# Patient Record
Sex: Male | Born: 1935
Health system: Southern US, Community
[De-identification: ages and names within clinical notes are randomized; demographics above are authoritative.]

## PROBLEM LIST (undated history)

## (undated) DIAGNOSIS — M1990 Unspecified osteoarthritis, unspecified site: Secondary | ICD-10-CM

## (undated) DIAGNOSIS — N189 Chronic kidney disease, unspecified: Secondary | ICD-10-CM

## (undated) DIAGNOSIS — E7800 Pure hypercholesterolemia, unspecified: Secondary | ICD-10-CM

## (undated) DIAGNOSIS — I219 Acute myocardial infarction, unspecified: Secondary | ICD-10-CM

## (undated) DIAGNOSIS — Z978 Presence of other specified devices: Secondary | ICD-10-CM

## (undated) DIAGNOSIS — I10 Essential (primary) hypertension: Secondary | ICD-10-CM

## (undated) DIAGNOSIS — K219 Gastro-esophageal reflux disease without esophagitis: Secondary | ICD-10-CM

## (undated) DIAGNOSIS — E039 Hypothyroidism, unspecified: Secondary | ICD-10-CM

## (undated) DIAGNOSIS — K5900 Constipation, unspecified: Secondary | ICD-10-CM

## (undated) HISTORY — PX: CARDIAC CATHETERIZATION: SHX172

## (undated) HISTORY — PX: CORONARY ANGIOPLASTY: SHX604

## (undated) HISTORY — PX: BACK SURGERY: SHX140

## (undated) HISTORY — PX: APPENDECTOMY: SHX54

## (undated) HISTORY — PX: SKIN SURGERY: SHX2413

---

## 1997-05-10 ENCOUNTER — Inpatient Hospital Stay (HOSPITAL_COMMUNITY): Admission: EM | Admit: 1997-05-10 | Discharge: 1997-05-12 | Payer: Self-pay | Admitting: Emergency Medicine

## 2000-12-13 ENCOUNTER — Ambulatory Visit (HOSPITAL_COMMUNITY): Admission: RE | Admit: 2000-12-13 | Discharge: 2000-12-13 | Payer: Self-pay | Admitting: Internal Medicine

## 2007-01-20 DIAGNOSIS — I219 Acute myocardial infarction, unspecified: Secondary | ICD-10-CM

## 2007-01-20 HISTORY — DX: Acute myocardial infarction, unspecified: I21.9

## 2012-11-11 ENCOUNTER — Other Ambulatory Visit: Payer: Self-pay | Admitting: Neurological Surgery

## 2012-11-14 ENCOUNTER — Other Ambulatory Visit: Payer: Self-pay | Admitting: Neurological Surgery

## 2012-11-14 DIAGNOSIS — M48061 Spinal stenosis, lumbar region without neurogenic claudication: Secondary | ICD-10-CM

## 2012-11-15 ENCOUNTER — Ambulatory Visit
Admission: RE | Admit: 2012-11-15 | Discharge: 2012-11-15 | Disposition: A | Discharge status: Home / Self Care | Payer: Medicare Other | Source: Ambulatory Visit | Attending: Neurological Surgery | Admitting: Neurological Surgery

## 2012-11-15 DIAGNOSIS — M48061 Spinal stenosis, lumbar region without neurogenic claudication: Secondary | ICD-10-CM

## 2012-11-23 ENCOUNTER — Encounter (HOSPITAL_COMMUNITY): Payer: Self-pay

## 2012-11-25 ENCOUNTER — Encounter (HOSPITAL_COMMUNITY): Payer: Self-pay

## 2012-11-25 ENCOUNTER — Encounter (HOSPITAL_COMMUNITY)
Admission: RE | Admit: 2012-11-25 | Discharge: 2012-11-25 | Disposition: A | Discharge status: Home / Self Care | Payer: Medicare Other | Source: Ambulatory Visit | Attending: Neurological Surgery | Admitting: Neurological Surgery

## 2012-11-25 ENCOUNTER — Encounter (HOSPITAL_COMMUNITY)
Admission: RE | Admit: 2012-11-25 | Discharge: 2012-11-25 | Disposition: A | Discharge status: Home / Self Care | Payer: Medicare Other | Source: Ambulatory Visit | Attending: Anesthesiology | Admitting: Anesthesiology

## 2012-11-25 DIAGNOSIS — Z01810 Encounter for preprocedural cardiovascular examination: Secondary | ICD-10-CM | POA: Insufficient documentation

## 2012-11-25 DIAGNOSIS — Z01812 Encounter for preprocedural laboratory examination: Secondary | ICD-10-CM | POA: Insufficient documentation

## 2012-11-25 DIAGNOSIS — Z01818 Encounter for other preprocedural examination: Secondary | ICD-10-CM | POA: Insufficient documentation

## 2012-11-25 HISTORY — DX: Essential (primary) hypertension: I10

## 2012-11-25 HISTORY — DX: Acute myocardial infarction, unspecified: I21.9

## 2012-11-25 HISTORY — DX: Hypothyroidism, unspecified: E03.9

## 2012-11-25 HISTORY — DX: Pure hypercholesterolemia, unspecified: E78.00

## 2012-11-25 LAB — SURGICAL PCR SCREEN: Staphylococcus aureus: NEGATIVE

## 2012-11-25 LAB — TYPE AND SCREEN
ABO/RH(D): O POS
Antibody Screen: NEGATIVE

## 2012-11-25 LAB — BASIC METABOLIC PANEL
BUN: 20 mg/dL (ref 6–23)
CO2: 26 mEq/L (ref 19–32)
Chloride: 103 mEq/L (ref 96–112)
Creatinine, Ser: 1.33 mg/dL (ref 0.50–1.35)
Potassium: 4 mEq/L (ref 3.5–5.1)

## 2012-11-25 LAB — CBC
HCT: 44.5 % (ref 39.0–52.0)
Hemoglobin: 15.6 g/dL (ref 13.0–17.0)
MCH: 32.6 pg (ref 26.0–34.0)
MCHC: 35.1 g/dL (ref 30.0–36.0)
MCV: 93.1 fL (ref 78.0–100.0)
Platelets: 241 10*3/uL (ref 150–400)
RBC: 4.78 MIL/uL (ref 4.22–5.81)
RDW: 12.3 % (ref 11.5–15.5)
WBC: 7.6 10*3/uL (ref 4.0–10.5)

## 2012-11-25 LAB — ABO/RH: ABO/RH(D): O POS

## 2012-11-25 NOTE — Pre-Procedure Instructions (Signed)
Ronnie Gregory  11/25/2012   Your procedure is scheduled on:  November 18  Report to [facility] Entrance "A" [address] at [employer] AM.  Call this number if you have problems the morning of surgery: [phone]   Remember:   Do not eat food or drink liquids after midnight.   Take these medicines the morning of surgery with A SIP OF WATER: Levothyroxine, Metoprolol   STOP Fish Oil and Aspirin Tuesday November 11   STOP Aspirin, Aleve, Naproxen, Advil, Ibuprofen, Vitamin, Herbs, or Supplements starting November 11   Do not wear jewelry, make-up or nail polish.  Do not wear lotions, powders, or perfumes. You may wear deodorant.  Do not shave 48 hours prior to surgery. Men may shave face and neck.  Do not bring valuables to the hospital.  [facility] is not responsible                  for any belongings or valuables.               Contacts, dentures or bridgework may not be worn into surgery.  Leave suitcase in the car. After surgery it may be brought to your room.  For patients admitted to the hospital, discharge time is determined by your                treatment team.               Special Instructions: Shower using CHG 2 nights before surgery and the night before surgery.  If you shower the day of surgery use CHG.  Use special wash - you have one bottle of CHG for all showers.  You should use approximately 1/3 of the bottle for each shower.   Please read over the following fact sheets that you were given: Pain Booklet, Coughing and Deep Breathing, Blood Transfusion Information and Surgical Site Infection Prevention

## 2012-11-25 NOTE — Progress Notes (Signed)
Patient told to stop Cascara and take colace starting November 11

## 2012-11-28 NOTE — Progress Notes (Addendum)
Anesthesia chart review:  Patient is a 79 year old male scheduled for L2-3 PLIF on 12/06/12 by Dr. Danielle Dess.    History includes non-smoker, HTN, hypercholesterolemia, hypothyroidism, prior back surgery ~ 18 years ago, CAD/MI s/p angioplasty/stents X 2 ~ 3 years ago in [place].  Cardiologist is Dr. Isaias Cowman in [place], records re-requested but are still pending.  I did call patient this afternoon.  He reports that he has not seen Dr. Larna Daughters is a few years.  He lives in [place], but sees a PCP is [place] Jillyn [place], PA-C ?) on a "regular basis."  Preoperative CXR and labs noted.  EKG on 11/25/12 showed NSR.  Echo on 04/28/10 from [facility]) showed overall normal to hyperdynamic LV systolic function, LVH, no obvious regional wall motion abnormalities, trivial AR, mild MR/TR, trivial PR.  Stress echo on 08/02/10 was negative.     No cardiac cath received from [facility].  Although patient is fairly certain that this is where his two stents were placed. (I'll re-request to be sure.)   He denies chest pain, SOB at rest, new edema.  It was difficult for him to give me any specifics, but he said he could recently do yard work (although rides a Surveyor, mining), walk up a flight of stairs.  I did tell Mr. Enis we would likely need a clearance for this procedure--possible from his PCP but also maybe from cardiology.  I reviewed above with anesthesiologist Dr. Ivin Booty.  He did recommend cardiology preoperative evaluation since it has been probably > 2 years since he has been evaluated.  I'll review any additional cardiology records if received.  I'll notify Shanda Bumps at Dr. Verlee Rossetti office of need for cardiology evaluation.  Velna Ochs [facility] Short Stay Center/Anesthesiology Phone [phone] 11/28/2012 6:12 PM  Addendum: 11/29/2012 4:00 PM Patient has appointment with Dr. Purvis Sheffield with [facility] in [place] on 11/22/12.  I have faxed available records from [facility]  to that office.  I did receive prior cardiac cath and last office note (04/06/11) from Dr. Isaias Cowman.    Cardiac cath on 04/29/10 showed CAD (LAD -30%, LCX no obstructive disease, RCA serial mid and distal > 90% stenosis), overall normal LV systolic function, EF > 50%, inferior hypokinesis, elevated LVEDP. He is s/p DES to RCA 04/2010.   Carotid duplex on 04/10/11 showed 30% bilateral CCC, no significant ICA stenosis, antegrade vertebral flow.  Abdominal ultrasound in 04/2010 showed no evidence of AAA.   Addendum: 12/02/2012 3:25 PM Patient was seen by cardiologist Dr. Purvis Sheffield today. Because patient was asymptomatic and could perform at least 4 METS, no non-invasive testing was felt warranted at this time. He recommended continuing lisinopril, metoprolol, and pravastatin with plans to resume ASA asap post-operatively.  Six month cardiology follow-up is planned.

## 2012-11-30 ENCOUNTER — Encounter: Payer: Self-pay | Admitting: Cardiology

## 2012-11-30 NOTE — Progress Notes (Signed)
[facility] in [place] @[phone] called and pt has appointment for Nov 14th.

## 2012-12-02 ENCOUNTER — Ambulatory Visit (INDEPENDENT_AMBULATORY_CARE_PROVIDER_SITE_OTHER): Payer: Medicare Other | Admitting: Cardiovascular Disease

## 2012-12-02 VITALS — BP 153/88 | HR 86 | Ht 70.0 in | Wt 195.1 lb

## 2012-12-02 DIAGNOSIS — E785 Hyperlipidemia, unspecified: Secondary | ICD-10-CM

## 2012-12-02 DIAGNOSIS — I2510 Atherosclerotic heart disease of native coronary artery without angina pectoris: Secondary | ICD-10-CM

## 2012-12-02 DIAGNOSIS — Z955 Presence of coronary angioplasty implant and graft: Secondary | ICD-10-CM | POA: Insufficient documentation

## 2012-12-02 DIAGNOSIS — Z9861 Coronary angioplasty status: Secondary | ICD-10-CM

## 2012-12-02 DIAGNOSIS — I10 Essential (primary) hypertension: Secondary | ICD-10-CM

## 2012-12-02 DIAGNOSIS — Z01818 Encounter for other preprocedural examination: Secondary | ICD-10-CM

## 2012-12-02 NOTE — Patient Instructions (Addendum)
   Resume Pravastatin, Lisinopril, & Metoprolol at your present dose today Continue all other medications.    Continue to hold Aspirin for now & resume as soon as surgeon advises after surgery Your physician wants you to follow up in: 6 months.  You will receive a reminder letter in the mail one-two months in advance.  If you don't receive a letter, please call our office to schedule the follow up appointment

## 2012-12-02 NOTE — Progress Notes (Signed)
Patient ID: Ronnie Gregory, male   DOB: 1933/10/04, 79 y.o.   MRN: [id number]       CARDIOLOGY CONSULT NOTE  Patient ID: Ronnie Gregory MRN: [id number] DOB/AGE: Dec 28, 1933 79 y.o.  Admit date: (Not on file) Primary Physician No primary provider on file.  Reason for Consultation: CAD, preop clearance  HPI: The patient is a 79 year old man with a history of coronary artery disease who reportedly had 2 stents placed approximately 2.5 years ago. He underwent a negative stress echocardiogram and walked for 6 minutes on the Bruce protocol achieving 7 METS on 08/02/2010. Cardiac catheterization report from 04/29/2010 revealed a moderate sized LAD with proximal calcification and mild diffuse nonobstructive disease up to 30%. The left circumflex coronary artery was moderate in size with no obstructive stenosis. The RCA was a large dominant vessel with a long subtotal 90% stenosis in the mid vessel stenosis and a focal distal subtotal 90% stenosis. There appeared to be competitive flow from left-sided collaterals. Left ventricular systolic function was reportedly normal with inferior hypokinesis. An echocardiogram performed on 04/28/2010 showed left into a systolic function to be normal to hyperdynamic with evidence of left ventricular hypertrophy and mild mitral and tricuspid regurgitation with no regional wall motion abnormalities.   He is apparently being scheduled for an L2-3 posterior lumbar interbody fusion to be performed on 12/06/2012. He has a history of a previous decompression and fusion from L3 down to L5.  He had been taking aspirin, lisinopril, metoprolol and pravastatin, but he was apparently instructed to stop all of his medications and thus he is not taking anything at this present time. His heart rate is 86 beats per minute and his blood pressure is 153/88 mmHg.  He denies chest pain, palpitations and shortness of breath. He also denies leg swelling and syncope. He remains very  active at home and splits wood. He used to be a Curator for a living.  He has 16 stairs at home and climbs them 5-6 times a day without experiencing any chest pain, shortness of breath or palpitations.   No Known Allergies  Current Outpatient Prescriptions  Medication Sig Dispense Refill  . aspirin EC 81 MG tablet Take 81 mg by mouth daily.      . CASCARA SAGRADA PO Take 1 tablet by mouth at bedtime.      Marland Kitchen levothyroxine (SYNTHROID, LEVOTHROID) 100 MCG tablet Take 100 mcg by mouth daily before breakfast.      . lisinopril (PRINIVIL,ZESTRIL) 2.5 MG tablet Take 2.5 mg by mouth daily.      . metoprolol tartrate (LOPRESSOR) 25 MG tablet Take 12.5 mg by mouth 2 (two) times daily.      . Omega-3 Fatty Acids (FISH OIL) 600 MG CAPS Take 1,800 mg by mouth daily.      . pravastatin (PRAVACHOL) 40 MG tablet Take 40 mg by mouth at bedtime.       No current facility-administered medications for this visit.    Past Medical History  Diagnosis Date  . Hypertension   . Myocardial infarction     Dr. Caryn Section cardiologist  . Hypothyroidism   . Hypercholesteremia     Past Surgical History  Procedure Laterality Date  . Cardiac catheterization    . Coronary angioplasty      3 years ago  . Back surgery      x 3    History   Social History  . Marital Status: Married    Spouse Name: N/A  Number of Children: N/A  . Years of Education: N/A   Occupational History  . Not on file.   Social History Main Topics  . Smoking status: Never Smoker   . Smokeless tobacco: Not on file  . Alcohol Use: No  . Drug Use: No  . Sexual Activity: Not on file   Other Topics Concern  . Not on file   Social History Narrative  . No narrative on file     No family history on file.   Prior to Admission medications   Medication Sig Start Date End Date Taking? Authorizing Provider  aspirin EC 81 MG tablet Take 81 mg by mouth daily.    Historical Provider, MD  CASCARA SAGRADA PO Take 1 tablet  by mouth at bedtime.    Historical Provider, MD  levothyroxine (SYNTHROID, LEVOTHROID) 100 MCG tablet Take 100 mcg by mouth daily before breakfast.    Historical Provider, MD  lisinopril (PRINIVIL,ZESTRIL) 2.5 MG tablet Take 2.5 mg by mouth daily.    Historical Provider, MD  metoprolol tartrate (LOPRESSOR) 25 MG tablet Take 12.5 mg by mouth 2 (two) times daily.    Historical Provider, MD  Omega-3 Fatty Acids (FISH OIL) 600 MG CAPS Take 1,800 mg by mouth daily.    Historical Provider, MD  pravastatin (PRAVACHOL) 40 MG tablet Take 40 mg by mouth at bedtime.    Historical Provider, MD     Review of systems complete and found to be negative unless listed above in HPI     Physical exam  General: NAD Neck: No JVD, no thyromegaly or thyroid nodule.  Lungs: Clear to auscultation bilaterally with normal respiratory effort. CV: Nondisplaced PMI.  Heart regular S1/S2, no S3/S4, no murmur.  No peripheral edema.  No carotid bruit.  Normal pedal pulses.  Abdomen: Soft, nontender, no hepatosplenomegaly, no distention.  Skin: Intact without lesions or rashes.  Neurologic: Alert and oriented x 3.  Psych: Normal affect. Extremities: No clubbing or cyanosis.  HEENT: Normal.   Labs:   Lab Results  Component Value Date   WBC 7.6 11/25/2012   HGB 15.6 11/25/2012   HCT 44.5 11/25/2012   MCV 93.1 11/25/2012   PLT 241 11/25/2012    Recent Labs Lab 11/25/12 1422  NA 138  K 4.0  CL 103  CO2 26  BUN 20  CREATININE 1.33  CALCIUM 9.2  GLUCOSE 96   No results found for this basename: CKTOTAL, CKMB, CKMBINDEX, TROPONINI    No results found for this basename: CHOL   No results found for this basename: HDL   No results found for this basename: LDLCALC   No results found for this basename: TRIG   No results found for this basename: CHOLHDL   No results found for this basename: LDLDIRECT       EKG: Sinus rhythm, rate 66 bpm, axis within normal limits, intervals within normal limits, no acute  ST-T wave changes.  Studies: No results found.  ASSESSMENT AND PLAN: 1. Preoperative risk stratification: given that he is asymptomatic with regular exertion, including climbing stairs several times a day, it appears he is able to perform between 4-10 METS. Thus, his likelihood of experiencing a major adverse cardiovascular event in the perioperative period is probably low. However I would recommend that he commence taking lisinopril, metoprolol, and pravastatin today. While his aspirin has been held, I would strongly encourage him to begin taking it immediately after surgery. I would prefer that he take it throughout the perioperative period  given the fact that he has multiple stents. The purpose of metoprolol is specifically to reduce his risk for a perioperative myocardial infarction or ventricular dysrhythmia. The purpose of the pravastatin is for its pleiotropic effects and thus attenuating endothelial dysfunction. His lisinopril should be maintained to control his blood pressure, as it is mildly elevated today. I do not feel any noninvasive testing is warranted at this time. 2. CAD with stents: would continue ASA, metoprolol, pravastatin, and lisinopril as mentioned above. 3. Hypertension: elevated today. Would restart lisinopril and metoprolol. 4. Hyperlipidemia: on pravastatin. Will need a lipid panel in the future. I will check to see if one has been recently completed.  Dispo: f/u 6 months.  Signed: Prentice Docker, M.D., F.A.C.C.  12/02/2012, 1:50 PM

## 2012-12-05 MED ORDER — CEFAZOLIN SODIUM-DEXTROSE 2-3 GM-% IV SOLR
2.0000 g | INTRAVENOUS | Status: AC
  Administered 2012-12-06: 2 g via INTRAVENOUS
  Filled 2012-12-05: qty 50

## 2012-12-05 NOTE — H&P (Signed)
CHIEF COMPLAINT:                                          Back pain and leg discomfort when he walks.  HISTORY OF PRESENT ILLNESS:                     [name] who I had seen 17 years ago.  He underwent decompression and fusion for severe stenosis at L3-L4.  The patient did quite well from that and I have not seen him in a number of years, but he notes that in the past couple of years, he has had progressive increase in back pain and difficulty with walking.  He notes that he cannot enjoy walking like he used to because he has increasing pain.  His daughter, who accompanies him notes that he has had a progressive decrease in function such that he has great difficulty even doing activities of daily living or any traveling or about to do the shopping for his family.  He is involved in the care of his wife who has some Alzheimer's disease and he has been becoming increasingly debilitated by this process.  He had seen some specialists in [place] and apparently had an MRI.  He also had an MRI of his lumbar spine in October 2013 at [facility].  I obtained that study on the [facility] and it demonstrates that he has a solid fusion at L3-L4 with a widely patent spinal canal from L3 on down, but at L2-L3, he has severe stenosis secondary to a combination of spondylosis and marked hypertrophy of the facet joints and bulging of the disc in the posterior aspect.  This causes focal severe central stenosis.  He has had physical therapy in [place], which had been of little use in regards to his back.  REVIEW OF SYSTEMS:                                    Notable for wearing of glasses, leg pain, back pain, leg weakness on a 14-point review sheet.  PAST MEDICAL HISTORY:    Current Medical Conditions:  Reveals his general health has been good.  He does have some hypertension.    Prior Operations:  He had a stent placed a couple of years ago in his  heart.    Medications and Allergies:  Currently, he is taking metoprolol, lisinopril, levothyroxine, aspirin, fish oil supplement, and pravastatin.  He also uses baby aspirin per day.  FAMILY HISTORY:                                            Noncontributory.  SOCIAL HISTORY:                                            Reveals he does not smoke or use alcohol.  PHYSICAL EXAMINATION:  Height and weight have been stable at 5 feet 9 inches and 175 pounds.  On physical examination, I note that he stands straight and erect, but initially he tends to favor a 5 to 10-degree forward stoop.  He walks about very slowly and cautiously and as he walks progressively, his stride increases and the length of his gait increases and the speed of his gait also increases.  After a while, he is able to balance on to his toes and on to his heels without difficulty.  Deep tendon reflexes are trace in the patellae and absent in both Achilles.  Sensation appears grossly intact.  IMPRESSION/PLAN:                                         At this point, I believe that the patient has severe stenosis at L2-3 above his previous fusion.  I have advised that he ultimately needs to undergo surgical decompression.  With regard to further conservative management, he has had a trial of a couple of steroid injections, but these provided minimal relief.  I do not believe that physical therapy is going to improve this condition.  The patient does have adjacent level disease at the L2-3 level above his fusion and I believe that he would be best served with decompression and fusion at L2-L3 with removal of the hardware at L3-4.  new MRI confirms the presence of severe stenosis at L2-L3. He is admitted for surgery now

## 2012-12-06 ENCOUNTER — Inpatient Hospital Stay (HOSPITAL_COMMUNITY)
Admission: RE | Admit: 2012-12-06 | Discharge: 2012-12-09 | DRG: 460 | Disposition: A | Discharge status: Home / Self Care | Payer: Medicare Other | Source: Ambulatory Visit | Attending: Neurological Surgery | Admitting: Neurological Surgery

## 2012-12-06 ENCOUNTER — Encounter (HOSPITAL_COMMUNITY): Payer: Medicare Other | Admitting: Vascular Surgery

## 2012-12-06 ENCOUNTER — Encounter (HOSPITAL_COMMUNITY): Payer: Self-pay | Admitting: Surgery

## 2012-12-06 ENCOUNTER — Inpatient Hospital Stay (HOSPITAL_COMMUNITY): Payer: Medicare Other | Admitting: Certified Registered"

## 2012-12-06 ENCOUNTER — Inpatient Hospital Stay (HOSPITAL_COMMUNITY): Payer: Medicare Other

## 2012-12-06 ENCOUNTER — Encounter (HOSPITAL_COMMUNITY)
Admission: RE | Disposition: A | Discharge status: Home / Self Care | Payer: Medicare Other | Source: Ambulatory Visit | Attending: Neurological Surgery

## 2012-12-06 DIAGNOSIS — Z79899 Other long term (current) drug therapy: Secondary | ICD-10-CM

## 2012-12-06 DIAGNOSIS — M47817 Spondylosis without myelopathy or radiculopathy, lumbosacral region: Principal | ICD-10-CM | POA: Diagnosis present

## 2012-12-06 DIAGNOSIS — Z981 Arthrodesis status: Secondary | ICD-10-CM

## 2012-12-06 DIAGNOSIS — E039 Hypothyroidism, unspecified: Secondary | ICD-10-CM | POA: Diagnosis present

## 2012-12-06 DIAGNOSIS — M4716 Other spondylosis with myelopathy, lumbar region: Secondary | ICD-10-CM

## 2012-12-06 DIAGNOSIS — I252 Old myocardial infarction: Secondary | ICD-10-CM

## 2012-12-06 DIAGNOSIS — I10 Essential (primary) hypertension: Secondary | ICD-10-CM | POA: Diagnosis present

## 2012-12-06 DIAGNOSIS — Z7982 Long term (current) use of aspirin: Secondary | ICD-10-CM

## 2012-12-06 DIAGNOSIS — Z472 Encounter for removal of internal fixation device: Secondary | ICD-10-CM

## 2012-12-06 DIAGNOSIS — I2510 Atherosclerotic heart disease of native coronary artery without angina pectoris: Secondary | ICD-10-CM | POA: Diagnosis present

## 2012-12-06 DIAGNOSIS — Z9861 Coronary angioplasty status: Secondary | ICD-10-CM

## 2012-12-06 SURGERY — POSTERIOR LUMBAR FUSION 1 LEVEL
Anesthesia: General | Site: Back | Wound class: Clean

## 2012-12-06 MED ORDER — ACETAMINOPHEN 325 MG PO TABS
650.0000 mg | ORAL_TABLET | ORAL | Status: DC | PRN
  Administered 2012-12-07: 650 mg via ORAL
  Filled 2012-12-06: qty 2

## 2012-12-06 MED ORDER — MORPHINE SULFATE 2 MG/ML IJ SOLN
1.0000 mg | INTRAMUSCULAR | Status: DC | PRN
  Filled 2012-12-06: qty 1

## 2012-12-06 MED ORDER — SODIUM CHLORIDE 0.9 % IV SOLN
INTRAVENOUS | Status: DC | PRN
  Administered 2012-12-06: 13:00:00 via INTRAVENOUS

## 2012-12-06 MED ORDER — FENTANYL CITRATE 0.05 MG/ML IJ SOLN
INTRAMUSCULAR | Status: DC | PRN
  Administered 2012-12-06: 100 ug via INTRAVENOUS
  Administered 2012-12-06: 25 ug via INTRAVENOUS

## 2012-12-06 MED ORDER — DEXTROSE 5 % IV SOLN
500.0000 mg | Freq: Four times a day (QID) | INTRAVENOUS | Status: DC | PRN
  Administered 2012-12-06: 500 mg via INTRAVENOUS
  Filled 2012-12-06: qty 5

## 2012-12-06 MED ORDER — NEOSTIGMINE METHYLSULFATE 1 MG/ML IJ SOLN
INTRAMUSCULAR | Status: DC | PRN
  Administered 2012-12-06: 5 mg via INTRAVENOUS

## 2012-12-06 MED ORDER — SIMVASTATIN 20 MG PO TABS
20.0000 mg | ORAL_TABLET | Freq: Every day | ORAL | Status: DC
  Administered 2012-12-07 – 2012-12-08 (×2): 20 mg via ORAL
  Filled 2012-12-06 (×3): qty 1

## 2012-12-06 MED ORDER — BISACODYL 10 MG RE SUPP
10.0000 mg | Freq: Every day | RECTAL | Status: DC | PRN
  Administered 2012-12-08: 10 mg via RECTAL
  Filled 2012-12-06: qty 1

## 2012-12-06 MED ORDER — ONDANSETRON HCL 4 MG/2ML IJ SOLN: 4.0000 mg | Freq: Four times a day (QID) | INTRAMUSCULAR | Status: DC | PRN

## 2012-12-06 MED ORDER — POLYETHYLENE GLYCOL 3350 17 G PO PACK
17.0000 g | PACK | Freq: Every day | ORAL | Status: DC | PRN
  Filled 2012-12-06: qty 1

## 2012-12-06 MED ORDER — 0.9 % SODIUM CHLORIDE (POUR BTL) OPTIME
TOPICAL | Status: DC | PRN
  Administered 2012-12-06: 1000 mL

## 2012-12-06 MED ORDER — PROPOFOL 10 MG/ML IV BOLUS
INTRAVENOUS | Status: DC | PRN
  Administered 2012-12-06: 160 mg via INTRAVENOUS

## 2012-12-06 MED ORDER — OXYCODONE HCL 5 MG PO TABS
ORAL_TABLET | ORAL | Status: AC
  Administered 2012-12-06: 5 mg
  Filled 2012-12-06: qty 1

## 2012-12-06 MED ORDER — PHENOL 1.4 % MT LIQD: 1.0000 | OROMUCOSAL | Status: DC | PRN

## 2012-12-06 MED ORDER — SENNA 8.6 MG PO TABS
1.0000 | ORAL_TABLET | Freq: Two times a day (BID) | ORAL | Status: DC
  Administered 2012-12-06 – 2012-12-09 (×6): 8.6 mg via ORAL
  Filled 2012-12-06 (×7): qty 1

## 2012-12-06 MED ORDER — HYDROMORPHONE HCL PF 1 MG/ML IJ SOLN
0.2500 mg | INTRAMUSCULAR | Status: DC | PRN
  Administered 2012-12-06 (×2): 0.5 mg via INTRAVENOUS

## 2012-12-06 MED ORDER — LEVOTHYROXINE SODIUM 100 MCG PO TABS
100.0000 ug | ORAL_TABLET | Freq: Every day | ORAL | Status: DC
  Administered 2012-12-07 – 2012-12-09 (×3): 100 ug via ORAL
  Filled 2012-12-06 (×5): qty 1

## 2012-12-06 MED ORDER — ARTIFICIAL TEARS OP OINT
TOPICAL_OINTMENT | OPHTHALMIC | Status: DC | PRN
  Administered 2012-12-06: 1 via OPHTHALMIC

## 2012-12-06 MED ORDER — THROMBIN 20000 UNITS EX SOLR
CUTANEOUS | Status: DC | PRN
  Administered 2012-12-06: 10:00:00 via TOPICAL

## 2012-12-06 MED ORDER — ACETAMINOPHEN 650 MG RE SUPP: 650.0000 mg | RECTAL | Status: DC | PRN

## 2012-12-06 MED ORDER — BUPIVACAINE HCL (PF) 0.5 % IJ SOLN
INTRAMUSCULAR | Status: DC | PRN
  Administered 2012-12-06: 10 mL

## 2012-12-06 MED ORDER — PHENYLEPHRINE HCL 10 MG/ML IJ SOLN
10.0000 mg | INTRAVENOUS | Status: DC | PRN
  Administered 2012-12-06: 20 ug/min via INTRAVENOUS

## 2012-12-06 MED ORDER — ALUM & MAG HYDROXIDE-SIMETH 200-200-20 MG/5ML PO SUSP
30.0000 mL | Freq: Four times a day (QID) | ORAL | Status: DC | PRN
  Administered 2012-12-08: 30 mL via ORAL
  Filled 2012-12-06: qty 30

## 2012-12-06 MED ORDER — OXYCODONE HCL 5 MG/5ML PO SOLN: 5.0000 mg | Freq: Once | ORAL | Status: DC | PRN

## 2012-12-06 MED ORDER — CEFAZOLIN SODIUM 1-5 GM-% IV SOLN
1.0000 g | Freq: Three times a day (TID) | INTRAVENOUS | Status: AC
  Administered 2012-12-06 – 2012-12-07 (×2): 1 g via INTRAVENOUS
  Filled 2012-12-06 (×3): qty 50

## 2012-12-06 MED ORDER — SODIUM CHLORIDE 0.9 % IR SOLN
Status: DC | PRN
  Administered 2012-12-06: 10:00:00

## 2012-12-06 MED ORDER — GLYCOPYRROLATE 0.2 MG/ML IJ SOLN
INTRAMUSCULAR | Status: DC | PRN
  Administered 2012-12-06: .8 mg via INTRAVENOUS

## 2012-12-06 MED ORDER — ONDANSETRON HCL 4 MG/2ML IJ SOLN
4.0000 mg | INTRAMUSCULAR | Status: DC | PRN
  Administered 2012-12-08 – 2012-12-09 (×2): 4 mg via INTRAVENOUS
  Filled 2012-12-06 (×2): qty 2

## 2012-12-06 MED ORDER — CASCARA SAGRADA 1 GM/ML PO EXTR: Freq: Every day | ORAL | Status: DC

## 2012-12-06 MED ORDER — SODIUM CHLORIDE 0.9 % IJ SOLN
3.0000 mL | Freq: Two times a day (BID) | INTRAMUSCULAR | Status: DC
  Administered 2012-12-06 – 2012-12-09 (×4): 3 mL via INTRAVENOUS

## 2012-12-06 MED ORDER — LISINOPRIL 2.5 MG PO TABS
2.5000 mg | ORAL_TABLET | Freq: Every day | ORAL | Status: DC
  Administered 2012-12-08 – 2012-12-09 (×2): 2.5 mg via ORAL
  Filled 2012-12-06 (×3): qty 1

## 2012-12-06 MED ORDER — SODIUM CHLORIDE 0.9 % IJ SOLN: 3.0000 mL | INTRAMUSCULAR | Status: DC | PRN

## 2012-12-06 MED ORDER — HYDROMORPHONE HCL PF 1 MG/ML IJ SOLN
INTRAMUSCULAR | Status: AC
  Filled 2012-12-06: qty 1

## 2012-12-06 MED ORDER — EPHEDRINE SULFATE 50 MG/ML IJ SOLN
INTRAMUSCULAR | Status: DC | PRN
  Administered 2012-12-06: 5 mg via INTRAVENOUS
  Administered 2012-12-06: 10 mg via INTRAVENOUS
  Administered 2012-12-06: 15 mg via INTRAVENOUS
  Administered 2012-12-06 (×2): 10 mg via INTRAVENOUS

## 2012-12-06 MED ORDER — VECURONIUM BROMIDE 10 MG IV SOLR
INTRAVENOUS | Status: DC | PRN
  Administered 2012-12-06 (×4): 2 mg via INTRAVENOUS

## 2012-12-06 MED ORDER — DOCUSATE SODIUM 100 MG PO CAPS
100.0000 mg | ORAL_CAPSULE | Freq: Two times a day (BID) | ORAL | Status: DC
  Administered 2012-12-06 – 2012-12-09 (×4): 100 mg via ORAL
  Filled 2012-12-06 (×5): qty 1

## 2012-12-06 MED ORDER — ALBUMIN HUMAN 5 % IV SOLN
INTRAVENOUS | Status: DC | PRN
  Administered 2012-12-06: 12:00:00 via INTRAVENOUS

## 2012-12-06 MED ORDER — METOPROLOL TARTRATE 12.5 MG HALF TABLET
12.5000 mg | ORAL_TABLET | Freq: Two times a day (BID) | ORAL | Status: DC
  Administered 2012-12-06 – 2012-12-09 (×5): 12.5 mg via ORAL
  Filled 2012-12-06 (×7): qty 1

## 2012-12-06 MED ORDER — KETOROLAC TROMETHAMINE 15 MG/ML IJ SOLN
15.0000 mg | Freq: Four times a day (QID) | INTRAMUSCULAR | Status: AC
  Administered 2012-12-06 – 2012-12-07 (×5): 15 mg via INTRAVENOUS
  Filled 2012-12-06 (×6): qty 1

## 2012-12-06 MED ORDER — LIDOCAINE-EPINEPHRINE 1 %-1:100000 IJ SOLN
INTRAMUSCULAR | Status: DC | PRN
  Administered 2012-12-06: 10 mL

## 2012-12-06 MED ORDER — ROCURONIUM BROMIDE 100 MG/10ML IV SOLN
INTRAVENOUS | Status: DC | PRN
  Administered 2012-12-06: 50 mg via INTRAVENOUS

## 2012-12-06 MED ORDER — OXYCODONE HCL 5 MG PO TABS
ORAL_TABLET | ORAL | Status: AC
  Filled 2012-12-06: qty 1

## 2012-12-06 MED ORDER — MENTHOL 3 MG MT LOZG
1.0000 | LOZENGE | OROMUCOSAL | Status: DC | PRN
  Administered 2012-12-09: 3 mg via ORAL

## 2012-12-06 MED ORDER — FLEET ENEMA 7-19 GM/118ML RE ENEM: 1.0000 | ENEMA | Freq: Once | RECTAL | Status: AC | PRN

## 2012-12-06 MED ORDER — ONDANSETRON HCL 4 MG/2ML IJ SOLN
INTRAMUSCULAR | Status: DC | PRN
  Administered 2012-12-06: 4 mg via INTRAVENOUS

## 2012-12-06 MED ORDER — SODIUM CHLORIDE 0.9 % IV SOLN: 250.0000 mL | INTRAVENOUS | Status: DC

## 2012-12-06 MED ORDER — LACTATED RINGERS IV SOLN
INTRAVENOUS | Status: DC
  Administered 2012-12-06 (×2): via INTRAVENOUS

## 2012-12-06 MED ORDER — OXYCODONE-ACETAMINOPHEN 5-325 MG PO TABS
1.0000 | ORAL_TABLET | ORAL | Status: DC | PRN
  Administered 2012-12-07: 2 via ORAL
  Administered 2012-12-07 (×2): 1 via ORAL
  Administered 2012-12-08: 2 via ORAL
  Filled 2012-12-06: qty 2
  Filled 2012-12-06: qty 1
  Filled 2012-12-06 (×2): qty 2

## 2012-12-06 MED ORDER — SODIUM CHLORIDE 0.9 % IV SOLN
INTRAVENOUS | Status: DC
  Administered 2012-12-06: 1000 mL via INTRAVENOUS
  Administered 2012-12-07: 09:00:00 via INTRAVENOUS

## 2012-12-06 MED ORDER — OXYCODONE HCL 5 MG PO TABS: 5.0000 mg | ORAL_TABLET | Freq: Once | ORAL | Status: DC | PRN

## 2012-12-06 MED ORDER — METHOCARBAMOL 500 MG PO TABS
500.0000 mg | ORAL_TABLET | Freq: Four times a day (QID) | ORAL | Status: DC | PRN
  Administered 2012-12-07 – 2012-12-09 (×5): 500 mg via ORAL
  Filled 2012-12-06 (×6): qty 1

## 2012-12-06 MED ORDER — LIDOCAINE HCL (CARDIAC) 20 MG/ML IV SOLN
INTRAVENOUS | Status: DC | PRN
  Administered 2012-12-06: 80 mg via INTRAVENOUS

## 2012-12-06 SURGICAL SUPPLY — 61 items
ADH SKN CLS APL DERMABOND .7 (GAUZE/BANDAGES/DRESSINGS) ×1
BAG DECANTER FOR FLEXI CONT (MISCELLANEOUS) ×2 IMPLANT
BLADE SURG ROTATE 9660 (MISCELLANEOUS) IMPLANT
BUR MATCHSTICK NEURO 3.0 LAGG (BURR) ×2 IMPLANT
CANISTER SUCT 3000ML (MISCELLANEOUS) ×2 IMPLANT
CONT SPEC 4OZ CLIKSEAL STRL BL (MISCELLANEOUS) ×4 IMPLANT
COVER BACK TABLE 24X17X13 BIG (DRAPES) IMPLANT
COVER TABLE BACK 60X90 (DRAPES) ×2 IMPLANT
DECANTER SPIKE VIAL GLASS SM (MISCELLANEOUS) ×2 IMPLANT
DERMABOND ADVANCED (GAUZE/BANDAGES/DRESSINGS) ×1
DERMABOND ADVANCED .7 DNX12 (GAUZE/BANDAGES/DRESSINGS) ×1 IMPLANT
DRAPE C-ARM 42X72 X-RAY (DRAPES) ×4 IMPLANT
DRAPE LAPAROTOMY 100X72X124 (DRAPES) ×2 IMPLANT
DRAPE POUCH INSTRU U-SHP 10X18 (DRAPES) ×2 IMPLANT
DRAPE PROXIMA HALF (DRAPES) IMPLANT
DURAPREP 26ML APPLICATOR (WOUND CARE) ×2 IMPLANT
ELECT REM PT RETURN 9FT ADLT (ELECTROSURGICAL) ×2
ELECTRODE REM PT RTRN 9FT ADLT (ELECTROSURGICAL) ×1 IMPLANT
GAUZE SPONGE 4X4 16PLY XRAY LF (GAUZE/BANDAGES/DRESSINGS) IMPLANT
GLOVE BIOGEL PI IND STRL 8.5 (GLOVE) ×2 IMPLANT
GLOVE BIOGEL PI INDICATOR 8.5 (GLOVE) ×2
GLOVE ECLIPSE 8.0 STRL XLNG CF (GLOVE) ×1 IMPLANT
GLOVE ECLIPSE 8.5 STRL (GLOVE) ×4 IMPLANT
GLOVE EXAM NITRILE LRG STRL (GLOVE) ×1 IMPLANT
GLOVE EXAM NITRILE MD LF STRL (GLOVE) IMPLANT
GLOVE EXAM NITRILE XL STR (GLOVE) IMPLANT
GLOVE EXAM NITRILE XS STR PU (GLOVE) IMPLANT
GLOVE INDICATOR 7.0 STRL GRN (GLOVE) ×1 IMPLANT
GLOVE SURG SS PI 6.5 STRL IVOR (GLOVE) ×4 IMPLANT
GOWN BRE IMP SLV AUR LG STRL (GOWN DISPOSABLE) ×1 IMPLANT
GOWN BRE IMP SLV AUR XL STRL (GOWN DISPOSABLE) ×1 IMPLANT
GOWN STRL REIN 2XL LVL4 (GOWN DISPOSABLE) ×4 IMPLANT
HEMOSTAT POWDER KIT SURGIFOAM (HEMOSTASIS) IMPLANT
KIT BASIN OR (CUSTOM PROCEDURE TRAY) ×2 IMPLANT
KIT ROOM TURNOVER OR (KITS) ×2 IMPLANT
MILL MEDIUM DISP (BLADE) ×1 IMPLANT
NEEDLE HYPO 22GX1.5 SAFETY (NEEDLE) ×2 IMPLANT
NS IRRIG 1000ML POUR BTL (IV SOLUTION) ×2 IMPLANT
PACK FOAM VITOSS 10CC (Orthopedic Implant) ×1 IMPLANT
PACK LAMINECTOMY NEURO (CUSTOM PROCEDURE TRAY) ×2 IMPLANT
PAD ARMBOARD 7.5X6 YLW CONV (MISCELLANEOUS) ×6 IMPLANT
PATTIES SURGICAL .5 X1 (DISPOSABLE) ×2 IMPLANT
PEEK PLIF NOVEL 9X25X12 (Peek) ×2 IMPLANT
ROD 45.5X40CM (Rod) ×2 IMPLANT
SCREW 40MM (Screw) ×4 IMPLANT
SCREW SET SPINAL STD HEXALOBE (Screw) ×4 IMPLANT
SPONGE GAUZE 4X4 12PLY (GAUZE/BANDAGES/DRESSINGS) ×2 IMPLANT
SPONGE LAP 4X18 X RAY DECT (DISPOSABLE) IMPLANT
SPONGE SURGIFOAM ABS GEL 100 (HEMOSTASIS) ×2 IMPLANT
SUT VIC AB 1 CT1 18XBRD ANBCTR (SUTURE) ×1 IMPLANT
SUT VIC AB 1 CT1 8-18 (SUTURE) ×2
SUT VIC AB 2-0 CP2 18 (SUTURE) ×3 IMPLANT
SUT VIC AB 3-0 SH 8-18 (SUTURE) ×3 IMPLANT
SYR 20ML ECCENTRIC (SYRINGE) ×2 IMPLANT
SYR 3ML LL SCALE MARK (SYRINGE) ×8 IMPLANT
TOWEL OR 17X24 6PK STRL BLUE (TOWEL DISPOSABLE) ×2 IMPLANT
TOWEL OR 17X26 10 PK STRL BLUE (TOWEL DISPOSABLE) ×2 IMPLANT
TRAP SPECIMEN MUCOUS 40CC (MISCELLANEOUS) ×2 IMPLANT
TRAY FOLEY CATH 14FRSI W/METER (CATHETERS) ×1 IMPLANT
TRAY FOLEY CATH 16FRSI W/METER (SET/KITS/TRAYS/PACK) ×1 IMPLANT
WATER STERILE IRR 1000ML POUR (IV SOLUTION) ×2 IMPLANT

## 2012-12-06 NOTE — Progress Notes (Signed)
PHARMACIST - PHYSICIAN ORDER COMMUNICATION  CONCERNING: P&T Medication Policy on Herbal Medications  DESCRIPTION:  This patient's order for:  Ronnie Gregory  has been noted.  This product(s) is classified as an "herbal" or natural product. Due to a lack of definitive safety studies or FDA approval, nonstandard manufacturing practices, plus the potential risk of unknown drug-drug interactions while on inpatient medications, the Pharmacy and Therapeutics Committee does not permit the use of "herbal" or natural products of this type within [facility].   ACTION TAKEN: The pharmacy department is unable to verify this order at this time and your patient has been informed of this safety policy. Please reevaluate patient's clinical condition at discharge and address if the herbal or natural product(s) should be resumed at that time.  Shelba Flake Achilles Dunk, PharmD Clinical Pharmacist - Resident Pager: [phone] Pharmacy: [phone] 12/06/2012 7:19 PM

## 2012-12-06 NOTE — Anesthesia Procedure Notes (Signed)
Procedure Name: Intubation Date/Time: 12/06/2012 10:22 AM Performed by: Jefm Miles E Pre-anesthesia Checklist: Patient identified, Emergency Drugs available, Suction available, Patient being monitored and Timeout performed Patient Re-evaluated:Patient Re-evaluated prior to inductionOxygen Delivery Method: Circle system utilized Preoxygenation: Pre-oxygenation with 100% oxygen Intubation Type: IV induction Ventilation: Mask ventilation without difficulty Laryngoscope Size: Mac and 3 Grade View: Grade I Tube type: Oral Tube size: 7.5 mm Number of attempts: 1 Airway Equipment and Method: Stylet Placement Confirmation: ETT inserted through vocal cords under direct vision,  positive ETCO2 and breath sounds checked- equal and bilateral Secured at: 23 cm Tube secured with: Tape Dental Injury: Teeth and Oropharynx as per pre-operative assessment

## 2012-12-06 NOTE — Progress Notes (Signed)
Patient ID: Ronnie Gregory, male   DOB: 10-Jun-1933, 79 y.o.   MRN: [id number] Awake resting comfortably moves both lower extremities well neuro stable.

## 2012-12-06 NOTE — Anesthesia Postprocedure Evaluation (Signed)
Anesthesia Post Note  Patient: Ronnie Gregory  Procedure(s) Performed: Procedure(s) (LRB):  POSTERIOR LUMBAR INTERBODY  FUSION 1 LEVEL LUMBAR TWO -THREE (N/A)  Anesthesia type: General  Patient location: PACU  Post pain: Pain level controlled and Adequate analgesia  Post assessment: Post-op Vital signs reviewed, Patient's Cardiovascular Status Stable, Respiratory Function Stable, Patent Airway and Pain level controlled  Last Vitals:  Filed Vitals:   12/06/12 1337  BP: 149/77  Pulse: 78  Temp: 36.1 C  Resp: 19    Post vital signs: Reviewed and stable  Level of consciousness: awake, alert  and oriented  Complications: No apparent anesthesia complications

## 2012-12-06 NOTE — Preoperative (Signed)
Beta Blockers   Reason not to administer Beta Blockers:Not Applicable, last dose 12/06/12

## 2012-12-06 NOTE — Transfer of Care (Signed)
Immediate Anesthesia Transfer of Care Note  Patient: Ronnie Gregory  Procedure(s) Performed: Procedure(s):  POSTERIOR LUMBAR INTERBODY  FUSION 1 LEVEL LUMBAR TWO -THREE (N/A)  Patient Location: PACU  Anesthesia Type:General  Level of Consciousness: lethargic and responds to stimulation  Airway & Oxygen Therapy: Patient Spontanous Breathing and Patient connected to nasal cannula oxygen  Post-op Assessment: Report given to PACU RN  Post vital signs: Reviewed and stable  Complications: No apparent anesthesia complications

## 2012-12-06 NOTE — Anesthesia Preprocedure Evaluation (Addendum)
Anesthesia Evaluation  Patient identified by MRN, date of birth, ID band Patient awake    Reviewed: Allergy & Precautions, H&P , NPO status , Patient's Chart, lab work & pertinent test results  Airway Mallampati: II TM Distance: >3 FB Neck ROM: Full    Dental  (+) Chipped, Missing and Dental Advisory Given,    Pulmonary neg pulmonary ROS,          Cardiovascular hypertension, + CAD, + Past MI and + Cardiac Stents Rhythm:Regular Rate:Normal     Neuro/Psych    GI/Hepatic   Endo/Other  Hypothyroidism   Renal/GU      Musculoskeletal   Abdominal   Peds  Hematology   Anesthesia Other Findings   Reproductive/Obstetrics                          Anesthesia Physical Anesthesia Plan  ASA: III  Anesthesia Plan: General   Post-op Pain Management:    Induction: Intravenous  Airway Management Planned: Oral ETT  Additional Equipment:   Intra-op Plan:   Post-operative Plan: Extubation in OR  Informed Consent: I have reviewed the patients History and Physical, chart, labs and discussed the procedure including the risks, benefits and alternatives for the proposed anesthesia with the patient or authorized representative who has indicated his/her understanding and acceptance.     Plan Discussed with: CRNA, Anesthesiologist and Surgeon  Anesthesia Plan Comments:         Anesthesia Quick Evaluation

## 2012-12-06 NOTE — Op Note (Signed)
Date of surgery: December 06 2012 Preoperative diagnosis: L. 2- 3 spondylosis and stenosis with neurogenic claudication and radiculopathy status post arthrodesis L3-L5 Postoperative diagnosis: L2-3 spondylosis and stenosis with neurogenic claudication and radiculopathy, status post arthrodesis L3-L5 Procedure: Laminectomy L2 decompression of L2 and L3 nerve roots with for workmen require for simple interbody arthrodesis, posterior lumbar interbody arthrodesis with peek spacers local autograft and allograft L2-L3. Pedicle screw fixation L2-L3 with posterior lateral arthrodesis L2-L3 Surgeon: Barnett Abu Assistant: Sharyon Cable Anesthesia: General endotracheal Indications: Ronnie Gregory is a 79 year old individual who had undergone surgical decompression and arthrodesis in 2 separate previous operations at L3-4 L4-5. 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 Been advised regarding the need for surgery.  Procedure: The patient was brought to the operating room supine on a stretcher. After the smooth induction of general endotracheal anesthesia he was turned prone. The back was prepped with alcohol and DuraPrep, and draped sterilely. A midline incision was made to his previous incision and this was dissected down to the lumbar dorsal fascia. The dissection was carried through the lumbar dorsal fascia to expose the old hardware. Once this was isolated the screw caps could be identified and they were dissected free. The screw caps were then removed. The rod was removed. The screws were then individually removed. With this being landmark for L3-L4 the dissection was then carried cephalad to expose the laminar arch and spinous process of L2 and the transverse process of L2 in intertransverse space between L2 and L3. These areas were decorticated and packed away for later grafting. Then a laminectomy was created removing the inferior marginal lamina of L2 out  to and including the entirety of the facet of L2-L3. This bone was laid aside and used for bone graft. Laminectomy was enlarged to remove thickened and redundant yellow ligament. The common dural tube and the take off of the L3 nerve roots were then decompressed and found to be severely stenotic at the entrance to the foramen underneath the L3 lamina. A partial laminotomy was created here. With the L3 nerve root being decompressed the common dural tube could be mobilized medially. Care was taken then to isolate and decompress the L2 roots in the foramen. These were each severely stenotic secondary to her redundant thickened ligament.  The disc space was then opened to evacuate a significant quantity of severely degenerated and desiccated disc material. The disc was completely evacuated using a combination of curettes and rongeurs to remove the entirety of the endplates both medially and laterally. Superior and inferior aspects of the endplates were also removed. The disc space was evacuated first on the left side than on the right side. An interbody spacer sizer was used initially size the interspace to 10 mm. A 10 mm box cutter was then used to form the edges of the endplate. This allowed placement of a 12 mm interbody spacer. Once the endplates were completely decorticated with a toothed curette, the interspace was filled with the autologous bone graft that had been earlier harvested and then to 12 mm peek spacers were placed into the interspace along with additional bone graft in the cost bone sponge.  Attention was then turned to placing pedicle screws in L2 this was done with fluoroscopic guidance first by drilling small pilot hole in the junction between the transverse process and the pars and then the guiding a probe with fluoroscopic guidance into the center of the pedicle and vertebral body.  6.5 x 40 mm screws were placed after tapping each one individually. The L3 screws were used on the left side with  a 6.5 x 40 mm screw being placed here on the right side the whole was reattached more to centralized in the pedicle itself and a 6.5 x 40 mm screw was placed here. Then the lateral gutters which had been previously packed away were packed with the remainder of the autologous bone graft and the cost bone sponge. Next 40 mm rods were placed between the screw heads at L2-L3, these were then torqued into final resting position. Final radiographs were obtained in the AP and lateral projections to check the integrity of the surgical construct. When this was verified and the wound was checked for hemostasis and then closed with #1 Vicryl in the lumbar dorsal fascia. 20 cc of half percent Marcaine was injected into the paraspinous fascia and muscle. 2-0 Vicryl in the subcutaneous tissues and 3-0 Vicryl was used to close the subcuticular skin. Dermabond was placed on the dressing and a dry sterile dressing was placed over this. Blood loss for the procedure was estimated at 450 cc and 150 cc of blood was returned to the patient.

## 2012-12-07 LAB — CBC
MCHC: 34.1 g/dL (ref 30.0–36.0)
MCV: 96.4 fL (ref 78.0–100.0)
Platelets: 211 10*3/uL (ref 150–400)
RBC: 3.86 MIL/uL — ABNORMAL LOW (ref 4.22–5.81)
RDW: 12.6 % (ref 11.5–15.5)
WBC: 10.2 10*3/uL (ref 4.0–10.5)

## 2012-12-07 LAB — BASIC METABOLIC PANEL
Chloride: 105 mEq/L (ref 96–112)
Creatinine, Ser: 1.21 mg/dL (ref 0.50–1.35)
GFR calc Af Amer: 65 mL/min — ABNORMAL LOW (ref >90)
Potassium: 4.4 mEq/L (ref 3.5–5.1)

## 2012-12-07 NOTE — Evaluation (Signed)
Occupational Therapy Evaluation Patient Details Name: Ronnie Gregory MRN: [id number] DOB: [birth year] Today's Date: 12/07/2012 Time: 5784-6962 OT Time Calculation (min): 23 min  OT Assessment / Plan / Recommendation History of present illness 79 y.o. s/p POSTERIOR LUMBAR INTERBODY  FUSION 1 LEVEL LUMBAR TWO -THREE (N/A)   Clinical Impression   Pt presents with below problem list. Pt independent with ADLs, PTA. Pt will benefit from acute OT to increase independence prior to d/c to daughter's house.     OT Assessment  Patient needs continued OT Services    Follow Up Recommendations  No OT follow up;Supervision/Assistance - 24 hour    Barriers to Discharge      Equipment Recommendations  None recommended by OT    Recommendations for Other Services    Frequency  Min 2X/week    Precautions / Restrictions Precautions Precautions: Back;Fall Precaution Booklet Issued: No Precaution Comments: Reviewed precautions with pt Required Braces or Orthoses: Spinal Brace Spinal Brace: Lumbar corset;Applied in sitting position   Pertinent Vitals/Pain Pain 2/10. Nurse in room and aware.     ADL  Upper Body Dressing: Set up;Supervision/safety Where Assessed - Upper Body Dressing: Unsupported sitting Lower Body Dressing: Minimal assistance Where Assessed - Lower Body Dressing: Supported sit to stand Toilet Transfer: Min Pension scheme manager Method: Sit to Barista: Comfort height toilet;Grab bars Tub/Shower Transfer Method: Not assessed Equipment Used: Back brace;Gait belt;Rolling walker;Reacher;Long-handled sponge;Long-handled shoe horn;Sock aid Transfers/Ambulation Related to ADLs: Min guard for transfers. Hand held assist during ambulation ADL Comments: Pt practiced with reacher and sockaid donning/doffing sock and underwear. Educated on use of two cups for teeth care and to set grooming items on left side of sink to avoid breaking precautions. Educated to  stand in front of chair/bed with walker in front when pulling up LB clothing.     OT Diagnosis: Acute pain  OT Problem List: Decreased range of motion;Impaired balance (sitting and/or standing);Decreased activity tolerance;Decreased knowledge of use of DME or AE;Decreased knowledge of precautions;Pain;Decreased strength OT Treatment Interventions: Self-care/ADL training;DME and/or AE instruction;Therapeutic activities;Patient/family education;Balance training   OT Goals(Current goals can be found in the care plan section) Acute Rehab OT Goals Patient Stated Goal: go home OT Goal Formulation: With patient Time For Goal Achievement: 12/14/12 Potential to Achieve Goals: Good ADL Goals Pt Will Perform Lower Body Bathing: with modified independence;with adaptive equipment;sit to/from stand Pt Will Perform Lower Body Dressing: with modified independence;with adaptive equipment;sit to/from stand Pt Will Transfer to Toilet: with modified independence;ambulating (3 in 1 over commode) Pt Will Perform Toileting - Clothing Manipulation and hygiene: with modified independence;sit to/from stand Pt Will Perform Tub/Shower Transfer: Shower transfer;with supervision;ambulating;shower seat;rolling walker Additional ADL Goal #1: Pt will perform bed mobility at Mod I level as precursor for ADLs.  Additional ADL Goal #2: Pt will independently verbalize 3/3 back precautions.   Visit Information  Last OT Received On: 12/07/12 Assistance Needed: +1 History of Present Illness: 79 y.o. s/p POSTERIOR LUMBAR INTERBODY  FUSION 1 LEVEL LUMBAR TWO -THREE (N/A)       Prior Functioning     Home Living Family/patient expects to be discharged to:: Private residence Living Arrangements: Children (going to stay with daughter) Available Help at Discharge: Family;Available 24 hours/day Type of Home: House Home Access: Stairs to enter [employer] of Steps: 3 Entrance Stairs-Rails: None Home Layout:  Multi-level;Able to live on main level with bedroom/bathroom (daughter's house is 3 levels) Home Equipment: Walker - 2 wheels;Cane - quad;Cane - single point;Crutches;Bedside  commode;Shower seat;Toilet riser;Adaptive equipment (pt has grab bars in tub and by toilet at his house) Adaptive Equipment: Reacher Additional Comments: Above information is for daughter's house Prior Function Level of Independence: Independent Communication Communication: No difficulties Dominant Hand: Left         Vision/Perception     Cognition  Cognition Arousal/Alertness: Awake/alert Behavior During Therapy: WFL for tasks assessed/performed Overall Cognitive Status: Within Functional Limits for tasks assessed    Extremity/Trunk Assessment Upper Extremity Assessment Upper Extremity Assessment: Overall WFL for tasks assessed Lower Extremity Assessment Lower Extremity Assessment: Defer to PT evaluation     Mobility Bed Mobility Bed Mobility: Left Sidelying to Sit Left Sidelying to Sit: 3: Mod assist Details for Bed Mobility Assistance: Assisted with trunk. Cues for technique. Transfers Transfers: Sit to Stand;Stand to Sit Sit to Stand: 4: Min guard;With upper extremity assist;From bed;From toilet;From chair/3-in-1 Stand to Sit: 4: Min guard;With upper extremity assist;To chair/3-in-1;To toilet Details for Transfer Assistance: Cues for hand placement and technique.     Exercise     Balance     End of Session OT - End of Session Equipment Utilized During Treatment: Gait belt;Rolling walker;Back brace Activity Tolerance: Patient tolerated treatment well Patient left: in chair;with call bell/phone within reach  GO     Earlie Raveling OTR/L [phone] 12/07/2012, 3:14 PM

## 2012-12-07 NOTE — Progress Notes (Signed)
Orthopedic Tech Progress Note Patient Details:  Ronnie Gregory 1933-03-23 [id number]  Patient ID: Ronnie Gregory, male   DOB: 1933/03/06, 79 y.o.   MRN: [id number]   Ronnie Gregory 12/07/2012, 9:57 AMCalled bio-[employer] for aspen lumbar fusion brace.

## 2012-12-07 NOTE — Evaluation (Signed)
Physical Therapy Evaluation Patient Details Name: Ronnie Gregory MRN: [id number] DOB: 02/15/1933 Today's Date: 12/07/2012 Time: 4098-1191 PT Time Calculation (min): 24 min  PT Assessment / Plan / Recommendation History of Present Illness  79 y.o. s/p POSTERIOR LUMBAR INTERBODY  FUSION 1 LEVEL LUMBAR TWO -THREE (N/A)  Clinical Impression  Patient demonstrates deficits in functional mobility as indicated below. Pt will benefit from continued skilled PT to address deficits and maximize independence. Will continue to see as indicated. rec HHPT upon discharge with family supervision and PRN assist.      PT Assessment  Patient needs continued PT services    Follow Up Recommendations  Home health PT;Supervision/Assistance - 24 hour    Does the patient have the potential to tolerate intense rehabilitation      Barriers to Discharge        Equipment Recommendations  None recommended by PT    Recommendations for Other Services     Frequency Min 5X/week    Precautions / Restrictions Precautions Precautions: Back;Fall Precaution Booklet Issued: Yes (comment) Precaution Comments: Educated with teach back Required Braces or Orthoses: Spinal Brace Spinal Brace: Lumbar corset;Applied in sitting position   Pertinent Vitals/Pain 3/10      Mobility  Bed Mobility Bed Mobility: Not assessed Left Sidelying to Sit: 3: Mod assist Details for Bed Mobility Assistance: Assisted with trunk. Cues for technique. Transfers Transfers: Sit to Stand;Stand to Sit Sit to Stand: 5: Supervision Stand to Sit: 5: Supervision Details for Transfer Assistance: Cues for hand placement and technique. Ambulation/Gait Ambulation/Gait Assistance: 5: Supervision Ambulation Distance (Feet): 420 Feet Assistive device: None Ambulation/Gait Assistance Details: steady with ambulation Gait Pattern: Within Functional Limits Gait velocity: decreased General Gait Details: steady but slow with  ambulation Stairs: No        PT Diagnosis: Difficulty walking;Acute pain  PT Problem List: Decreased strength;Decreased activity tolerance;Pain PT Treatment Interventions: DME instruction;Gait training;Stair training;Functional mobility training;Therapeutic activities;Therapeutic exercise;Balance training;Patient/family education     PT Goals(Current goals can be found in the care plan section) Acute Rehab PT Goals Patient Stated Goal: go home to daughters PT Goal Formulation: With patient Time For Goal Achievement: 12/21/12 Potential to Achieve Goals: Good  Visit Information  Last PT Received On: 12/07/12 Assistance Needed: +1 History of Present Illness: 79 y.o. s/p POSTERIOR LUMBAR INTERBODY  FUSION 1 LEVEL LUMBAR TWO -THREE (N/A)       Prior Functioning  Home Living Family/patient expects to be discharged to:: Private residence Living Arrangements: Children (going to stay with daughter) Available Help at Discharge: Family;Available 24 hours/day Type of Home: House Home Access: Stairs to enter [employer] of Steps: 3 Entrance Stairs-Rails: None Home Layout: Multi-level;Able to live on main level with bedroom/bathroom (daughter's house is 3 levels) Home Equipment: Walker - 2 wheels;Cane - quad;Cane - single point;Crutches;Bedside commode;Shower seat;Toilet riser;Adaptive equipment (pt has grab bars in tub and by toilet at his house) Adaptive Equipment: Reacher Additional Comments: Above information is for daughter's house Prior Function Level of Independence: Independent Communication Communication: No difficulties Dominant Hand: Left    Cognition  Cognition Arousal/Alertness: Awake/alert Behavior During Therapy: WFL for tasks assessed/performed Overall Cognitive Status: Within Functional Limits for tasks assessed    Extremity/Trunk Assessment Upper Extremity Assessment Upper Extremity Assessment: Overall WFL for tasks assessed Lower Extremity  Assessment Lower Extremity Assessment: Overall WFL for tasks assessed   Balance Balance Balance Assessed: Yes High Level Balance High Level Balance Activites: Side stepping;Backward walking;Direction changes;Turns;Head turns High Level Balance Comments: no physical assist  for balance  End of Session PT - End of Session Equipment Utilized During Treatment: Gait belt;Back brace Activity Tolerance: Patient tolerated treatment well Patient left: in chair;with call bell/phone within reach Nurse Communication: Mobility status  GP     Fabio Asa 12/07/2012, 4:02 PM Charlotte Crumb, PT DPT  [phone]

## 2012-12-07 NOTE — Progress Notes (Signed)
UR complete.  Nickolas Chalfin RN, MSN 

## 2012-12-07 NOTE — Progress Notes (Signed)
Pt does not have lumbar fusion corset brace yet, so cannot take foley catheter out without getting pt out of bed with brace. Will pass onto oncoming shift. [place], Kasy Iannacone N, RN

## 2012-12-07 NOTE — Progress Notes (Signed)
Subjective: Patient reports Feels fairly well.  Objective: Vital signs in last 24 hours: Temp:  [97.6 F (36.4 C)-98.2 F (36.8 C)] 97.9 F (36.6 C) (11/19 1750) Pulse Rate:  [72-85] 82 (11/19 1750) Resp:  [20] 20 (11/19 1750) BP: (94-126)/(46-68) 126/64 mmHg (11/19 1750) SpO2:  [92 %-98 %] 98 % (11/19 1750)  Intake/Output from previous day: 11/18 0701 - 11/19 0700 In: 2263 [I.V.:1853; Blood:160; IV Piggyback:250] Out: 1280 [Urine:830; Blood:450] Intake/Output this shift:    incision is cleanand dry . Motor function is intact.  Lab Results:  Recent Labs  12/07/12 0424  WBC 10.2  HGB 12.7*  HCT 37.2*  PLT 211   BMET  Recent Labs  12/07/12 0424  NA 136  K 4.4  CL 105  CO2 23  GLUCOSE 163*  BUN 21  CREATININE 1.21  CALCIUM 8.3*    Studies/Results: Dg Lumbar Spine 2-3 Views  12/06/2012   CLINICAL DATA:  L2-3 PLIF.  EXAM: LUMBAR SPINE - 2-3 VIEW  COMPARISON:  Lumbar MRI 11/15/2012.  Radiographs 12/27/2008.  FINDINGS: PA and lateral spot fluoroscopic images of the mid lumbar spine are limited by small field-of-view. There has been apparent interval removal of the L4 pedicle screws and placement of bilateral L2 pedicle screws. New interbody spacer at L2-3 appears well positioned. No complications are identified.  IMPRESSION: Intraoperative views following L2-3 PLIF. No demonstrated complication.   Electronically Signed   By: Roxy Horseman M.D.   On: 12/06/2012 17:03    Assessment/Plan: Stable post op.   LOS: 1 day  Mobilize, back brace to appear.   Vicky Mccanless J 12/07/2012, 8:32 PM

## 2012-12-08 MED ORDER — MORPHINE SULFATE 2 MG/ML IJ SOLN
1.0000 mg | INTRAMUSCULAR | Status: DC | PRN
  Administered 2012-12-08 – 2012-12-09 (×3): 2 mg via INTRAVENOUS
  Filled 2012-12-08: qty 2
  Filled 2012-12-08 (×3): qty 1

## 2012-12-08 MED ORDER — HYDROCODONE-ACETAMINOPHEN 5-325 MG PO TABS
1.0000 | ORAL_TABLET | ORAL | Status: DC | PRN
  Administered 2012-12-08 – 2012-12-09 (×2): 1 via ORAL
  Filled 2012-12-08 (×2): qty 2
  Filled 2012-12-08 (×2): qty 1

## 2012-12-08 MED ORDER — FLEET ENEMA 7-19 GM/118ML RE ENEM
1.0000 | ENEMA | Freq: Every day | RECTAL | Status: DC | PRN
  Administered 2012-12-08: 1 via RECTAL

## 2012-12-08 MED FILL — Sodium Chloride IV Soln 0.9%: INTRAVENOUS | Qty: 2000 | Status: AC

## 2012-12-08 MED FILL — Heparin Sodium (Porcine) Inj 1000 Unit/ML: INTRAMUSCULAR | Qty: 30 | Status: AC

## 2012-12-08 NOTE — Progress Notes (Signed)
Subjective: Patient reports Naseated last night. Used laxitive and enema to have BM. Now feels better  Objective: Vital signs in last 24 hours: Temp:  [97.8 F (36.6 C)-98.8 F (37.1 C)] 98.1 F (36.7 C) (11/20 0527) Pulse Rate:  [75-85] 76 (11/20 0527) Resp:  [18-20] 20 (11/20 0527) BP: (110-158)/(46-85) 158/62 mmHg (11/20 0527) SpO2:  [93 %-98 %] 94 % (11/20 0527)  Intake/Output from previous day: 11/19 0701 - 11/20 0700 In: 2185 [P.O.:940; I.V.:1245] Out: 500 [Urine:500] Intake/Output this shift:    Wound: incision clean and dry. Motor function is good.   Lab Results:  Recent Labs  12/07/12 0424  WBC 10.2  HGB 12.7*  HCT 37.2*  PLT 211   BMET  Recent Labs  12/07/12 0424  NA 136  K 4.4  CL 105  CO2 23  GLUCOSE 163*  BUN 21  CREATININE 1.21  CALCIUM 8.3*    Studies/Results: Dg Lumbar Spine 2-3 Views  12/06/2012   CLINICAL DATA:  L2-3 PLIF.  EXAM: LUMBAR SPINE - 2-3 VIEW  COMPARISON:  Lumbar MRI 11/15/2012.  Radiographs 12/27/2008.  FINDINGS: PA and lateral spot fluoroscopic images of the mid lumbar spine are limited by small field-of-view. There has been apparent interval removal of the L4 pedicle screws and placement of bilateral L2 pedicle screws. New interbody spacer at L2-3 appears well positioned. No complications are identified.  IMPRESSION: Intraoperative views following L2-3 PLIF. No demonstrated complication.   Electronically Signed   By: Roxy Horseman M.D.   On: 12/06/2012 17:03    Assessment/Plan: Ok to shower.    LOS: 2 days  change pain med to hydrocodone   Ronnie Gregory J 12/08/2012, 8:45 AM

## 2012-12-08 NOTE — Progress Notes (Signed)
Physical Therapy Treatment Patient Details Name: Ronnie Gregory MRN: [id number] DOB: 10/29/1933 Today's Date: 12/08/2012 Time: 1478-2956 PT Time Calculation (min): 24 min  PT Assessment / Plan / Recommendation  History of Present Illness 79 y.o. s/p POSTERIOR LUMBAR INTERBODY  FUSION 1 LEVEL LUMBAR TWO -THREE (N/A)   PT Comments   Patient with decreased ability today compared to evaluation. Patient limited with activity tolerance and demonstrates decreased safety and awareness. Pt also requires max cues and prompting for attending to task. ? If related to medication. Will see and progress as tolerated.   Follow Up Recommendations  Home health PT;Supervision/Assistance - 24 hour           Equipment Recommendations  None recommended by PT    Recommendations for Other Services    Frequency Min 5X/week   Progress towards PT Goals Progress towards PT goals: Progressing toward goals  Plan   Current plan remains appropriate   Precautions / Restrictions Precautions Precautions: Back;Fall Required Braces or Orthoses: Spinal Brace Spinal Brace: Lumbar corset;Applied in sitting position   Pertinent Vitals/Pain 2/10    Mobility  Bed Mobility Bed Mobility: Sit to Supine Left Sidelying to Sit: 4: Min assist Sit to Supine: 4: Min assist;HOB flat Details for Bed Mobility Assistance: Pt continues to needed (A) for BIL LE and sequence task  Transfers Transfers: Sit to Stand;Stand to Sit Sit to Stand: 4: Min guard;With upper extremity assist;From bed;From chair/3-in-1 Stand to Sit: 4: Min guard;With upper extremity assist;To bed Details for Transfer Assistance: cues for safe hand placement. Cue to remove brace prior to return supine Ambulation/Gait Ambulation/Gait Assistance: 5: Supervision;4: Min guard Ambulation Distance (Feet): 200 Feet Assistive device: Rolling walker Ambulation/Gait Assistance Details: steady but very slow and guarded compared to prior session, Gait velocity:  significantly decreased compared to prior session      PT Goals (current goals can now be found in the care plan section) Acute Rehab PT Goals Patient Stated Goal: go home to daughters PT Goal Formulation: With patient Time For Goal Achievement: 12/21/12 Potential to Achieve Goals: Good  Visit Information  Last PT Received On: 12/08/12 Assistance Needed: +1 History of Present Illness: 79 y.o. s/p POSTERIOR LUMBAR INTERBODY  FUSION 1 LEVEL LUMBAR TWO -THREE (N/A)    Subjective Data  Patient Stated Goal: go home to daughters   Cognition  Cognition Arousal/Alertness: Lethargic Behavior During Therapy: Flat affect Overall Cognitive Status: Impaired/Different from baseline Area of Impairment: Attention;Problem solving Current Attention Level: Sustained Memory: Decreased recall of precautions Problem Solving: Slow processing;Decreased initiation;Difficulty sequencing;Requires verbal cues;Requires tactile cues    Balance  High Level Balance High Level Balance Activites: Side stepping;Backward walking;Direction changes;Turns;Head turns High Level Balance Comments: min guard today  End of Session PT - End of Session Equipment Utilized During Treatment: Gait belt;Back brace Activity Tolerance: Patient tolerated treatment well Patient left: in chair;with call bell/phone within reach Nurse Communication: Mobility status   GP     Fabio Asa 12/08/2012, 11:33 AM

## 2012-12-08 NOTE — Progress Notes (Signed)
Occupational Therapy Treatment Patient Details Name: Ronnie Gregory MRN: [id number] DOB: 1933-03-17 Today's Date: 12/08/2012 Time: 4098-1191 OT Time Calculation (min): 13 min  OT Assessment / Plan / Recommendation  History of present illness 79 y.o. s/p POSTERIOR LUMBAR INTERBODY  FUSION 1 LEVEL LUMBAR TWO -THREE (N/A)   OT comments  Pt very lethargic and reports feeling nauseated this session. Pt needed (A) for bed mobility. Pt declined shower offered by therapist. Genia Harold to continue to follow.   Follow Up Recommendations  Home health OT    Barriers to Discharge       Equipment Recommendations  None recommended by OT    Recommendations for Other Services    Frequency Min 2X/week   Progress towards OT Goals Progress towards OT goals: Progressing toward goals  Plan Discharge plan needs to be updated    Precautions / Restrictions Precautions Precautions: Back;Fall Required Braces or Orthoses: Spinal Brace Spinal Brace: Lumbar corset;Applied in sitting position   Pertinent Vitals/Pain 2 out 10 back pain nausea    ADL  Upper Body Dressing: Min guard Where Assessed - Upper Body Dressing: Unsupported sitting (doff brace) Toilet Transfer: Min guard Toilet Transfer Method: Sit to Barista: Raised toilet seat with arms (or 3-in-1 over toilet) Equipment Used: Back brace;Gait belt;Rolling walker Transfers/Ambulation Related to ADLs: pt completed transfer from chair to bed level. Pt reports fatigue and nausea.  ADL Comments: Pt sitting in chair with 3 out 3 precaution recall. Pt very nauseate and declining to order lunch. Pt encouraged to order. Pt requesting to get back into the bed due to fatigue. pt reports poor nights rest and voiding his bowels . Pt needed extended time to scoot to the front the chair. pt pulling up on RW after cues for hand placement. Pt slow gait to eob. Pt needed (A) to complete sit<>supine with correct body alignment. pt resting supine  at end of session without further needs    OT Diagnosis:    OT Problem List:   OT Treatment Interventions:     OT Goals(current goals can now be found in the care plan section) Acute Rehab OT Goals Patient Stated Goal: go home to daughters OT Goal Formulation: With patient Time For Goal Achievement: 12/14/12 Potential to Achieve Goals: Good ADL Goals Pt Will Perform Lower Body Bathing: with modified independence;with adaptive equipment;sit to/from stand Pt Will Perform Lower Body Dressing: with modified independence;with adaptive equipment;sit to/from stand Pt Will Transfer to Toilet: with modified independence;ambulating Pt Will Perform Toileting - Clothing Manipulation and hygiene: with modified independence;sit to/from stand Pt Will Perform Tub/Shower Transfer: Shower transfer;with supervision;ambulating;shower seat;rolling walker Additional ADL Goal #1: Pt will perform bed mobility at Mod I level as precursor for ADLs.  Additional ADL Goal #2: Pt will independently verbalize 3/3 back precautions.   Visit Information  Last OT Received On: 12/08/12 Assistance Needed: +1 History of Present Illness: 79 y.o. s/p POSTERIOR LUMBAR INTERBODY  FUSION 1 LEVEL LUMBAR TWO -THREE (N/A)    Subjective Data      Prior Functioning       Cognition  Cognition Arousal/Alertness: Lethargic Behavior During Therapy: Flat affect Overall Cognitive Status: Within Functional Limits for tasks assessed    Mobility  Bed Mobility Bed Mobility: Sit to Supine Sit to Supine: 4: Min assist;HOB flat Details for Bed Mobility Assistance: Pt needed (A) for BIL LE and sequence task  Transfers Transfers: Sit to Stand;Stand to Sit Sit to Stand: 4: Min guard;With upper extremity assist;From chair/3-in-1 Stand  to Sit: 4: Min guard;With upper extremity assist;To bed Details for Transfer Assistance: cues for safe hand placement. Cue to remove brace prior to return supine    Exercises      Balance     End  of Session OT - End of Session Activity Tolerance: Patient limited by lethargy Patient left: in bed;with call bell/phone within reach Nurse Communication: Mobility status;Precautions  GO     Harolyn Rutherford 12/08/2012, 9:23 AM Pager: [phone]

## 2012-12-09 MED ORDER — METHOCARBAMOL 500 MG PO TABS: 500.0000 mg | ORAL_TABLET | Freq: Four times a day (QID) | ORAL | Status: DC | PRN

## 2012-12-09 MED ORDER — HYDROCODONE-ACETAMINOPHEN 5-325 MG PO TABS: 1.0000 | ORAL_TABLET | ORAL | Status: DC | PRN

## 2012-12-09 NOTE — Progress Notes (Signed)
Occupational Therapy Treatment Patient Details Name: Ronnie Gregory MRN: [id number] DOB: 12/03/1933 Today's Date: 12/09/2012 Time: 4098-1191 OT Time Calculation (min): 29 min  OT Assessment / Plan / Recommendation  History of present illness 79 y.o. s/p POSTERIOR LUMBAR INTERBODY  FUSION 1 LEVEL LUMBAR TWO -THREE (N/A)   OT comments  Pt completed shower transfer and used hand held shower. Pt reports feeling better after bathing. Pt needs constant cues for hand placement with RW and back precautions.   Follow Up Recommendations  Home health OT    Barriers to Discharge       Equipment Recommendations  None recommended by OT    Recommendations for Other Services    Frequency Min 2X/week   Progress towards OT Goals Progress towards OT goals: Progressing toward goals  Plan Discharge plan remains appropriate    Precautions / Restrictions Precautions Precautions: Back;Fall Required Braces or Orthoses: Spinal Brace Spinal Brace: Lumbar corset;Applied in sitting position Restrictions Weight Bearing Restrictions: No   Pertinent Vitals/Pain Premedicated Reports a "horrible night"   ADL  Grooming: Wash/dry hands;Wash/dry face;Supervision/safety Where Assessed - Grooming: Supported sitting Upper Body Bathing: Chest;Right arm;Left arm;Abdomen;Min guard Where Assessed - Upper Body Bathing: Supported sitting Lower Body Bathing: Min guard Where Assessed - Lower Body Bathing: Unsupported sit to stand (holding rail with Rt UE) Upper Body Dressing: Supervision/safety Where Assessed - Upper Body Dressing: Supported sitting Lower Body Dressing: Minimal assistance Where Assessed - Lower Body Dressing: Unsupported sit to stand Toilet Transfer: Min Pension scheme manager Method: Sit to Barista: Regular height toilet;Grab bars Toileting - Architect and Hygiene: Min guard Where Assessed - Engineer, mining and Hygiene: Sit to stand from  3-in-1 or toilet Equipment Used: Back brace;Rolling walker Transfers/Ambulation Related to ADLs: pt ambulating with RW min guard (A) ADL Comments: Pt educated on bed mobility adn completed a seated shower on 3n1. Pt needed (A) with LB dressing.Pt should complete bathing at home sitting. Pt reports "can't wait to get home and soak in that hot tub" Pt educated that he can not get into a hot tub or soak upon d/c. MD and RN please reinforce risk of submersion. Pt reports feeling betters s/p bathing    OT Diagnosis:    OT Problem List:   OT Treatment Interventions:     OT Goals(current goals can now be found in the care plan section) Acute Rehab OT Goals Patient Stated Goal: go home to daughters OT Goal Formulation: With patient Time For Goal Achievement: 12/14/12 Potential to Achieve Goals: Good ADL Goals Pt Will Perform Lower Body Bathing: with modified independence;with adaptive equipment;sit to/from stand Pt Will Perform Lower Body Dressing: with modified independence;with adaptive equipment;sit to/from stand Pt Will Transfer to Toilet: with modified independence;ambulating Pt Will Perform Toileting - Clothing Manipulation and hygiene: with modified independence;sit to/from stand Pt Will Perform Tub/Shower Transfer: Shower transfer;with supervision;ambulating;shower seat;rolling walker Additional ADL Goal #1: Pt will perform bed mobility at Mod I level as precursor for ADLs.  Additional ADL Goal #2: Pt will independently verbalize 3/3 back precautions.   Visit Information  Last OT Received On: 12/09/12 Assistance Needed: +1 History of Present Illness: 79 y.o. s/p POSTERIOR LUMBAR INTERBODY  FUSION 1 LEVEL LUMBAR TWO -THREE (N/A)    Subjective Data      Prior Functioning       Cognition  Cognition Arousal/Alertness: Awake/alert Behavior During Therapy: WFL for tasks assessed/performed Overall Cognitive Status: Within Functional Limits for tasks assessed Memory: Decreased recall  of precautions    Mobility  Bed Mobility Bed Mobility: Supine to Sit;Sitting - Scoot to Edge of Bed Left Sidelying to Sit: HOB flat;4: Min assist Supine to Sit: HOB flat;4: Min guard Sitting - Scoot to [place] of Bed: 4: Min assist Details for Bed Mobility Assistance: pt needed cues for safety and correct sequence Transfers Transfers: Sit to Stand;Stand to Sit Sit to Stand: 4: Min guard;With upper extremity assist;From bed (cues for hands) Stand to Sit: 4: Min guard;With upper extremity assist;To chair/3-in-1;To toilet Details for Transfer Assistance: pt needs constant cues for hand placment. Pt attempting to pull up on RW. Pt given cues for safe hand placement. Pt continue so OT allowed patient to attempt unsuccessful sit<>Stand with posterior LOB. Pt after x2 attempts following recommendation for hand placement    Exercises      Balance     End of Session OT - End of Session Activity Tolerance: Patient tolerated treatment well Patient left: Other (comment) (with PT [facility]) Nurse Communication: Mobility status;Precautions  GO     Boone Master B 12/09/2012, 11:03 AM Pager: [phone]

## 2012-12-09 NOTE — Progress Notes (Signed)
Physical Therapy Treatment Patient Details Name: Ronnie Gregory MRN: [id number] DOB: 1933-10-13 Today's Date: 12/09/2012 Time: 8119-1478 PT Time Calculation (min): 18 min  PT Assessment / Plan / Recommendation  History of Present Illness 79 y.o. s/p POSTERIOR LUMBAR INTERBODY  FUSION 1 LEVEL LUMBAR TWO -THREE (N/A)   PT Comments   Patient demonstrates some improvements compared to prior session. Able to ambulate with RW, perform stair negotiation, and demonstrate log roll technique for bed mobility. Will continue to see as indicated and progress activity as tolerated for discharge.   Follow Up Recommendations  Home health PT;Supervision/Assistance - 24 hour           Equipment Recommendations  None recommended by PT       Frequency Min 5X/week   Progress towards PT Goals Progress towards PT goals: Progressing toward goals  Plan Current plan remains appropriate    Precautions / Restrictions Precautions Precautions: Back;Fall Required Braces or Orthoses: Spinal Brace Spinal Brace: Lumbar corset;Applied in sitting position Restrictions Weight Bearing Restrictions: No   Pertinent Vitals/Pain 2/10    Mobility  Bed Mobility Bed Mobility: Rolling Right;Sit to Sidelying Left Rolling Right: 5: Supervision Left Sidelying to Sit: HOB flat;4: Min assist Supine to Sit: HOB flat;4: Min guard Sitting - Scoot to Edge of Bed: 4: Min assist Sit to Supine: 4: Min assist Details for Bed Mobility Assistance: Cues for technique and assist for positioning Transfers Transfers: Sit to Stand;Stand to Sit Sit to Stand: 4: Min guard;With upper extremity assist;From bed Stand to Sit: 4: Min guard;With upper extremity assist;To chair/3-in-1;To toilet Details for Transfer Assistance: VCs for hand placement with use of RW Ambulation/Gait Ambulation/Gait Assistance: 5: Supervision;4: Min guard Ambulation Distance (Feet): 220 Feet Assistive device: Rolling walker Ambulation/Gait Assistance  Details: max VCs for increased cadence Gait velocity: significantly decreased compared to prior session Stairs: Yes Stairs Assistance: 5: Supervision Stair Management Technique: Two rails;Step to pattern;Forwards Number of Stairs: 3    Exercises     PT Diagnosis:    PT Problem List:   PT Treatment Interventions:     PT Goals (current goals can now be found in the care plan section) Acute Rehab PT Goals Patient Stated Goal: go home to daughters PT Goal Formulation: With patient Time For Goal Achievement: 12/21/12 Potential to Achieve Goals: Good  Visit Information  Last PT Received On: 12/09/12 Assistance Needed: +1 History of Present Illness: 79 y.o. s/p POSTERIOR LUMBAR INTERBODY  FUSION 1 LEVEL LUMBAR TWO -THREE (N/A)    Subjective Data  Subjective: i had a hard time iwth pain last night Patient Stated Goal: go home to daughters   Cognition  Cognition Arousal/Alertness: Awake/alert Behavior During Therapy: WFL for tasks assessed/performed Overall Cognitive Status: Within Functional Limits for tasks assessed Memory: Decreased recall of precautions    Balance  High Level Balance High Level Balance Activites: Side stepping;Backward walking;Direction changes;Turns;Head turns High Level Balance Comments: min guard today  End of Session PT - End of Session Equipment Utilized During Treatment: Gait belt;Back brace Activity Tolerance: Patient tolerated treatment well Patient left: in chair;with call bell/phone within reach Nurse Communication: Mobility status   GP     Fabio Asa 12/09/2012, 11:09 AM

## 2012-12-09 NOTE — Progress Notes (Signed)
UR complete.  Kinnick Maus RN, MSN 

## 2012-12-09 NOTE — Discharge Summary (Signed)
Physician Discharge Summary  Patient ID: Ronnie Gregory MRN: [id number] DOB/AGE: 04-25-1933 79 y.o.  Admit date: 12/06/2012 Discharge date: 12/09/2012  Admission Diagnoses: Lumbar spondylosis and stenosis L2-L3 status post arthrodesis L3-L5  Discharge Diagnoses: Jeanie Cooks spondylosis and stenosis L2 L3 status post arthrodesis L3-L5 acute blood loss Active Problems:   * No active hospital problems. *   Discharged Condition: good  Hospital Course: Patient was admitted to undergo surgical decompression arthrodesis at L2-L3 hardware from L3-L5 was removed. Patient tolerated surgery well.  Consults: None  Significant Diagnostic Studies: None  Treatments: surgery: Decompression of L2-L3 via laminectomy L2 posterior lumbar interbody arthrodesis using peek spacers local autograft and allograft segmental fixation L2-L3 with pedicle screws. A straight lateral arthrodesis L2-L3 with autograft and allograft.  Discharge Exam: Blood pressure 160/78, pulse 76, temperature 98.9 F (37.2 C), temperature source Oral, resp. rate 20, height 5\' 9"  (1.753 m), weight 91.264 kg (201 lb 3.2 oz), SpO2 97.00%. Incision is clean and dry motor function is intact in lower extremities appear  Disposition:  discharge home  Discharge Orders   Future Orders Complete By Expires   Call MD for:  redness, tenderness, or signs of infection (pain, swelling, redness, odor or green/yellow discharge around incision site)  As directed    Call MD for:  severe uncontrolled pain  As directed    Call MD for:  temperature >100.4  As directed    Diet - low sodium heart healthy  As directed    Discharge instructions  As directed    Comments:     Okay to shower. Do not apply salves or appointments to incision. No heavy lifting with the upper extremities greater than 15 pounds. May resume driving when not requiring pain medication and patient feels comfortable with doing so.   Increase activity slowly  As directed         Medication List         aspirin EC 81 MG tablet  Take 81 mg by mouth daily.     CASCARA SAGRADA PO  Take 1 tablet by mouth at bedtime.     Fish Oil 600 MG Caps  Take 1,800 mg by mouth daily.     HYDROcodone-acetaminophen 5-325 MG per tablet  Commonly known as:  NORCO/VICODIN  Take 1-2 tablets by mouth every 4 (four) hours as needed for moderate pain.     levothyroxine 100 MCG tablet  Commonly known as:  SYNTHROID, LEVOTHROID  Take 100 mcg by mouth daily before breakfast.     lisinopril 2.5 MG tablet  Commonly known as:  PRINIVIL,ZESTRIL  Take 2.5 mg by mouth daily.     methocarbamol 500 MG tablet  Commonly known as:  ROBAXIN  Take 1 tablet (500 mg total) by mouth every 6 (six) hours as needed for muscle spasms.     metoprolol tartrate 25 MG tablet  Commonly known as:  LOPRESSOR  Take 12.5 mg by mouth 2 (two) times daily.     pravastatin 40 MG tablet  Commonly known as:  PRAVACHOL  Take 40 mg by mouth at bedtime.         SignedStefani Dama 12/09/2012, 1:57 PM

## 2012-12-09 NOTE — Progress Notes (Signed)
CSW received referral for questionable SNF.   Chart reviewed, spoke with Pt who indicates plan is to d/c home. Will advise RN Case Manager for assessment of home health and DME needs.   CSW will sign off. Please re-consult is CSW needs arise.    Sedra Morfin LCSWA  [facility]  4N 1-16;  6N1-16 Phone: [phone]      

## 2013-06-13 ENCOUNTER — Encounter: Payer: Self-pay | Admitting: Cardiovascular Disease

## 2013-06-13 ENCOUNTER — Ambulatory Visit: Payer: Medicare Other | Admitting: Cardiovascular Disease

## 2013-08-16 ENCOUNTER — Ambulatory Visit (INDEPENDENT_AMBULATORY_CARE_PROVIDER_SITE_OTHER): Payer: Medicare Other | Admitting: Cardiovascular Disease

## 2013-08-16 VITALS — BP 152/80 | HR 62 | Ht 70.0 in | Wt 204.0 lb

## 2013-08-16 DIAGNOSIS — Z955 Presence of coronary angioplasty implant and graft: Secondary | ICD-10-CM

## 2013-08-16 DIAGNOSIS — Z79899 Other long term (current) drug therapy: Secondary | ICD-10-CM

## 2013-08-16 DIAGNOSIS — E785 Hyperlipidemia, unspecified: Secondary | ICD-10-CM

## 2013-08-16 DIAGNOSIS — I2510 Atherosclerotic heart disease of native coronary artery without angina pectoris: Secondary | ICD-10-CM

## 2013-08-16 DIAGNOSIS — Z9861 Coronary angioplasty status: Secondary | ICD-10-CM

## 2013-08-16 DIAGNOSIS — I10 Essential (primary) hypertension: Secondary | ICD-10-CM

## 2013-08-16 NOTE — Progress Notes (Signed)
Patient ID: Ronnie Gregory, male   DOB: Jul 19, 1933, 80 y.o.   MRN: [id number]      SUBJECTIVE: The patient is a 80 year old man with a history of coronary artery disease who reportedly had 2 stents placed roughly 3 years ago. He underwent a negative stress echocardiogram and walked for 6 minutes on the Bruce protocol achieving 7 METS on 08/02/2010.  Cardiac catheterization report from 04/29/2010 revealed a moderate sized LAD with proximal calcification and mild diffuse nonobstructive disease up to 30%. The left circumflex coronary artery was moderate in size with no obstructive stenosis. The RCA was a large dominant vessel with a long subtotal 90% stenosis in the mid vessel stenosis and a focal distal subtotal 90% stenosis. There appeared to be competitive flow from left-sided collaterals. Left ventricular systolic function was reportedly normal with inferior hypokinesis.  An echocardiogram performed on 04/28/2010 showed left into a systolic function to be normal to hyperdynamic with evidence of left ventricular hypertrophy and mild mitral and tricuspid regurgitation with no regional wall motion abnormalities.   He successfully underwent an L2-3 posterior lumbar interbody fusion on 12/06/2012 and was hospitalized for three days.  The patient denies any symptoms of chest pain, palpitations, shortness of breath, lightheadedness, dizziness, leg swelling, orthopnea, PND, and syncope.  He takes care of his wife with Alzheimer's which is very difficult for him, but has the help of 4 daughters.   Review of Systems: As per "subjective", otherwise negative.  No Known Allergies  Current Outpatient Prescriptions  Medication Sig Dispense Refill  . aspirin EC 81 MG tablet Take 81 mg by mouth daily.      . CASCARA SAGRADA PO Take 1 tablet by mouth at bedtime.      Marland Kitchen. HYDROcodone-acetaminophen (NORCO/VICODIN) 5-325 MG per tablet Take 1-2 tablets by mouth every 4 (four) hours as needed for moderate pain.   60 tablet  0  . levothyroxine (SYNTHROID, LEVOTHROID) 100 MCG tablet Take 100 mcg by mouth daily before breakfast.      . lisinopril (PRINIVIL,ZESTRIL) 2.5 MG tablet Take 2.5 mg by mouth daily.      . metoprolol tartrate (LOPRESSOR) 25 MG tablet Take 12.5 mg by mouth 2 (two) times daily.      . Omega-3 Fatty Acids (FISH OIL) 600 MG CAPS Take 1,800 mg by mouth daily.      . pravastatin (PRAVACHOL) 40 MG tablet Take 40 mg by mouth at bedtime.      . methocarbamol (ROBAXIN) 500 MG tablet Take 1 tablet (500 mg total) by mouth every 6 (six) hours as needed for muscle spasms.  60 tablet  3   No current facility-administered medications for this visit.    Past Medical History  Diagnosis Date  . Hypertension   . Myocardial infarction     Dr. Caryn [place] cardi[place]  . Hypothyroidism   . Hypercholesteremia     Past Surgical History  Procedure Laterality Date  . Cardiac catheterization    . Coronary angioplasty      3 years ago  . Back surgery      x 3    History   Social History  . Marital Status: Married    Spouse Name: N/A    Number of Children: N/A  . Years of Education: N/A   Occupational History  . Not on file.   Social History Main Topics  . Smoking status: Never Smoker   . Smokeless tobacco: Not on file  . Alcohol Use: No  . Drug  Use: No  . Sexual Activity: Not on file   Other Topics Concern  . Not on file   Social History Narrative  . No narrative on file     Filed Vitals:   08/16/13 1141  BP: 152/80  Pulse: 62  Height: 5\' 10"  (1.778 m)  Weight: 204 lb (92.534 kg)    PHYSICAL EXAM General: NAD Neck: No JVD, no thyromegaly. Lungs: Clear to auscultation bilaterally with normal respiratory effort. CV: Nondisplaced PMI.  Regular rate and rhythm, normal S1/S2, no S3/S4, no murmur. No pretibial or periankle edema.  No carotid bruit.  Normal pedal pulses.  Abdomen: Soft, nontender, no hepatosplenomegaly, no distention.  Neurologic: Alert and  oriented x 3.  Psych: Normal affect. Extremities: No clubbing or cyanosis.   ECG: reviewed and available in electronic records.      ASSESSMENT AND PLAN: 1. CAD: Symptomatically stable. Continue aspirin, metoprolol, and pravastatin. 2. Essential HTN: Uncontrolled today, but lisinopril was reportedly increased to 5 mg daily which he has not begun yet. 3. Hyperlipidemia: Will check lipids and LFTs. Continue pravastatin 40 mg daily.  Dispo: f/u 6 months.  Prentice Docker, M.D., F.A.C.C.

## 2013-08-16 NOTE — Patient Instructions (Signed)
   Labs for FLP, LFT - Reminder:  Nothing to eat or drink after 12 midnight prior to labs. Office will contact with results via phone or letter.   Continue all current medications. Your physician wants you to follow up in: 6 months.  You will receive a reminder letter in the mail one-two months in advance.  If you don't receive a letter, please call our office to schedule the follow up appointment

## 2013-09-26 ENCOUNTER — Encounter: Payer: Self-pay | Admitting: *Deleted

## 2014-07-16 ENCOUNTER — Other Ambulatory Visit: Payer: Self-pay

## 2014-09-21 ENCOUNTER — Other Ambulatory Visit: Payer: Self-pay | Admitting: Neurological Surgery

## 2014-10-11 ENCOUNTER — Encounter (HOSPITAL_COMMUNITY): Payer: Self-pay

## 2014-10-11 ENCOUNTER — Encounter (HOSPITAL_COMMUNITY)
Admission: RE | Admit: 2014-10-11 | Discharge: 2014-10-11 | Disposition: A | Discharge status: Home / Self Care | Payer: Medicare Other | Source: Ambulatory Visit | Attending: Neurological Surgery | Admitting: Neurological Surgery

## 2014-10-11 DIAGNOSIS — Z01818 Encounter for other preprocedural examination: Secondary | ICD-10-CM | POA: Insufficient documentation

## 2014-10-11 DIAGNOSIS — I10 Essential (primary) hypertension: Secondary | ICD-10-CM | POA: Diagnosis not present

## 2014-10-11 DIAGNOSIS — Z01812 Encounter for preprocedural laboratory examination: Secondary | ICD-10-CM | POA: Diagnosis not present

## 2014-10-11 DIAGNOSIS — M4806 Spinal stenosis, lumbar region: Secondary | ICD-10-CM | POA: Insufficient documentation

## 2014-10-11 DIAGNOSIS — Z0183 Encounter for blood typing: Secondary | ICD-10-CM | POA: Insufficient documentation

## 2014-10-11 LAB — CBC
HCT: 43.9 % (ref 39.0–52.0)
Hemoglobin: 14.6 g/dL (ref 13.0–17.0)
MCH: 32.6 pg (ref 26.0–34.0)
MCHC: 33.3 g/dL (ref 30.0–36.0)
MCV: 98 fL (ref 78.0–100.0)
PLATELETS: 278 10*3/uL (ref 150–400)
RBC: 4.48 MIL/uL (ref 4.22–5.81)
RDW: 12.4 % (ref 11.5–15.5)
WBC: 7.1 10*3/uL (ref 4.0–10.5)

## 2014-10-11 LAB — SURGICAL PCR SCREEN
MRSA, PCR: NEGATIVE
Staphylococcus aureus: NEGATIVE

## 2014-10-11 LAB — BASIC METABOLIC PANEL
ANION GAP: 10 (ref 5–15)
BUN: 19 mg/dL (ref 6–20)
CALCIUM: 9.9 mg/dL (ref 8.9–10.3)
CO2: 23 mmol/L (ref 22–32)
CREATININE: 1.07 mg/dL (ref 0.61–1.24)
Chloride: 106 mmol/L (ref 101–111)
GFR calc Af Amer: >60 mL/min (ref >60)
GLUCOSE: 107 mg/dL — AB (ref 65–99)
Potassium: 4.7 mmol/L (ref 3.5–5.1)
Sodium: 139 mmol/L (ref 135–145)

## 2014-10-11 LAB — TYPE AND SCREEN
ABO/RH(D): O POS
Antibody Screen: NEGATIVE

## 2014-10-11 NOTE — Pre-Procedure Instructions (Signed)
Ronnie Gregory  10/11/2014      [facility] - [place], [place] - [address] [place] [address] Phone: [phone] Fax: [phone]    Your procedure is scheduled on  Tuesday  10/16/14  Report to [facility] Admitting at 845 A.M.  Call this number if you have problems the morning of surgery:  [phone]   Remember:  Do not eat food or drink liquids after midnight.  Take these medicines the morning of surgery with A SIP OF WATER  LEVOTHYROXINE (SYNTHROID), METOPROLOL (LOPRESSOR)  (STOP ASPIRIN, COUMADIN, PLAVIX, EFFIENT, HERBAL MEDICINES, FISH OIL )  Do not wear jewelry, make-up or nail polish.  Do not wear lotions, powders, or perfumes.  You may wear deodorant.  Do not shave 48 hours prior to surgery.  Men may shave face and neck.  Do not bring valuables to the hospital.  [facility] is not responsible for any belongings or valuables.  Contacts, dentures or bridgework may not be worn into surgery.  Leave your suitcase in the car.  After surgery it may be brought to your room.  For patients admitted to the hospital, discharge time will be determined by your treatment team.  Patients discharged the day of surgery will not be allowed to drive home.   Name and phone number of your driver:   Special instructions:  [facility] - Preparing for Surgery  Before surgery, you can play an important role.  Because skin is not sterile, your skin needs to be as free of germs as possible.  You can reduce the number of germs on you skin by washing with CHG (chlorahexidine gluconate) soap before surgery.  CHG is an antiseptic cleaner which kills germs and bonds with the skin to continue killing germs even after washing.  Please DO NOT use if you have an allergy to CHG or antibacterial soaps.  If your skin becomes reddened/irritated stop using the CHG and inform your nurse when you arrive at Short Stay.  Do not shave (including legs and underarms) for at least 48  hours prior to the first CHG shower.  You may shave your face.  Please follow these instructions carefully:   1.  Shower with CHG Soap the night before surgery and the                                morning of Surgery.  2.  If you choose to wash your hair, wash your hair first as usual with your       normal shampoo.  3.  After you shampoo, rinse your hair and body thoroughly to remove the                      Shampoo.  4.  Use CHG as you would any other liquid soap.  You can apply chg directly       to the skin and wash gently with scrungie or a clean washcloth.  5.  Apply the CHG Soap to your body ONLY FROM THE NECK DOWN.        Do not use on open wounds or open sores.  Avoid contact with your eyes,       ears, mouth and genitals (private parts).  Wash genitals (private parts)       with your normal soap.  6.  Wash thoroughly, paying special attention  to the area where your surgery        will be performed.  7.  Thoroughly rinse your body with warm water from the neck down.  8.  DO NOT shower/wash with your normal soap after using and rinsing off       the CHG Soap.  9.  Pat yourself dry with a clean towel.            10.  Wear clean pajamas.            11.  Place clean sheets on your bed the night of your first shower and do not        sleep with pets.  Day of Surgery  Do not apply any lotions/deoderants the morning of surgery.  Please wear clean clothes to the hospital/surgery center.    Please read over the following fact sheets that you were given. Pain Booklet, Coughing and Deep Breathing, Blood Transfusion Information, MRSA Information and Surgical Site Infection Prevention

## 2014-10-15 MED ORDER — CEFAZOLIN SODIUM-DEXTROSE 2-3 GM-% IV SOLR
2.0000 g | INTRAVENOUS | Status: AC
  Administered 2014-10-16: 2 g via INTRAVENOUS
  Filled 2014-10-15: qty 50

## 2014-10-16 ENCOUNTER — Inpatient Hospital Stay (HOSPITAL_COMMUNITY): Payer: Medicare Other | Admitting: Critical Care Medicine

## 2014-10-16 ENCOUNTER — Inpatient Hospital Stay (HOSPITAL_COMMUNITY): Payer: Medicare Other

## 2014-10-16 ENCOUNTER — Encounter (HOSPITAL_COMMUNITY): Payer: Self-pay | Admitting: *Deleted

## 2014-10-16 ENCOUNTER — Inpatient Hospital Stay (HOSPITAL_COMMUNITY)
Admission: RE | Admit: 2014-10-16 | Discharge: 2014-10-17 | DRG: 460 | Disposition: A | Discharge status: Home / Self Care | Payer: Medicare Other | Source: Ambulatory Visit | Attending: Neurological Surgery | Admitting: Neurological Surgery

## 2014-10-16 ENCOUNTER — Encounter (HOSPITAL_COMMUNITY)
Admission: RE | Disposition: A | Discharge status: Home / Self Care | Payer: Medicare Other | Source: Ambulatory Visit | Attending: Neurological Surgery

## 2014-10-16 DIAGNOSIS — E039 Hypothyroidism, unspecified: Secondary | ICD-10-CM | POA: Diagnosis present

## 2014-10-16 DIAGNOSIS — E780 Pure hypercholesterolemia: Secondary | ICD-10-CM | POA: Diagnosis present

## 2014-10-16 DIAGNOSIS — I252 Old myocardial infarction: Secondary | ICD-10-CM | POA: Diagnosis not present

## 2014-10-16 DIAGNOSIS — I10 Essential (primary) hypertension: Secondary | ICD-10-CM | POA: Diagnosis present

## 2014-10-16 DIAGNOSIS — Z981 Arthrodesis status: Secondary | ICD-10-CM | POA: Diagnosis not present

## 2014-10-16 DIAGNOSIS — M5116 Intervertebral disc disorders with radiculopathy, lumbar region: Principal | ICD-10-CM | POA: Diagnosis present

## 2014-10-16 DIAGNOSIS — M549 Dorsalgia, unspecified: Secondary | ICD-10-CM | POA: Diagnosis present

## 2014-10-16 DIAGNOSIS — G834 Cauda equina syndrome: Secondary | ICD-10-CM | POA: Diagnosis present

## 2014-10-16 DIAGNOSIS — Z85828 Personal history of other malignant neoplasm of skin: Secondary | ICD-10-CM

## 2014-10-16 DIAGNOSIS — M48062 Spinal stenosis, lumbar region with neurogenic claudication: Secondary | ICD-10-CM | POA: Diagnosis present

## 2014-10-16 DIAGNOSIS — Z419 Encounter for procedure for purposes other than remedying health state, unspecified: Secondary | ICD-10-CM

## 2014-10-16 SURGERY — POSTERIOR LUMBAR FUSION 1 LEVEL
Anesthesia: General | Site: Back

## 2014-10-16 MED ORDER — 0.9 % SODIUM CHLORIDE (POUR BTL) OPTIME
TOPICAL | Status: DC | PRN
  Administered 2014-10-16: 1000 mL

## 2014-10-16 MED ORDER — LEVOTHYROXINE SODIUM 50 MCG PO TABS
75.0000 ug | ORAL_TABLET | Freq: Every day | ORAL | Status: DC
  Administered 2014-10-17: 75 ug via ORAL
  Filled 2014-10-16 (×2): qty 1

## 2014-10-16 MED ORDER — SODIUM CHLORIDE 0.9 % IV SOLN
INTRAVENOUS | Status: DC
  Administered 2014-10-16: 18:00:00 via INTRAVENOUS

## 2014-10-16 MED ORDER — MENTHOL 3 MG MT LOZG: 1.0000 | LOZENGE | OROMUCOSAL | Status: DC | PRN

## 2014-10-16 MED ORDER — HYDROMORPHONE HCL 1 MG/ML IJ SOLN
0.2500 mg | INTRAMUSCULAR | Status: DC | PRN
  Administered 2014-10-16: 0.5 mg via INTRAVENOUS

## 2014-10-16 MED ORDER — THROMBIN 20000 UNITS EX SOLR
CUTANEOUS | Status: DC | PRN
  Administered 2014-10-16: 13:00:00 via TOPICAL

## 2014-10-16 MED ORDER — ACETAMINOPHEN 650 MG RE SUPP: 650.0000 mg | RECTAL | Status: DC | PRN

## 2014-10-16 MED ORDER — CASCARA SAGRADA 1 GM/ML PO EXTR
Freq: Every day | ORAL | Status: DC
Start: 2014-10-16 — End: 2014-10-16

## 2014-10-16 MED ORDER — DEXAMETHASONE SODIUM PHOSPHATE 10 MG/ML IJ SOLN
INTRAMUSCULAR | Status: AC
  Filled 2014-10-16: qty 1

## 2014-10-16 MED ORDER — PHENOL 1.4 % MT LIQD: 1.0000 | OROMUCOSAL | Status: DC | PRN

## 2014-10-16 MED ORDER — FLEET ENEMA 7-19 GM/118ML RE ENEM: 1.0000 | ENEMA | Freq: Once | RECTAL | Status: DC | PRN

## 2014-10-16 MED ORDER — THROMBIN 5000 UNITS EX SOLR
OROMUCOSAL | Status: DC | PRN
  Administered 2014-10-16: 13:00:00 via TOPICAL

## 2014-10-16 MED ORDER — PHENYLEPHRINE HCL 10 MG/ML IJ SOLN
10.0000 mg | INTRAVENOUS | Status: DC | PRN
  Administered 2014-10-16: 20 ug/min via INTRAVENOUS

## 2014-10-16 MED ORDER — DOCUSATE SODIUM 100 MG PO CAPS
100.0000 mg | ORAL_CAPSULE | Freq: Two times a day (BID) | ORAL | Status: DC
  Administered 2014-10-16 – 2014-10-17 (×2): 100 mg via ORAL
  Filled 2014-10-16 (×2): qty 1

## 2014-10-16 MED ORDER — LIDOCAINE-EPINEPHRINE 1 %-1:100000 IJ SOLN
INTRAMUSCULAR | Status: DC | PRN
  Administered 2014-10-16: 5 mL

## 2014-10-16 MED ORDER — ALUM & MAG HYDROXIDE-SIMETH 200-200-20 MG/5ML PO SUSP: 30.0000 mL | Freq: Four times a day (QID) | ORAL | Status: DC | PRN

## 2014-10-16 MED ORDER — EPHEDRINE SULFATE 50 MG/ML IJ SOLN
INTRAMUSCULAR | Status: AC
  Filled 2014-10-16: qty 1

## 2014-10-16 MED ORDER — OXYCODONE-ACETAMINOPHEN 5-325 MG PO TABS
1.0000 | ORAL_TABLET | ORAL | Status: DC | PRN
  Administered 2014-10-16: 2 via ORAL
  Filled 2014-10-16: qty 2

## 2014-10-16 MED ORDER — ARTIFICIAL TEARS OP OINT
TOPICAL_OINTMENT | OPHTHALMIC | Status: DC | PRN
  Administered 2014-10-16: 1 via OPHTHALMIC

## 2014-10-16 MED ORDER — ONDANSETRON HCL 4 MG/2ML IJ SOLN
4.0000 mg | INTRAMUSCULAR | Status: DC | PRN
  Filled 2014-10-16: qty 2

## 2014-10-16 MED ORDER — SODIUM CHLORIDE 0.9 % IR SOLN
Status: DC | PRN
  Administered 2014-10-16: 13:00:00

## 2014-10-16 MED ORDER — LIDOCAINE HCL (CARDIAC) 20 MG/ML IV SOLN
INTRAVENOUS | Status: AC
  Filled 2014-10-16: qty 10

## 2014-10-16 MED ORDER — CEFAZOLIN SODIUM 1-5 GM-% IV SOLN
1.0000 g | Freq: Three times a day (TID) | INTRAVENOUS | Status: AC
  Administered 2014-10-16 – 2014-10-17 (×2): 1 g via INTRAVENOUS
  Filled 2014-10-16 (×2): qty 50

## 2014-10-16 MED ORDER — LISINOPRIL 5 MG PO TABS
5.0000 mg | ORAL_TABLET | Freq: Every day | ORAL | Status: DC
  Administered 2014-10-16 – 2014-10-17 (×2): 5 mg via ORAL
  Filled 2014-10-16 (×2): qty 1

## 2014-10-16 MED ORDER — BUPIVACAINE HCL (PF) 0.5 % IJ SOLN
INTRAMUSCULAR | Status: DC | PRN
  Administered 2014-10-16: 5 mL
  Administered 2014-10-16: 10 mL

## 2014-10-16 MED ORDER — ACETAMINOPHEN 325 MG PO TABS: 650.0000 mg | ORAL_TABLET | ORAL | Status: DC | PRN

## 2014-10-16 MED ORDER — FENTANYL CITRATE (PF) 250 MCG/5ML IJ SOLN
INTRAMUSCULAR | Status: AC
  Filled 2014-10-16: qty 5

## 2014-10-16 MED ORDER — PROPOFOL 10 MG/ML IV BOLUS
INTRAVENOUS | Status: AC
  Filled 2014-10-16: qty 20

## 2014-10-16 MED ORDER — ONDANSETRON HCL 4 MG/2ML IJ SOLN
INTRAMUSCULAR | Status: DC | PRN
  Administered 2014-10-16: 4 mg via INTRAVENOUS

## 2014-10-16 MED ORDER — PROPOFOL 10 MG/ML IV BOLUS
INTRAVENOUS | Status: DC | PRN
  Administered 2014-10-16: 120 mg via INTRAVENOUS

## 2014-10-16 MED ORDER — EPHEDRINE SULFATE 50 MG/ML IJ SOLN
INTRAMUSCULAR | Status: DC | PRN
  Administered 2014-10-16 (×3): 5 mg via INTRAVENOUS

## 2014-10-16 MED ORDER — SODIUM CHLORIDE 0.9 % IV SOLN
250.0000 mL | INTRAVENOUS | Status: DC
  Administered 2014-10-16: 250 mL via INTRAVENOUS

## 2014-10-16 MED ORDER — BISACODYL 10 MG RE SUPP: 10.0000 mg | Freq: Every day | RECTAL | Status: DC | PRN

## 2014-10-16 MED ORDER — HYDROMORPHONE HCL 1 MG/ML IJ SOLN
INTRAMUSCULAR | Status: AC
  Administered 2014-10-16: 0.5 mg
  Filled 2014-10-16: qty 1

## 2014-10-16 MED ORDER — METHOCARBAMOL 1000 MG/10ML IJ SOLN
500.0000 mg | Freq: Four times a day (QID) | INTRAVENOUS | Status: DC | PRN
  Filled 2014-10-16: qty 5

## 2014-10-16 MED ORDER — HYDROMORPHONE HCL 1 MG/ML IJ SOLN
INTRAMUSCULAR | Status: AC
  Filled 2014-10-16: qty 1

## 2014-10-16 MED ORDER — SODIUM CHLORIDE 0.9 % IJ SOLN
INTRAMUSCULAR | Status: AC
  Filled 2014-10-16: qty 10

## 2014-10-16 MED ORDER — LACTATED RINGERS IV SOLN
INTRAVENOUS | Status: DC
  Administered 2014-10-16 (×2): via INTRAVENOUS

## 2014-10-16 MED ORDER — POLYETHYLENE GLYCOL 3350 17 G PO PACK: 17.0000 g | PACK | Freq: Every day | ORAL | Status: DC | PRN

## 2014-10-16 MED ORDER — SENNA 8.6 MG PO TABS
1.0000 | ORAL_TABLET | Freq: Two times a day (BID) | ORAL | Status: DC
  Administered 2014-10-16 – 2014-10-17 (×2): 8.6 mg via ORAL
  Filled 2014-10-16 (×2): qty 1

## 2014-10-16 MED ORDER — LIDOCAINE HCL (CARDIAC) 20 MG/ML IV SOLN
INTRAVENOUS | Status: DC | PRN
  Administered 2014-10-16: 100 mg via INTRAVENOUS

## 2014-10-16 MED ORDER — METOPROLOL TARTRATE 25 MG PO TABS
25.0000 mg | ORAL_TABLET | Freq: Two times a day (BID) | ORAL | Status: DC
  Administered 2014-10-16 – 2014-10-17 (×2): 25 mg via ORAL
  Filled 2014-10-16 (×2): qty 1

## 2014-10-16 MED ORDER — MIDAZOLAM HCL 2 MG/2ML IJ SOLN
INTRAMUSCULAR | Status: AC
  Filled 2014-10-16: qty 4

## 2014-10-16 MED ORDER — DEXAMETHASONE SODIUM PHOSPHATE 10 MG/ML IJ SOLN
INTRAMUSCULAR | Status: DC | PRN
  Administered 2014-10-16: 10 mg via INTRAVENOUS

## 2014-10-16 MED ORDER — KETOROLAC TROMETHAMINE 15 MG/ML IJ SOLN
15.0000 mg | Freq: Four times a day (QID) | INTRAMUSCULAR | Status: DC
  Administered 2014-10-16 – 2014-10-17 (×3): 15 mg via INTRAVENOUS
  Filled 2014-10-16 (×3): qty 1

## 2014-10-16 MED ORDER — SODIUM CHLORIDE 0.9 % IJ SOLN
3.0000 mL | Freq: Two times a day (BID) | INTRAMUSCULAR | Status: DC
Start: 2014-10-16 — End: 2014-10-17
  Administered 2014-10-16: 3 mL via INTRAVENOUS

## 2014-10-16 MED ORDER — PRAVASTATIN SODIUM 40 MG PO TABS
40.0000 mg | ORAL_TABLET | Freq: Every day | ORAL | Status: DC
  Administered 2014-10-16: 40 mg via ORAL
  Filled 2014-10-16: qty 1

## 2014-10-16 MED ORDER — MIDAZOLAM HCL 5 MG/5ML IJ SOLN
INTRAMUSCULAR | Status: DC | PRN
  Administered 2014-10-16: 2 mg via INTRAVENOUS

## 2014-10-16 MED ORDER — HYDROCODONE-ACETAMINOPHEN 5-325 MG PO TABS
1.0000 | ORAL_TABLET | ORAL | Status: DC | PRN
  Administered 2014-10-17: 2 via ORAL
  Filled 2014-10-16: qty 2

## 2014-10-16 MED ORDER — SODIUM CHLORIDE 0.9 % IJ SOLN: 3.0000 mL | INTRAMUSCULAR | Status: DC | PRN

## 2014-10-16 MED ORDER — HYDROMORPHONE HCL 1 MG/ML IJ SOLN
0.5000 mg | INTRAMUSCULAR | Status: DC | PRN
  Administered 2014-10-16: 1 mg via INTRAVENOUS
  Filled 2014-10-16: qty 1

## 2014-10-16 MED ORDER — METHOCARBAMOL 500 MG PO TABS
500.0000 mg | ORAL_TABLET | Freq: Four times a day (QID) | ORAL | Status: DC | PRN
  Administered 2014-10-16: 500 mg via ORAL
  Filled 2014-10-16 (×2): qty 1

## 2014-10-16 MED ORDER — ONDANSETRON HCL 4 MG/2ML IJ SOLN: 4.0000 mg | Freq: Once | INTRAMUSCULAR | Status: DC | PRN

## 2014-10-16 MED ORDER — FENTANYL CITRATE (PF) 100 MCG/2ML IJ SOLN
INTRAMUSCULAR | Status: DC | PRN
  Administered 2014-10-16: 50 ug via INTRAVENOUS
  Administered 2014-10-16: 100 ug via INTRAVENOUS
  Administered 2014-10-16 (×3): 50 ug via INTRAVENOUS

## 2014-10-16 MED ORDER — MEPERIDINE HCL 25 MG/ML IJ SOLN: 6.2500 mg | INTRAMUSCULAR | Status: DC | PRN

## 2014-10-16 MED ORDER — ARTIFICIAL TEARS OP OINT
TOPICAL_OINTMENT | OPHTHALMIC | Status: AC
  Filled 2014-10-16: qty 3.5

## 2014-10-16 MED ORDER — ROCURONIUM BROMIDE 100 MG/10ML IV SOLN
INTRAVENOUS | Status: DC | PRN
Start: 2014-10-16 — End: 2014-10-16
  Administered 2014-10-16: 50 mg via INTRAVENOUS

## 2014-10-16 SURGICAL SUPPLY — 65 items
ADH SKN CLS APL DERMABOND .7 (GAUZE/BANDAGES/DRESSINGS) ×1
BAG DECANTER FOR FLEXI CONT (MISCELLANEOUS) ×3 IMPLANT
BLADE CLIPPER SURG (BLADE) IMPLANT
BONE MATRIX OSTEOCEL PRO MED (Bone Implant) ×2 IMPLANT
BUR MATCHSTICK NEURO 3.0 LAGG (BURR) ×3 IMPLANT
CAGE COROENT LRG MP 8X9X28-12 (Cage) ×4 IMPLANT
CANISTER SUCT 3000ML PPV (MISCELLANEOUS) ×3 IMPLANT
CONT SPEC 4OZ CLIKSEAL STRL BL (MISCELLANEOUS) ×3 IMPLANT
COVER BACK TABLE 60X90IN (DRAPES) ×3 IMPLANT
DECANTER SPIKE VIAL GLASS SM (MISCELLANEOUS) ×3 IMPLANT
DERMABOND ADVANCED (GAUZE/BANDAGES/DRESSINGS) ×2
DERMABOND ADVANCED .7 DNX12 (GAUZE/BANDAGES/DRESSINGS) ×1 IMPLANT
DRAPE C-ARM 42X72 X-RAY (DRAPES) ×6 IMPLANT
DRAPE LAPAROTOMY 100X72X124 (DRAPES) ×3 IMPLANT
DRAPE POUCH INSTRU U-SHP 10X18 (DRAPES) ×3 IMPLANT
DRAPE PROXIMA HALF (DRAPES) IMPLANT
DRSG OPSITE POSTOP 4X8 (GAUZE/BANDAGES/DRESSINGS) ×2 IMPLANT
DURAPREP 26ML APPLICATOR (WOUND CARE) ×3 IMPLANT
ELECT REM PT RETURN 9FT ADLT (ELECTROSURGICAL) ×3
ELECTRODE REM PT RTRN 9FT ADLT (ELECTROSURGICAL) ×1 IMPLANT
GAUZE SPONGE 4X4 12PLY STRL (GAUZE/BANDAGES/DRESSINGS) ×3 IMPLANT
GAUZE SPONGE 4X4 16PLY XRAY LF (GAUZE/BANDAGES/DRESSINGS) IMPLANT
GLOVE BIOGEL PI IND STRL 7.0 (GLOVE) IMPLANT
GLOVE BIOGEL PI IND STRL 7.5 (GLOVE) IMPLANT
GLOVE BIOGEL PI IND STRL 8.5 (GLOVE) ×2 IMPLANT
GLOVE BIOGEL PI INDICATOR 7.0 (GLOVE) ×10
GLOVE BIOGEL PI INDICATOR 7.5 (GLOVE) ×2
GLOVE BIOGEL PI INDICATOR 8.5 (GLOVE) ×4
GLOVE ECLIPSE 7.0 STRL STRAW (GLOVE) ×2 IMPLANT
GLOVE ECLIPSE 7.5 STRL STRAW (GLOVE) ×2 IMPLANT
GLOVE ECLIPSE 8.5 STRL (GLOVE) ×6 IMPLANT
GLOVE EXAM NITRILE LRG STRL (GLOVE) IMPLANT
GLOVE EXAM NITRILE MD LF STRL (GLOVE) IMPLANT
GLOVE EXAM NITRILE XL STR (GLOVE) IMPLANT
GLOVE EXAM NITRILE XS STR PU (GLOVE) IMPLANT
GOWN STRL REUS W/ TWL LRG LVL3 (GOWN DISPOSABLE) IMPLANT
GOWN STRL REUS W/ TWL XL LVL3 (GOWN DISPOSABLE) IMPLANT
GOWN STRL REUS W/TWL 2XL LVL3 (GOWN DISPOSABLE) ×6 IMPLANT
GOWN STRL REUS W/TWL LRG LVL3 (GOWN DISPOSABLE) ×9
GOWN STRL REUS W/TWL XL LVL3 (GOWN DISPOSABLE) ×3
HEMOSTAT POWDER KIT SURGIFOAM (HEMOSTASIS) ×2 IMPLANT
KIT BASIN OR (CUSTOM PROCEDURE TRAY) ×3 IMPLANT
KIT ROOM TURNOVER OR (KITS) ×3 IMPLANT
NEEDLE HYPO 22GX1.5 SAFETY (NEEDLE) ×3 IMPLANT
NS IRRIG 1000ML POUR BTL (IV SOLUTION) ×3 IMPLANT
PACK FOAM VITOSS 5CC (Orthopedic Implant) ×2 IMPLANT
PACK LAMINECTOMY NEURO (CUSTOM PROCEDURE TRAY) ×3 IMPLANT
PAD ARMBOARD 7.5X6 YLW CONV (MISCELLANEOUS) ×9 IMPLANT
PATTIES SURGICAL .5 X1 (DISPOSABLE) ×1 IMPLANT
ROD RELINE-O LORD 5.5X40 (Rod) ×4 IMPLANT
SCREW LOCK RELINE 5.5 TULIP (Screw) ×8 IMPLANT
SCREW RELINE-O POLY 6.5X45 (Screw) ×8 IMPLANT
SPONGE LAP 4X18 X RAY DECT (DISPOSABLE) IMPLANT
SPONGE SURGIFOAM ABS GEL 100 (HEMOSTASIS) ×3 IMPLANT
SUT VIC AB 1 CT1 18XBRD ANBCTR (SUTURE) ×1 IMPLANT
SUT VIC AB 1 CT1 8-18 (SUTURE) ×6
SUT VIC AB 2-0 CP2 18 (SUTURE) ×5 IMPLANT
SUT VIC AB 3-0 SH 8-18 (SUTURE) ×5 IMPLANT
SYR 3ML LL SCALE MARK (SYRINGE) ×12 IMPLANT
TOWEL OR 17X24 6PK STRL BLUE (TOWEL DISPOSABLE) ×3 IMPLANT
TOWEL OR 17X26 10 PK STRL BLUE (TOWEL DISPOSABLE) ×3 IMPLANT
TRAP SPECIMEN MUCOUS 40CC (MISCELLANEOUS) ×3 IMPLANT
TRAY FOLEY CATH 16FRSI W/METER (SET/KITS/TRAYS/PACK) ×2 IMPLANT
TRAY FOLEY W/METER SILVER 14FR (SET/KITS/TRAYS/PACK) ×1 IMPLANT
WATER STERILE IRR 1000ML POUR (IV SOLUTION) ×3 IMPLANT

## 2014-10-16 NOTE — Progress Notes (Signed)
Patient ID: Ronnie Gregory, male   DOB: 1933/05/08, 81 y.o.   MRN: [id number] Vital signs are stable Motor function is intact Dressing is clean and dry Legs feel better Stable postop

## 2014-10-16 NOTE — Transfer of Care (Signed)
Immediate Anesthesia Transfer of Care Note  Patient: Ronnie Gregory  Procedure(s) Performed: Procedure(s) with comments: Lumbar one-two Posterior lumbar interbody fusion, exploration of fusion, removal of hardware  (N/A) - Lumbar one-two Posterior lumbar interbody fusion, exploration of fusion, removal of hardware   Patient Location: PACU  Anesthesia Type:General  Level of Consciousness: awake, alert  and oriented  Airway & Oxygen Therapy: Patient Spontanous Breathing and Patient connected to nasal cannula oxygen  Post-op Assessment: Report given to RN, Post -op Vital signs reviewed and stable and Patient moving all extremities X 4  Post vital signs: Reviewed and stable  Last Vitals:  Filed Vitals:   10/16/14 1519  BP: 150/62  Pulse:   Temp: 36.9 C  Resp:     Complications: No apparent anesthesia complications

## 2014-10-16 NOTE — Progress Notes (Signed)
PHARMACIST - PHYSICIAN ORDER COMMUNICATION  CONCERNING: P&T Medication Policy on Herbal Medications  DESCRIPTION:  This patient's order for:  Ronnie Gregory  has been noted.  This product(s) is classified as an "herbal" or natural product. Due to a lack of definitive safety studies or FDA approval, nonstandard manufacturing practices, plus the potential risk of unknown drug-drug interactions while on inpatient medications, the Pharmacy and Therapeutics Committee does not permit the use of "herbal" or natural products of this type within [facility].   ACTION TAKEN: The pharmacy department is unable to verify this order at this time and your patient has been informed of this safety policy. Please reevaluate patient's clinical condition at discharge and address if the herbal or natural product(s) should be resumed at that time.  Georgina Pillion, PharmD, BCPS Clinical Pharmacist Pager: [phone] 10/16/2014 6:18 PM

## 2014-10-16 NOTE — Progress Notes (Signed)
Pt is admitted to 85C09. Admission vital is stable

## 2014-10-16 NOTE — H&P (Signed)
Ronnie Gregory is an 81 y.o. male.   Chief Complaint: Back pain and bilateral leg pain and weakness HPI: [name] is a 81 year old individual who has had previous decompressive surgery about 20 years ago he underwent decompression at level of L3-L4 he did well for about 17 years and then little over 2 years ago presented with further symptoms of back pain and leg weakness is found to have a significant stenosis at the level of L2-L3 above his previous fusion we did decompressive surgery at that time and also effusion. He did well for the past year and a half to 2 years but again over the past 6 months is had progressive weakness with increasing pain in his back. He is now found to have a large central disc herniation at the level of L1-L2 along with a degenerative and rotatory listhesis that is causing severe stenosis. This is right at the level of the conus. He is been advised regarding the need for surgery.  Past Medical History  Diagnosis Date  . Hypertension   . Myocardial infarction     Dr. Caryn Section cardiologist  . Hypothyroidism   . Hypercholesteremia     Past Surgical History  Procedure Laterality Date  . Cardiac catheterization    . Coronary angioplasty      3 years ago  . Back surgery      x 3  . Appendectomy    . Skin surgery      skin cancer removed from forehead    No family history on file. Social History:  reports that he has never smoked. He does not have any smokeless tobacco history on file. He reports that he does not drink alcohol or use illicit drugs.  Allergies: No Known Allergies  No prescriptions prior to admission    No results found for this or any previous visit (from the past 48 hour(s)). No results found.  Review of Systems  HENT: Negative.   Eyes: Negative.   Respiratory: Negative.   Cardiovascular: Negative.   Gastrointestinal: Negative.   Genitourinary: Negative.   Musculoskeletal: Positive for back pain.  Skin:  Negative.   Neurological: Positive for tingling, sensory change and weakness.  Endo/Heme/Allergies: Negative.   Psychiatric/Behavioral: Negative.     There were no vitals taken for this visit. Physical Exam  Constitutional: He appears well-developed and well-nourished.  HENT:  Head: Normocephalic and atraumatic.  Eyes: Conjunctivae and EOM are normal. Pupils are equal, round, and reactive to light.  Neck: Normal range of motion. Neck supple.  Cardiovascular: Normal rate and regular rhythm.   Respiratory: Effort normal.  GI: Soft. Bowel sounds are normal.  Musculoskeletal:  Centralized back pain  Neurological: He is alert.  Proximal leg weakness in the iliopsoas and quadriceps absent reflexes in the patellae and the Achilles both upper extremity reflexes 1+ in the biceps triceps and brachioradialis  Skin: Skin is warm and dry.  Psychiatric: He has a normal mood and affect. His behavior is normal. Judgment and thought content normal.     Assessment/Plan Spinal stenosis herniated nucleus pulposus L1-L2, cauda equina syndrome.  Decompression and fusion L1-L2  ELSNER,HENRY J 10/16/2014, 7:45 AM

## 2014-10-16 NOTE — Op Note (Signed)
Date of surgery: 10/16/2014 Preoperative diagnosis: Lumbar spinal stenosis L1-L2 status post arthrodesis L2-L5, neurogenic claudication Postoperative diagnosis: Lumbar spinal stenosis L1-L2, status post arthrodesis L2-L5, neurogenic claudication Procedure: L1-L2 laminotomies and decompression of L1-L2 nerve roots in addition to central spinal canal with more work than require for simple posterior interbody arthrodesis. Posterior lumbar interbody arthrodesis L1-L2 with peek spacers local autograft and allograft. Pedicle screw fixation L1-L2. Posterior lateral arthrodesis with local autograft and allograft L1-L2. Removal of previously placed hardware at L2-L3 and exploration of arthrodesis.  Surgeon: Barnett Abu First assistant: Freda Jackson M.D. Anesthesia: Gen. endotracheal Indications: Ronnie Gregory is a 81 year old individual who 20 years ago had a decompression at L3-4 L4-5 for lumbar spinal stenosis. 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 He underwent surgical decompression and stabilization and did well. For last 6 months he had progressively worsening pain centrally in his low back and into his lower extremities. He's felt weak. His found to have a herniated nucleus pulposus in addition to a substantial retrolisthesis at the L1-L2 level causing severe stenosis is advised regarding the need for surgery.  Procedure: The patient was brought to the operating room supine on a stretcher. After the smooth induction of general endotracheal anesthesia, he was turned prone. The back was shaved prepped with alcohol DuraPrep and draped in a sterile fashion. A midline incision was made through his previously made incision this was carried down to the lumbar dorsal fascia. The fascia was opened on either side of the midline to expose the spinous processes. The dissection was carried out laterally until hardware was removed from the previous surgery this identified the L2 and L3  space positively. The hardware was uncovered it was loosened and then removed. Next attention was turned to L1-L2 were significant overgrown tissue around the facet joints was removed. Dissection was carried out to the L1 transverse process which was decorticated and packed off for later use in grafting to L2 which was also decorticated. This was done bilaterally. Laminotomies were then created removing the inferior margin lamina of L1 out to and including the entirety of the facet joint the ligament was taken up and the common dural tube was exposed identifying the path of the L1 nerve root superiorly and laterally and decompressing this with a 2 mm Kerrison punch the L2 nerve root was similarly identified into the foramen and this was decompressed. The disc space was noted to be bulging dorsally causing a significant indentation in the dura. From the right side then discectomy was performed removing a substantial quantity of severely degenerated and desiccated disc material in the subligamentous space then entering the disc space. A series of disc shavers were then used to release the disc from the endplates. This procedure though started on the right was then continued on the left side and total discectomy was performed. Care was taken to protect the L2 nerve root superiorly and the L3 nerve root inferiorly which were each decompressed using a TUNA 3 mm Kerrison punch to remove substantial redundant ligamentous material in addition to material consistent with bony overgrowth. Once the decompression was completed and the discectomy was completed interspace was sized for appropriate size spacer was felt that a 12 lordotic spacer work best and this one was 8 mm tall 20 mm long with 12 of lordosis placed into the interspace after being packed with bone graft a total of 12 mL of bone was packed into the interspace with 3 separate 3 mL syringes and  1 additional syringe being used after the cages were placed. Then  bilateral gutters having previously been decorticated were uncovered pedicle entry sites were chosen at L1 6.5 x 45 mm pedicle screws were placed in L1 bilaterally under fluoroscopic guidance 6.5 x 45 mm screws were then placed into the holes at L2. 40 mm precontoured rods were used to connect the screw has together. Once the screw heads were connected the lateral gutters which were previously decorticated were packed with the cost bone sponge. There was not enough autograft go-round and of the cost allograft was used to perform the posterior lateral fusion. Once this graft was in place care was taken to make sure that hemostasis and the soft tissues was adequate that the common dural tube and the take off of the L1-L2 nerve roots were well decompressed and that hemostasis in general was achieved. Lumbar dorsal fascia was then closed with #1 Vicryls in interrupted fashion, 20 Vicryls used in the subcutaneous tissues, 30 Vicryls used to close subcuticular skin. Asian tolerated procedure was returned to recovery room in stable condition blood loss was estimated at 250 mL.

## 2014-10-16 NOTE — Anesthesia Procedure Notes (Signed)
Procedure Name: Intubation Date/Time: 10/16/2014 12:24 PM Performed by: Glo Herring B Pre-anesthesia Checklist: Patient identified, Timeout performed, Emergency Drugs available, Suction available and Patient being monitored Patient Re-evaluated:Patient Re-evaluated prior to inductionOxygen Delivery Method: Circle system utilized Preoxygenation: Pre-oxygenation with 100% oxygen Intubation Type: IV induction Ventilation: Mask ventilation without difficulty and Oral airway inserted - appropriate to patient size Laryngoscope Size: Mac and 4 Grade View: Grade II Tube type: Oral Tube size: 7.5 mm Number of attempts: 1 Airway Equipment and Method: Stylet Placement Confirmation: positive ETCO2,  CO2 detector,  ETT inserted through vocal cords under direct vision and breath sounds checked- equal and bilateral Secured at: 23 cm Tube secured with: Tape Dental Injury: Teeth and Oropharynx as per pre-operative assessment

## 2014-10-16 NOTE — Anesthesia Preprocedure Evaluation (Signed)
Anesthesia Evaluation  Patient identified by MRN, date of birth, ID band Patient awake    Reviewed: Allergy & Precautions, NPO status , Patient's Chart, lab work & pertinent test results  Airway Mallampati: I  TM Distance: >3 FB Neck ROM: Full    Dental   Pulmonary    Pulmonary exam normal        Cardiovascular hypertension, Pt. on medications + CAD and + Past MI  Normal cardiovascular exam     Neuro/Psych    GI/Hepatic   Endo/Other    Renal/GU      Musculoskeletal   Abdominal   Peds  Hematology   Anesthesia Other Findings   Reproductive/Obstetrics                             Anesthesia Physical Anesthesia Plan  ASA: III  Anesthesia Plan: General   Post-op Pain Management:    Induction: Intravenous  Airway Management Planned: Oral ETT  Additional Equipment:   Intra-op Plan:   Post-operative Plan: Extubation in OR  Informed Consent: I have reviewed the patients History and Physical, chart, labs and discussed the procedure including the risks, benefits and alternatives for the proposed anesthesia with the patient or authorized representative who has indicated his/her understanding and acceptance.     Plan Discussed with: CRNA and Surgeon  Anesthesia Plan Comments:         Anesthesia Quick Evaluation

## 2014-10-17 MED ORDER — OXYCODONE-ACETAMINOPHEN 5-325 MG PO TABS: 1.0000 | ORAL_TABLET | ORAL | Status: DC | PRN

## 2014-10-17 MED ORDER — METHOCARBAMOL 500 MG PO TABS: 500.0000 mg | ORAL_TABLET | Freq: Four times a day (QID) | ORAL | Status: DC | PRN

## 2014-10-17 MED FILL — Sodium Chloride IV Soln 0.9%: INTRAVENOUS | Qty: 1000 | Status: AC

## 2014-10-17 MED FILL — Heparin Sodium (Porcine) Inj 1000 Unit/ML: INTRAMUSCULAR | Qty: 30 | Status: AC

## 2014-10-17 NOTE — Care Management Note (Signed)
Case Management Note  Patient Details  Name: Ronnie Gregory MRN: [id number] Date of Birth: 03-14-1933  Subjective/Objective:                    Action/Plan: Patient admitted for L 3-5 PLIF. Patient from home alone but has a friend that helps him daily. CM met with the patient for PT recommendations that patient have home health PT. Patient at this time refuses home health PT. He understands that if he changes his mind he can inform Dr Ellene Route or his primary MD to have Home health ordered. Patient also have multiple DME at home and does not feel he needs anything new. Bedside RN aware.   Expected Discharge Date:                  Expected Discharge Plan:  Home/Self Care  In-House Referral:     Discharge planning Services  CM Consult  Post Acute Care Choice:    Choice offered to:     DME Arranged:    DME Agency:     HH Arranged:    [place] Agency:     Status of Service:  Completed, signed off  Medicare Important Message Given:    Date Medicare IM Given:    Medicare IM give by:    Date Additional Medicare IM Given:    Additional Medicare Important Message give by:     If discussed at [place] of Stay Meetings, dates discussed:    Additional Comments:  Pollie Friar, RN 10/17/2014, 10:38 AM

## 2014-10-17 NOTE — Progress Notes (Signed)
Occupational Therapy Evaluation Patient Details Name: Ronnie Gregory MRN: [id number] DOB: Mar 10, 1933 Today's Date: 10/17/2014    History of Present Illness pt presents with L1-2 Lami and Fusion with hx of MI and HTN.     Clinical Impression   Pt admitted with the above diagnoses and presents with below problem list. Pt will benefit from continued acute OT to address the below listed deficits and maximize independence with BADLs prior to d/c home. PTA pt was independent with ADLs. Pt is currently supervision to min A with ADLs. ADL education provided. Pt has family/friends to assist at d/c. Session details below. OT to continue to follow acutely.      Follow Up Recommendations  Supervision - Intermittent;Other (comment);No OT follow up (OOB/mobility)    Equipment Recommendations  None recommended by OT    Recommendations for Other Services       Precautions / Restrictions Precautions Precautions: Back Precaution Booklet Issued: Yes (comment) Precaution Comments: Reviewed back precautions. Required Braces or Orthoses: Spinal Brace Spinal Brace: Lumbar corset;Applied in sitting position Restrictions Weight Bearing Restrictions: No      Mobility Bed Mobility Overal bed mobility: Needs Assistance Bed Mobility: Rolling;Sidelying to Sit Rolling: Supervision Sidelying to sit: Supervision       General bed mobility comments: in recliner, verbalized log roll technique  Transfers Overall transfer level: Needs assistance Equipment used: None Transfers: Sit to/from Stand Sit to Stand: Supervision         General transfer comment: good technique    Balance Overall balance assessment: Needs assistance Sitting-balance support: No upper extremity supported;Feet supported Sitting balance-Leahy Scale: Good     Standing balance support: No upper extremity supported;During functional activity Standing balance-Leahy Scale: Good                               ADL Overall ADL's : Needs assistance/impaired Eating/Feeding: Set up;Sitting   Grooming: Supervision/safety;Standing;Cueing for compensatory techniques   Upper Body Bathing: Set up;Sitting;Cueing for compensatory techniques   Lower Body Bathing: Sit to/from stand;Min guard;With adaptive equipment Lower Body Bathing Details (indicate cue type and reason): uses AE at baseline Upper Body Dressing : Set up;Cueing for compensatory techniques;Sitting;Supervision/safety Upper Body Dressing Details (indicate cue type and reason): assist to adjust back brace Lower Body Dressing: Min guard;With adaptive equipment;Sit to/from stand Lower Body Dressing Details (indicate cue type and reason): has AE, reports girlfriend will help don socks Toilet Transfer: Ambulation;Min guard;BSC Toilet Transfer Details (indicate cue type and reason): BSC over toilet Toileting- Clothing Manipulation and Hygiene: Minimal assistance;Sitting/lateral lean Toileting - Clothing Manipulation Details (indicate cue type and reason): educated on AE/compensatory strategies Tub/ Engineer, structural: Ambulation;Minimal assistance;Tub transfer   Functional mobility during ADLs: Min guard General ADL Comments: Pt completed in-room functional mobility and toilet transfers as detailed above. Pt with some diifficulty accessing feet in sitting position. ADL education provided and BAT precautions reviewed.      Vision     Perception     Praxis      Pertinent Vitals/Pain Pain Assessment: 0-10 Pain Score:  ("it's not bad" no numerical value given) Pain Location: back Pain Descriptors / Indicators: Sore;Tightness Pain Intervention(s): Monitored during session     Hand Dominance     Extremity/Trunk Assessment Upper Extremity Assessment Upper Extremity Assessment: Overall WFL for tasks assessed   Lower Extremity Assessment Lower Extremity Assessment: Defer to PT evaluation   Cervical / Trunk Assessment Cervical / Trunk  Assessment: Kyphotic  Communication Communication Communication: No difficulties   Cognition Arousal/Alertness: Awake/alert Behavior During Therapy: WFL for tasks assessed/performed Overall Cognitive Status: Within Functional Limits for tasks assessed                     General Comments       Exercises       Shoulder Instructions      Home Living Family/patient expects to be discharged to:: Private residence Living Arrangements: Spouse/significant other Available Help at Discharge: Friend(s);Available PRN/intermittently Type of Home: House Home Access: Level entry     Home Layout: One level     Bathroom Shower/Tub: Chief Strategy Officer: Handicapped height     Home Equipment: Environmental consultant - 2 wheels;Cane - single point;Bedside commode;Wheelchair - manual;Tub bench;Hand held shower head;Adaptive equipment Adaptive Equipment: Reacher;Sock aid;Long-handled sponge        Prior Functioning/Environment Level of Independence: Independent             OT Diagnosis: Acute pain   OT Problem List: Impaired balance (sitting and/or standing);Decreased knowledge of use of DME or AE;Decreased knowledge of precautions;Pain   OT Treatment/Interventions: Self-care/ADL training;DME and/or AE instruction;Therapeutic activities;Patient/family education;Balance training    OT Goals(Current goals can be found in the care plan section) Acute Rehab OT Goals Patient Stated Goal: Back to doing things for himself. OT Goal Formulation: With patient Time For Goal Achievement: 10/24/14 Potential to Achieve Goals: Good ADL Goals Pt Will Perform Grooming: with modified independence;standing Pt Will Perform Lower Body Bathing: with modified independence;with adaptive equipment;sit to/from stand Pt Will Perform Lower Body Dressing: with modified independence;with adaptive equipment;sit to/from stand Pt Will Transfer to Toilet: with modified independence;ambulating (3n1 over  toilet) Pt Will Perform Toileting - Clothing Manipulation and hygiene: with modified independence;with adaptive equipment;sitting/lateral leans;sit to/from stand Pt Will Perform Tub/Shower Transfer: Tub transfer;with modified independence;tub bench  OT Frequency: Min 2X/week   Barriers to D/C:            Co-evaluation              End of Session Equipment Utilized During Treatment: Back brace  Activity Tolerance: Patient tolerated treatment well Patient left: in chair;with call bell/phone within reach   Time: 1029-1047 OT Time Calculation (min): 18 min Charges:  OT General Charges $OT Visit: 1 Procedure OT Evaluation $Initial OT Evaluation Tier I: 1 Procedure G-Codes:    Pilar Grammes Nov 13, 2014, 10:59 AM

## 2014-10-17 NOTE — Progress Notes (Signed)
Patient ID: Ronnie Gregory, male   DOB: 04/14/1933, 81 y.o.   MRN: [id number] Vital signs are stable Motor function is intact Patient is feeling quite well Possible discharge this afternoon

## 2014-10-17 NOTE — Evaluation (Signed)
Physical Therapy Evaluation Patient Details Name: Ronnie Gregory MRN: [id number] DOB: 1933/08/26 Today's Date: 10/17/2014   History of Present Illness  pt presents with L1-2 Lami and Fusion with hx of MI and HTN.    Clinical Impression  Pt moving well, just mildly unsteady and pt indicates this is baseline for him.  Pt will have good help at D/C and already has DME.  Will continue to follow if remains on acute.      Follow Up Recommendations Home health PT;Supervision - Intermittent    Equipment Recommendations  None recommended by PT    Recommendations for Other Services       Precautions / Restrictions Precautions Precautions: Back Precaution Booklet Issued: No Precaution Comments: Reviewed back precautions. Required Braces or Orthoses: Spinal Brace Spinal Brace: Lumbar corset;Applied in sitting position Restrictions Weight Bearing Restrictions: No      Mobility  Bed Mobility Overal bed mobility: Needs Assistance Bed Mobility: Rolling;Sidelying to Sit Rolling: Supervision Sidelying to sit: Supervision       General bed mobility comments: pt needs cues for log roll technique, but no physical A needed.    Transfers Overall transfer level: Needs assistance Equipment used: None Transfers: Sit to/from Stand Sit to Stand: Supervision         General transfer comment: pt demonstrtes good use of UEs and awareness of safety.    Ambulation/Gait Ambulation/Gait assistance: Supervision Ambulation Distance (Feet): 250 Feet Assistive device: None Gait Pattern/deviations: Step-through pattern;Decreased stride length     General Gait Details: cues for more upright psoture and use of cane for outdoor ambulation.    Stairs            Wheelchair Mobility    Modified Rankin (Stroke Patients Only)       Balance Overall balance assessment: Needs assistance Sitting-balance support: No upper extremity supported;Feet supported Sitting balance-Leahy Scale:  Good     Standing balance support: No upper extremity supported;During functional activity Standing balance-Leahy Scale: Good                               Pertinent Vitals/Pain Pain Assessment: 0-10 Pain Score: 2  Pain Location: Back Pain Descriptors / Indicators: Sore;Tightness Pain Intervention(s): Monitored during session;Repositioned    Home Living Family/patient expects to be discharged to:: Private residence Living Arrangements: Spouse/significant other Available Help at Discharge: Friend(s);Available PRN/intermittently Type of Home: House Home Access: Level entry     Home Layout: One level Home Equipment: Walker - 2 wheels;Cane - single point;Bedside commode;Shower seat;Wheelchair - manual      Prior Function Level of Independence: Independent               Hand Dominance        Extremity/Trunk Assessment   Upper Extremity Assessment: Defer to OT evaluation           Lower Extremity Assessment: Overall WFL for tasks assessed      Cervical / Trunk Assessment: Kyphotic  Communication   Communication: No difficulties  Cognition Arousal/Alertness: Awake/alert Behavior During Therapy: WFL for tasks assessed/performed Overall Cognitive Status: Within Functional Limits for tasks assessed                      General Comments      Exercises        Assessment/Plan    PT Assessment Patient needs continued PT services  PT Diagnosis Difficulty walking   PT Problem  List Decreased activity tolerance;Decreased balance;Decreased mobility;Decreased knowledge of use of DME;Decreased knowledge of precautions  PT Treatment Interventions DME instruction;Gait training;Functional mobility training;Therapeutic activities;Therapeutic exercise;Balance training;Neuromuscular re-education;Patient/family education   PT Goals (Current goals can be found in the Care Plan section) Acute Rehab PT Goals Patient Stated Goal: Back to doing things  for himself. PT Goal Formulation: With patient Time For Goal Achievement: 10/24/14 Potential to Achieve Goals: Good    Frequency Min 5X/week   Barriers to discharge        Co-evaluation               End of Session Equipment Utilized During Treatment: Back brace Activity Tolerance: Patient tolerated treatment well Patient left: in chair;with call bell/phone within reach Nurse Communication: Mobility status         Time: 0920-0950 PT Time Calculation (min) (ACUTE ONLY): 30 min   Charges:   PT Evaluation $Initial PT Evaluation Tier I: 1 Procedure PT Treatments $Gait Training: 8-22 mins   PT G CodesSunny Schlein, [place] [phone] 10/17/2014, 10:05 AM

## 2014-10-17 NOTE — Discharge Summary (Signed)
Physician Discharge Summary  Patient ID: Ronnie Gregory MRN: [id number] DOB/AGE: 1933-05-21 81 y.o.  Admit date: 10/16/2014 Discharge date: 10/17/2014  Admission Diagnoses: Lumbar stenosis L1-L2 with neurogenic claudication, lumbar radiculopathy  Discharge Diagnoses: Lumbar stenosis L1-L2 with neurogenic claudication, lumbar radiculopathy  Active Problems:   Spinal stenosis, lumbar region, with neurogenic claudication   Discharged Condition: good  Hospital Course: A shunt was admitted to undergo surgical decompression arthrodesis at L1-L2. He tolerated surgery well  Consults: None  Significant Diagnostic Studies: None  Treatments: surgery: Decompression of L1-L2 posterior lumbar interbody arthrodesis with peek spacers local autograft and allograft pedicle screw fixation L1-L2, removal of hardware L2-3 exploration of arthrodesis L2 L3  Discharge Exam: Blood pressure 122/61, pulse 62, temperature 97.9 F (36.6 C), temperature source Oral, resp. rate 18, height  (1.778 m), weight 89.721 kg (197 lb 12.8 oz), SpO2 96 %. Incision is clean and dry. Motor function is intact. Station and gait is intact.  Disposition: 01-Home or Self Care  Discharge Instructions    Call MD for:  redness, tenderness, or signs of infection (pain, swelling, redness, odor or green/yellow discharge around incision site)    Complete by:  As directed      Call MD for:  severe uncontrolled pain    Complete by:  As directed      Call MD for:  temperature >100.4    Complete by:  As directed      Diet - low sodium heart healthy    Complete by:  As directed      Discharge instructions    Complete by:  As directed   Okay to shower. Do not apply salves or appointments to incision. No heavy lifting with the upper extremities greater than 15 pounds. May resume driving when not requiring pain medication and patient feels comfortable with doing so.     Increase activity slowly    Complete by:  As directed              Medication List    TAKE these medications        aspirin EC 81 MG tablet  Take 81 mg by mouth daily.     CASCARA SAGRADA PO  Take 1 tablet by mouth at bedtime. Stool softner     Fish Oil 600 MG Caps  Take 1,800 mg by mouth daily.     levothyroxine 75 MCG tablet  Commonly known as:  SYNTHROID, LEVOTHROID  Take 75 mcg by mouth daily before breakfast.     lisinopril 5 MG tablet  Commonly known as:  PRINIVIL,ZESTRIL  Take 5 mg by mouth daily.     methocarbamol 500 MG tablet  Commonly known as:  ROBAXIN  Take 1 tablet (500 mg total) by mouth every 6 (six) hours as needed for muscle spasms.     metoprolol tartrate 25 MG tablet  Commonly known as:  LOPRESSOR  Take 25 mg by mouth 2 (two) times daily.     oxyCODONE-acetaminophen 5-325 MG tablet  Commonly known as:  PERCOCET/ROXICET  Take 1-2 tablets by mouth every 4 (four) hours as needed for moderate pain.     pravastatin 40 MG tablet  Commonly known as:  PRAVACHOL  Take 40 mg by mouth at bedtime.         SignedStefani Dama 10/17/2014, 9:12 AM

## 2014-10-18 NOTE — Anesthesia Postprocedure Evaluation (Signed)
Anesthesia Post Note  Patient: Ronnie Gregory  Procedure(s) Performed: Procedure(s) (LRB): Lumbar one-two Posterior lumbar interbody fusion, exploration of fusion, removal of hardware  (N/A)  Anesthesia type: general  Patient location: PACU  Post pain: Pain level controlled  Post assessment: Patient's Cardiovascular Status Stable  Last Vitals:  Filed Vitals:   10/17/14 1000  BP: 118/62  Pulse:   Temp:   Resp:     Post vital signs: Reviewed and stable  Level of consciousness: sedated  Complications: No apparent anesthesia complications

## 2014-10-30 ENCOUNTER — Other Ambulatory Visit: Payer: Self-pay | Admitting: Neurological Surgery

## 2014-10-30 DIAGNOSIS — M48061 Spinal stenosis, lumbar region without neurogenic claudication: Secondary | ICD-10-CM

## 2014-11-07 ENCOUNTER — Ambulatory Visit
Admission: RE | Admit: 2014-11-07 | Discharge: 2014-11-07 | Disposition: A | Discharge status: Home / Self Care | Payer: Medicare Other | Source: Ambulatory Visit | Attending: Neurological Surgery | Admitting: Neurological Surgery

## 2014-11-07 DIAGNOSIS — M48061 Spinal stenosis, lumbar region without neurogenic claudication: Secondary | ICD-10-CM

## 2014-11-15 ENCOUNTER — Emergency Department (HOSPITAL_COMMUNITY)
Admission: EM | Admit: 2014-11-15 | Discharge: 2014-11-15 | Disposition: A | Discharge status: Home / Self Care | Payer: Medicare Other | Attending: Emergency Medicine | Admitting: Emergency Medicine

## 2014-11-15 ENCOUNTER — Encounter (HOSPITAL_COMMUNITY): Payer: Self-pay | Admitting: Emergency Medicine

## 2014-11-15 ENCOUNTER — Emergency Department (HOSPITAL_COMMUNITY): Payer: Medicare Other

## 2014-11-15 DIAGNOSIS — M545 Low back pain: Secondary | ICD-10-CM | POA: Insufficient documentation

## 2014-11-15 DIAGNOSIS — Z7982 Long term (current) use of aspirin: Secondary | ICD-10-CM | POA: Diagnosis not present

## 2014-11-15 DIAGNOSIS — E039 Hypothyroidism, unspecified: Secondary | ICD-10-CM | POA: Insufficient documentation

## 2014-11-15 DIAGNOSIS — E7800 Pure hypercholesterolemia, unspecified: Secondary | ICD-10-CM | POA: Insufficient documentation

## 2014-11-15 DIAGNOSIS — R1110 Vomiting, unspecified: Secondary | ICD-10-CM | POA: Diagnosis not present

## 2014-11-15 DIAGNOSIS — I252 Old myocardial infarction: Secondary | ICD-10-CM | POA: Insufficient documentation

## 2014-11-15 DIAGNOSIS — R6883 Chills (without fever): Secondary | ICD-10-CM | POA: Diagnosis not present

## 2014-11-15 DIAGNOSIS — R451 Restlessness and agitation: Secondary | ICD-10-CM | POA: Diagnosis present

## 2014-11-15 DIAGNOSIS — R410 Disorientation, unspecified: Secondary | ICD-10-CM | POA: Insufficient documentation

## 2014-11-15 DIAGNOSIS — Z79899 Other long term (current) drug therapy: Secondary | ICD-10-CM | POA: Diagnosis not present

## 2014-11-15 DIAGNOSIS — I10 Essential (primary) hypertension: Secondary | ICD-10-CM | POA: Diagnosis not present

## 2014-11-15 DIAGNOSIS — R300 Dysuria: Secondary | ICD-10-CM | POA: Diagnosis not present

## 2014-11-15 LAB — BASIC METABOLIC PANEL
Anion gap: 11 (ref 5–15)
BUN: 20 mg/dL (ref 6–20)
CO2: 20 mmol/L — ABNORMAL LOW (ref 22–32)
Calcium: 8.9 mg/dL (ref 8.9–10.3)
Chloride: 103 mmol/L (ref 101–111)
Creatinine, Ser: 1.01 mg/dL (ref 0.61–1.24)
GFR calc Af Amer: >60 mL/min (ref >60)
GFR calc non Af Amer: >60 mL/min (ref >60)
Glucose, Bld: 140 mg/dL — ABNORMAL HIGH (ref 65–99)
Potassium: 3.5 mmol/L (ref 3.5–5.1)
Sodium: 134 mmol/L — ABNORMAL LOW (ref 135–145)

## 2014-11-15 LAB — CBC WITH DIFFERENTIAL/PLATELET
Basophils Absolute: 0 10*3/uL (ref 0.0–0.1)
Basophils Relative: 0 %
Eosinophils Absolute: 0 10*3/uL (ref 0.0–0.7)
Eosinophils Relative: 0 %
HCT: 37.9 % — ABNORMAL LOW (ref 39.0–52.0)
Hemoglobin: 13 g/dL (ref 13.0–17.0)
Lymphocytes Relative: 13 %
Lymphs Abs: 1.3 10*3/uL (ref 0.7–4.0)
MCH: 32.3 pg (ref 26.0–34.0)
MCHC: 34.3 g/dL (ref 30.0–36.0)
MCV: 94.3 fL (ref 78.0–100.0)
Monocytes Absolute: 0.8 10*3/uL (ref 0.1–1.0)
Monocytes Relative: 8 %
Neutro Abs: 7.7 10*3/uL (ref 1.7–7.7)
Neutrophils Relative %: 79 %
Platelets: 304 10*3/uL (ref 150–400)
RBC: 4.02 MIL/uL — ABNORMAL LOW (ref 4.22–5.81)
RDW: 12.4 % (ref 11.5–15.5)
WBC: 9.8 10*3/uL (ref 4.0–10.5)

## 2014-11-15 LAB — URINALYSIS, ROUTINE W REFLEX MICROSCOPIC
Bilirubin Urine: NEGATIVE
Glucose, UA: NEGATIVE mg/dL
Hgb urine dipstick: NEGATIVE
Leukocytes, UA: NEGATIVE
Nitrite: NEGATIVE
Protein, ur: NEGATIVE mg/dL
Specific Gravity, Urine: 1.025 (ref 1.005–1.030)
Urobilinogen, UA: 0.2 mg/dL (ref 0.0–1.0)
pH: 6 (ref 5.0–8.0)

## 2014-11-15 LAB — CBG MONITORING, ED: Glucose-Capillary: 136 mg/dL — ABNORMAL HIGH (ref 65–99)

## 2014-11-15 MED ORDER — LORAZEPAM 2 MG/ML IJ SOLN
1.0000 mg | Freq: Once | INTRAMUSCULAR | Status: AC
  Administered 2014-11-15: 1 mg via INTRAMUSCULAR
  Filled 2014-11-15: qty 1

## 2014-11-15 NOTE — ED Notes (Signed)
Pt c/o increased back pain since having back surgery last week. Pt alert and oriented. Pt diaphoretic/nauseous and writhing on stretcher due to pain. EDP at bedside. [id number].

## 2014-11-15 NOTE — Discharge Instructions (Signed)
Delirium °Delirium is a state of mental confusion. It comes on quickly and causes significant changes in a person's thinking and behavior. People with delirium usually have trouble paying attention to what is going on or knowing where they are. They may become very withdrawn or very emotional and unable to sit still. They may even see or feel things that are not there (hallucinations). Delirium is a sign of a serious underlying medical condition. °CAUSES °Delirium occurs when something suddenly affects the signals that the brain sends out. Brain signals can be affected by anything that puts severe stress on the body and brain and causes brain chemicals to be out of balance. The most common causes of delirium include: °· Infections. These may be bacterial, viral, fungal, or protozoal. °· Medicines. These include many over-the-counter and prescription medicines. °· Recreational drugs. °· Substance withdrawal. This occurs with sudden discontinuation of alcohol, certain medicines, or recreational drugs. °· Surgery. °· Sudden vascular events, such as stroke, brain hemorrhage, and severe migraine. °· Other brain disorders, such as tumors, seizures, and physical head trauma. °· Metabolic disorders, such as kidney or liver failure. °· Low blood oxygen (anoxia). This may occur with lung disease, cardiac arrest, or carbon monoxide poisoning. °· Hormone imbalances (endocrinopathies), such as an overactive thyroid (hyperthyroidism) or underactive thyroid (hypothyroidism). °· Vitamin deficiencies. °RISK FACTORS °This condition is more likely to develop in: °· Children. °· Older people. °· People who live alone. °· People who have vision loss or hearing loss. °· People who have existing brain disease, such as dementia. °· People who have long-lasting (chronic) medical conditions, such as heart disease. °· People who are hospitalized for long periods of time. °SYMPTOMS °Delirium starts with a sudden change in a person's thinking  or behavior. Symptoms come and go (fluctuate) over time, and they are often worse at the end of the day. Symptoms include: °· Not being able to stay awake (drowsiness) or pay attention. °· Being confused about places, time, and people. °· Forgetfulness. °· Having extreme energy levels. These may be low or high. °· Changes in sleep patterns. °· Extreme mood swings, such as anger or anxiety. °· Focusing on things or ideas that are not important. °· Rambling and senseless talking. °· Difficulty speaking, understanding speech, or both. °· Hallucinations. °· Tremor or unsteady gait. °DIAGNOSIS °People with delirium may not realize that they have the condition. Often, a family member or health care provider is the first person to notice the changes. The health care provider will obtain a detailed history of current symptoms, medical issues, medicines, and recreational drug use. The health care provider will perform a mental status examination by: °· Asking questions to check for confusion. °· Watching for abnormal behavior. °The health care provider may perform a physical exam and order lab tests or additional studies to determine the cause of the delirium. °TREATMENT °Treatment of delirium depends on the cause and severity. Delirium usually goes away within days or weeks of treating the underlying cause. In the meantime, the person should not be left alone because he or she may accidentally cause self-harm. Treatment includes supportive care, such as: °· Increased light during the day and decreased light at night. °· Low noise level. °· Uninterrupted sleep. °· A regular daily schedule. °· Clocks and calendars to help with orientation. °· Familiar objects, including the person's pictures and clothing. °· Frequent visits from familiar family and friends. °· Healthy diet. °· Exercise. °In more severe cases of delirium, medicine may be prescribed   to help the person to keep calm and think more clearly. °HOME CARE  INSTRUCTIONS °· Any supportive care should be continued as told by the health care provider. °· All medicines should be used as told by the health care provider. This is important. °· The health care provider should be consulted before over-the-counter medicines, herbs, or supplements are used. °· All follow-up visits should be kept as told by the health care provider. This is important. °· Alcohol and recreational drugs should be avoided as told by the health care provider. °SEEK MEDICAL CARE IF: °· Symptoms do not get better or they become worse. °· New symptoms of delirium develop. °· Caring for the person at home does not seem safe. °· Eating, drinking, or communicating stops. °· There are side effects of medicines, such as changes in sleep patterns, dizziness, weight gain, restlessness, movement changes, or tremors. °SEEK IMMEDIATE MEDICAL CARE IF: °· Serious thoughts occur about self-harm or about hurting others. °· There are serious side effects of medicine, such as: °¨ Swelling of the face, lips, tongue, or throat. °¨ Fever, confusion, muscle spasms, or seizures. °  °This information is not intended to replace advice given to you by your health care provider. Make sure you discuss any questions you have with your health care provider. °  °Document Released: 09/30/2011 Document Revised: 05/22/2014 Document Reviewed: 02/28/2014 °Elsevier Interactive Patient Education ©2016 [employer] ° °

## 2014-11-15 NOTE — ED Provider Notes (Signed)
CSN: [id number]     Arrival date & time 11/15/14  1049 History  By signing my name below, I, Ronnie Gregory, attest that this documentation has been prepared under the direction and in the presence of Ronnie RazorStephen  Grosser, MD. Electronically Signed: [name], ED Scribe. 11/15/2014. 11:06 AM.    Chief Complaint  Patient presents with  . Back Pain   Patient is a 81 y.o. male presenting with back pain. The history is provided by the patient. No language interpreter was used.  Back Pain Location:  Lumbar spine Quality:  Unable to specify Pain severity:  Moderate Pain is:  Same all the time Onset quality:  Gradual Duration:  3 days Timing:  Constant Progression:  Worsening Chronicity:  New Context comment:  Recent surgery Relieved by:  None tried Worsened by:  Nothing tried Ineffective treatments:  None tried Associated symptoms: dysuria   Risk factors: recent surgery    HPI Comments: Ronnie Gregory is a 81 y.o. male who presents to the Emergency Department complaining of gradually worsening right sided pain and back pain onset 3 days ago. Per daughter, pt began to feel agitated 2 days ago. He reports associated chills, vomiting, and dysuria but unable to expound on his urinary symptoms. He denies any SOB or leg pain. He explains that he recently had back surgery and before today he felt fine. He denies any recent injuries. Pt was placed on Robaxin.      Past Medical History  Diagnosis Date  . Hypertension   . Myocardial infarction [facility](HCC)     Dr. Caryn [place] cardiologist  . Hypothyroidism   . Hypercholesteremia    Past Surgical History  Procedure Laterality Date  . Cardiac catheterization    . Coronary angioplasty      3 years ago  . Back surgery      x 3  . Appendectomy    . Skin surgery      skin cancer removed from forehead   No family history on file. Social History  Substance Use Topics  . Smoking status: Never Smoker   . Smokeless tobacco: None  .  Alcohol Use: No    Review of Systems  Constitutional: Positive for chills.  Respiratory: Negative for shortness of breath.   Gastrointestinal: Positive for vomiting.  Genitourinary: Positive for dysuria.  Musculoskeletal: Positive for back pain.  All other systems reviewed and are negative.     Allergies  Review of patient's allergies indicates no known allergies.  Home Medications   Prior to Admission medications   Medication Sig Start Date End Date Taking? Authorizing Provider  aspirin EC 81 MG tablet Take 81 mg by mouth daily.    Historical Provider, MD  CASCARA SAGRADA PO Take 1 tablet by mouth at bedtime. Stool softner    Historical Provider, MD  levothyroxine (SYNTHROID, LEVOTHROID) 75 MCG tablet Take 75 mcg by mouth daily before breakfast.    Historical Provider, MD  lisinopril (PRINIVIL,ZESTRIL) 5 MG tablet Take 5 mg by mouth daily.    Historical Provider, MD  methocarbamol (ROBAXIN) 500 MG tablet Take 1 tablet (500 mg total) by mouth every 6 (six) hours as needed for muscle spasms. 10/17/14   [name], MD  metoprolol tartrate (LOPRESSOR) 25 MG tablet Take 25 mg by mouth 2 (two) times daily.     Historical Provider, MD  Omega-3 Fatty Acids (FISH OIL) 600 MG CAPS Take 1,800 mg by mouth daily.    Historical Provider, MD  oxyCODONE-acetaminophen (PERCOCET/ROXICET) 5-325 MG  tablet Take 1-2 tablets by mouth every 4 (four) hours as needed for moderate pain. 10/17/14   Barnett Abu, MD  pravastatin (PRAVACHOL) 40 MG tablet Take 40 mg by mouth at bedtime.    Historical Provider, MD   BP 158/111 mmHg  Pulse 106  Temp(Src) 98.4 F (36.9 C) (Oral)  Ht  (1.753 m)  Wt 210 lb (95.255 kg)  BMI 31.00 kg/m2  SpO2 98% Physical Exam  Constitutional: He is oriented to person, place, and time. He appears well-developed and well-nourished.  Pt looks uncomfortable, confused Wild gesticulations   HENT:  Head: Normocephalic and atraumatic.  Eyes: EOM are normal.  Neck: Normal  range of motion.  Cardiovascular: Normal rate, regular rhythm, normal heart sounds and intact distal pulses.   Pulmonary/Chest: Effort normal and breath sounds normal. No respiratory distress.  Abdominal: Soft. He exhibits no distension. There is no tenderness.  Musculoskeletal: Normal range of motion.  Neurological: He is alert and oriented to person, place, and time.  Skin: Skin is warm and dry.  Midline, lower back incision, appears to be healing without complication.   Psychiatric: He has a normal mood and affect. Judgment normal.  Nursing note and vitals reviewed.   ED Course  Procedures (including critical care time) DIAGNOSTIC STUDIES: Oxygen Saturation is 98% on RA, normal by my interpretation.    COORDINATION OF CARE: 11:02 AM- Pt advised of plan for treatment and pt agrees. Will receive medication for further evaluation.    Labs Review Labs Reviewed  BASIC METABOLIC PANEL - Abnormal; Notable for the following:    Sodium 134 (*)    CO2 20 (*)    Glucose, Bld 140 (*)    All other components within normal limits  CBC WITH DIFFERENTIAL/PLATELET - Abnormal; Notable for the following:    RBC 4.02 (*)    HCT 37.9 (*)    All other components within normal limits  URINALYSIS, ROUTINE W REFLEX MICROSCOPIC (NOT AT [facility]) - Abnormal; Notable for the following:    Ketones, ur TRACE (*)    All other components within normal limits  CBG MONITORING, ED - Abnormal; Notable for the following:    Glucose-Capillary 136 (*)    All other components within normal limits    Imaging Review No results found.   Ct Head Wo Contrast  11/15/2014  CLINICAL DATA:  Fall, hematoma EXAM: CT HEAD WITHOUT CONTRAST TECHNIQUE: Contiguous axial images were obtained from the base of the skull through the vertex without intravenous contrast. COMPARISON:  None. FINDINGS: No skull fracture is noted. Paranasal sinuses and mastoid air cells are unremarkable. Motion artifacts are noted. No intracranial  hemorrhage, mass effect or midline shift. Moderate cerebral atrophy. Extensive subcortical and periventricular chronic white matter disease. No acute cortical infarction. No mass lesion is noted on this unenhanced scan. IMPRESSION: No acute intracranial abnormality. Moderate cerebral atrophy. Extensive chronic white matter disease. No definite acute cortical infarction. There are motion artifacts. Electronically Signed   By: Natasha Mead M.D.   On: 11/15/2014 15:07   Ct Lumbar Spine Wo Contrast  11/07/2014  CLINICAL DATA:  Spinal stenosis. Decompression and fusion 4 weeks ago. Multiple prior surgeries otherwise. Low back pain with pain in the left groin and leg. EXAM: CT LUMBAR SPINE WITHOUT CONTRAST TECHNIQUE: Multidetector CT imaging of the lumbar spine was performed without intravenous contrast administration. Multiplanar CT image reconstructions were also generated. COMPARISON:  Radiography same day. Radiography 10/25/2014. Operative images 9 08/08/2014. Preoperative MRI 09/20/2014. FINDINGS: At the  recent fusion level of L1-2, there is subsidence of the interbody fusion material with some endplate depression, there is slight dorsal extrusion of the interbody fusion material without definite compressive encroachment upon the canal. There is developing lucency around the pedicle screws at L1 with the L1 screws angulated differently than on the operative images. All this is consistent with a " soft bone" situation. Elsewhere, there are no complicating or un suspected features. The T12-L1 level is normal. Previous fusion levels from the sacrum are unchanged in satisfactory. IMPRESSION: Interval discectomy and fusion at L1-2. Mild subsidence with mild endplate deformity inferiorly at L1 and superiorly at L2. Slight dorsal extrusion of the interbody fusion material without definite significant compromise upon the canal. Probable motion of the screws within the L1 bone. Electronically Signed   By: Paulina Fusi M.D.    On: 11/07/2014 16:58   Ronnie Razor, MD has personally reviewed and evaluated these images and lab results as part of his medical decision-making.   EKG Interpretation None      MDM   Final diagnoses:  Delirium    80yM with delirium. Suspect may be medication related. Pt is somewhat confused and mildly agitated but redirectable.  W/u fairly unremarkable. Afebrile. Surgical site appears to be healing w/o complication. Recommended admission for observation. Daughter is insistent on discharge though. I have some concern because daughter seems anxious to "get to Dr Verlee Rossetti office before they close" so they can pick up prescription for pain medication.  Advised to stop robaxin and really should limit narcotics particularly getting this far out from his surgery. She reports she has adequate help at home. Says she feels comfortable with taking him home. Apparently multiple family members who can assist as needed. Return precautions discussed. Needs close FU.     I personally preformed the services scribed in my presence. The recorded information has been reviewed is accurate. Ronnie Razor, MD.    Ronnie Razor, MD 11/25/14 2120

## 2014-11-15 NOTE — ED Notes (Addendum)
Entered room to d/c pt and daughter states that pt fell in the bathroom this morning and now she has noticed a hematoma to his forehead.  Bruising noted to forehead that was not previously noted.  MD made aware.

## 2015-02-22 ENCOUNTER — Other Ambulatory Visit: Payer: Self-pay | Admitting: Neurological Surgery

## 2015-02-26 ENCOUNTER — Encounter (HOSPITAL_COMMUNITY)
Admission: RE | Admit: 2015-02-26 | Discharge: 2015-02-26 | Disposition: A | Discharge status: Home / Self Care | Payer: Medicare Other | Source: Ambulatory Visit | Attending: Neurological Surgery | Admitting: Neurological Surgery

## 2015-02-26 ENCOUNTER — Encounter (HOSPITAL_COMMUNITY): Payer: Self-pay

## 2015-02-26 ENCOUNTER — Other Ambulatory Visit (HOSPITAL_COMMUNITY): Payer: Self-pay | Admitting: *Deleted

## 2015-02-26 DIAGNOSIS — Z01812 Encounter for preprocedural laboratory examination: Secondary | ICD-10-CM | POA: Insufficient documentation

## 2015-02-26 DIAGNOSIS — N189 Chronic kidney disease, unspecified: Secondary | ICD-10-CM | POA: Insufficient documentation

## 2015-02-26 DIAGNOSIS — Z7982 Long term (current) use of aspirin: Secondary | ICD-10-CM | POA: Diagnosis not present

## 2015-02-26 DIAGNOSIS — I129 Hypertensive chronic kidney disease with stage 1 through stage 4 chronic kidney disease, or unspecified chronic kidney disease: Secondary | ICD-10-CM | POA: Insufficient documentation

## 2015-02-26 DIAGNOSIS — I2510 Atherosclerotic heart disease of native coronary artery without angina pectoris: Secondary | ICD-10-CM | POA: Insufficient documentation

## 2015-02-26 DIAGNOSIS — E785 Hyperlipidemia, unspecified: Secondary | ICD-10-CM | POA: Diagnosis not present

## 2015-02-26 DIAGNOSIS — Z955 Presence of coronary angioplasty implant and graft: Secondary | ICD-10-CM | POA: Diagnosis not present

## 2015-02-26 DIAGNOSIS — I252 Old myocardial infarction: Secondary | ICD-10-CM | POA: Insufficient documentation

## 2015-02-26 DIAGNOSIS — Z01818 Encounter for other preprocedural examination: Secondary | ICD-10-CM | POA: Insufficient documentation

## 2015-02-26 DIAGNOSIS — Z79899 Other long term (current) drug therapy: Secondary | ICD-10-CM | POA: Insufficient documentation

## 2015-02-26 DIAGNOSIS — K219 Gastro-esophageal reflux disease without esophagitis: Secondary | ICD-10-CM | POA: Insufficient documentation

## 2015-02-26 HISTORY — DX: Gastro-esophageal reflux disease without esophagitis: K21.9

## 2015-02-26 HISTORY — DX: Chronic kidney disease, unspecified: N18.9

## 2015-02-26 HISTORY — DX: Constipation, unspecified: K59.00

## 2015-02-26 HISTORY — DX: Unspecified osteoarthritis, unspecified site: M19.90

## 2015-02-26 LAB — CBC
HEMATOCRIT: 41.9 % (ref 39.0–52.0)
HEMOGLOBIN: 13.2 g/dL (ref 13.0–17.0)
MCH: 29.9 pg (ref 26.0–34.0)
MCHC: 31.5 g/dL (ref 30.0–36.0)
MCV: 94.8 fL (ref 78.0–100.0)
Platelets: 271 10*3/uL (ref 150–400)
RBC: 4.42 MIL/uL (ref 4.22–5.81)
RDW: 12.8 % (ref 11.5–15.5)
WBC: 7.5 10*3/uL (ref 4.0–10.5)

## 2015-02-26 LAB — BASIC METABOLIC PANEL
ANION GAP: 11 (ref 5–15)
BUN: 20 mg/dL (ref 6–20)
CO2: 23 mmol/L (ref 22–32)
Calcium: 9.1 mg/dL (ref 8.9–10.3)
Chloride: 106 mmol/L (ref 101–111)
Creatinine, Ser: 1.26 mg/dL — ABNORMAL HIGH (ref 0.61–1.24)
GFR calc Af Amer: >60 mL/min (ref >60)
GFR, EST NON AFRICAN AMERICAN: 52 mL/min — AB (ref >60)
Glucose, Bld: 132 mg/dL — ABNORMAL HIGH (ref 65–99)
POTASSIUM: 3.8 mmol/L (ref 3.5–5.1)
SODIUM: 140 mmol/L (ref 135–145)

## 2015-02-26 LAB — SURGICAL PCR SCREEN
MRSA, PCR: NEGATIVE
Staphylococcus aureus: NEGATIVE

## 2015-02-26 NOTE — Progress Notes (Signed)
Pt has CAD with stents. Pt's cardiologist is Dr. Darl Householder in [place]. Has not seen him since 07/2013, states he's waiting on the office to let him know when he needs to come back. Note said f/u in 6 months. Pt denies any recent chest pain or sob.

## 2015-02-26 NOTE — Pre-Procedure Instructions (Addendum)
Ronnie Gregory  02/26/2015     Your procedure is scheduled on Tuesday, March 05, 2015 at 3:05 PM.   Report to [facility] Entrance "A" Admitting Office at 12:15 PM.   Call this number if you have problems the morning of surgery: [phone]   Any questions prior to day of surgery, please call [phone] between 8 & 4 PM.   Remember:  Do not eat food or drink liquids after midnight Monday, 03/04/15.  Take these medicines the morning of surgery with A SIP OF WATER: Levothyroxine (Synthroid), Metoprolol (Lopressor), Hydrocodone - if needed  Stop Aspirin as of today.   Do not wear jewelry.  Do not wear lotions, powders, or cologne.  You may wear deodorant.  Men may shave face and neck.  Do not bring valuables to the hospital.  [facility] is not responsible for any belongings or valuables.  Contacts, dentures or bridgework may not be worn into surgery.  Leave your suitcase in the car.  After surgery it may be brought to your room.  For patients admitted to the hospital, discharge time will be determined by your treatment team.  Special instructions:  [place] - Preparing for Surgery  Before surgery, you can play an important role.  Because skin is not sterile, your skin needs to be as free of germs as possible.  You can reduce the number of germs on you skin by washing with CHG (chlorahexidine gluconate) soap before surgery.  CHG is an antiseptic cleaner which kills germs and bonds with the skin to continue killing germs even after washing.  Please DO NOT use if you have an allergy to CHG or antibacterial soaps.  If your skin becomes reddened/irritated stop using the CHG and inform your nurse when you arrive at Short Stay.  Do not shave (including legs and underarms) for at least 48 hours prior to the first CHG shower.  You may shave your face.  Please follow these instructions carefully:   1.  Shower with CHG Soap the night before surgery and the                                 morning of Surgery.  2.  If you choose to wash your hair, wash your hair first as usual with your       normal shampoo.  3.  After you shampoo, rinse your hair and body thoroughly to remove the                      Shampoo.  4.  Use CHG as you would any other liquid soap.  You can apply chg directly       to the skin and wash gently with scrungie or a clean washcloth.  5.  Apply the CHG Soap to your body ONLY FROM THE NECK DOWN.        Do not use on open wounds or open sores.  Avoid contact with your eyes, ears, mouth and genitals (private parts).  Wash genitals (private parts) with your normal soap.  6.  Wash thoroughly, paying special attention to the area where your surgery        will be performed.  7.  Thoroughly rinse your body with warm water from the neck down.  8.  DO NOT shower/wash with your normal soap after using and rinsing off  the CHG Soap.  9.  Pat yourself dry with a clean towel.            10.  Wear clean pajamas.            11.  Place clean sheets on your bed the night of your first shower and do not        sleep with pets.  Day of Surgery  Do not apply any lotions the morning of surgery.  Please wear clean clothes to the hospital.   Please read over the following fact sheets that you were given. Pain Booklet, Coughing and Deep Breathing, MRSA Information and Surgical Site Infection Prevention

## 2015-02-27 NOTE — Progress Notes (Addendum)
Anesthesia Chart Review:  Pt is a 82 year old male scheduled for removal of L1 posterior hardware with Metrex on 03/05/2015 with Dr. Danielle Dess.   Cardiologist is Dr. Purvis Sheffield, last office visit 08/16/13.   PMH includes:  CAD (MI; s/p DES to RCA 04/2010), HTN, hyperlipidemia, hypothyroidism, CKD, GERD. Never smoker. BMI 28.5. S/p L1-2 PLIF 10/16/14. S/p L2-3 PLIF 12/06/12.   Medications include: ASA, levothyroxine, lisinopril, metoprolol, pravastatin.  Preoperative labs reviewed.    EKG 10/11/14: NSR.   Carotid duplex 04/06/11: No significant B ICA stenosis  Stress echo 08/01/10: Negative adequate stress echo. No apparent ischemia by EKG, echo or clinical criteria.   Cardiac cath 04/29/10:  1. CAD (LAD 30%, CX no obstructive disease, RCA serial mid and distal >90% stenosis) [By notes, RCA had collaterals] 2. Overall normal LV systolic function. Inferior hypokinesis. Elevated LVEDP.   Echo 04/27/10:  1. Overall LV systolic function normal to hyperdynamic.  2. LVH  Reviewed with Dr. Noreene Larsson. Cardiology f/u in 6 months was recommended by Dr. Purvis Sheffield in 2015, but this did not happen. Pt will need cardiology eval prior to surgery. Left voicemail for Shanda Bumps in Dr. Verlee Rossetti office.   Rica Mast, FNP-BC [facility] Short Stay Surgical Center/Anesthesiology Phone: [phone] 02/27/2015 4:08 PM  Addendum: Patient was seen for CAD follow-up and preoperative evaluation today by Debbora Dus, NP/Dr. Purvis Sheffield. She writes, "Overall, he is a low and acceptable cardiac risk to have L1 hardware removed. He can proceed with planned surgery without any cardiac testing.he is asymptomatic and normally very active with the exception of his lower back pain." EKG done and showed SR.  Velna Ochs [facility] Short Stay Center/Anesthesiology Phone [phone] 03/01/2015 7:21 PM

## 2015-03-01 ENCOUNTER — Ambulatory Visit (INDEPENDENT_AMBULATORY_CARE_PROVIDER_SITE_OTHER): Payer: Medicare Other | Admitting: Adult Health

## 2015-03-01 ENCOUNTER — Encounter: Payer: Self-pay | Admitting: Adult Health

## 2015-03-01 VITALS — BP 134/76 | HR 62 | Ht 70.0 in | Wt 196.0 lb

## 2015-03-01 DIAGNOSIS — I2510 Atherosclerotic heart disease of native coronary artery without angina pectoris: Secondary | ICD-10-CM | POA: Diagnosis not present

## 2015-03-01 DIAGNOSIS — Z01818 Encounter for other preprocedural examination: Secondary | ICD-10-CM | POA: Diagnosis not present

## 2015-03-01 NOTE — Progress Notes (Signed)
Name: Ronnie Gregory    DOB: Jan 27, 1933  Age: 82 y.o.  MR#: [id number]       PCP:  Ignatius Specking., MD      Insurance: Payor: [employer]UE [employer] MEDICARE / Plan: BCBS MEDICARE / Product Type: *No Product type* /   CC:   No chief complaint on file.   VS Filed Vitals:   03/01/15 1517  BP: 134/76  Pulse: 62  Height:  (1.778 m)  Weight: 196 lb (88.905 kg)  SpO2: 96%    Weights Current Weight  03/01/15 196 lb (88.905 kg)  02/26/15 198 lb 11.2 oz (90.13 kg)  11/15/14 210 lb (95.255 kg)    Blood Pressure  BP Readings from Last 3 Encounters:  03/01/15 134/76  02/26/15 106/55  11/15/14 157/94     Admit date:  (Not on file) Last encounter with RMR:  Visit date not found   Allergy Mobic  Current Outpatient Prescriptions  Medication Sig Dispense Refill  . aspirin EC 81 MG tablet Take 81 mg by mouth daily.    . CASCARA SAGRADA PO Take 1 tablet by mouth at bedtime. Stool softner    . HYDROcodone-acetaminophen (NORCO/VICODIN) 5-325 MG tablet Take 1-2 tablets by mouth every 6 (six) hours as needed for moderate pain.    Marland Kitchen levothyroxine (SYNTHROID, LEVOTHROID) 75 MCG tablet Take 75 mcg by mouth daily before breakfast.    . lisinopril (PRINIVIL,ZESTRIL) 5 MG tablet Take 5 mg by mouth daily.    . metoprolol tartrate (LOPRESSOR) 25 MG tablet Take 25 mg by mouth 2 (two) times daily.     . pravastatin (PRAVACHOL) 40 MG tablet Take 40 mg by mouth at bedtime.    Marland Kitchen tiZANidine (ZANAFLEX) 4 MG tablet Take 4 mg by mouth every 6 (six) hours as needed for muscle spasms.     No current facility-administered medications for this visit.    Discontinued Meds:    Medications Discontinued During This Encounter  Medication Reason  . oxyCODONE-acetaminophen (PERCOCET/ROXICET) 5-325 MG tablet Error    Patient Active Problem List   Diagnosis Date Noted  . Spinal stenosis, lumbar region, with neurogenic claudication 10/16/2014  . CAD (coronary artery disease) 12/02/2012  . S/P coronary  artery stent placement 12/02/2012    LABS    Component Value Date/Time   NA 140 02/26/2015 1438   NA 134* 11/15/2014 1115   NA 139 10/11/2014 1243   K 3.8 02/26/2015 1438   K 3.5 11/15/2014 1115   K 4.7 10/11/2014 1243   CL 106 02/26/2015 1438   CL 103 11/15/2014 1115   CL 106 10/11/2014 1243   CO2 23 02/26/2015 1438   CO2 20* 11/15/2014 1115   CO2 23 10/11/2014 1243   GLUCOSE 132* 02/26/2015 1438   GLUCOSE 140* 11/15/2014 1115   GLUCOSE 107* 10/11/2014 1243   BUN 20 02/26/2015 1438   BUN 20 11/15/2014 1115   BUN 19 10/11/2014 1243   CREATININE 1.26* 02/26/2015 1438   CREATININE 1.01 11/15/2014 1115   CREATININE 1.07 10/11/2014 1243   CALCIUM 9.1 02/26/2015 1438   CALCIUM 8.9 11/15/2014 1115   CALCIUM 9.9 10/11/2014 1243   GFRNONAA 52* 02/26/2015 1438   GFRNONAA >60 11/15/2014 1115   GFRNONAA >60 10/11/2014 1243   GFRAA >60 02/26/2015 1438   GFRAA >60 11/15/2014 1115   GFRAA >60 10/11/2014 1243   CMP     Component Value Date/Time   NA 140 02/26/2015 1438   K 3.8 02/26/2015 1438  CL 106 02/26/2015 1438   CO2 23 02/26/2015 1438   GLUCOSE 132* 02/26/2015 1438   BUN 20 02/26/2015 1438   CREATININE 1.26* 02/26/2015 1438   CALCIUM 9.1 02/26/2015 1438   GFRNONAA 52* 02/26/2015 1438   GFRAA >60 02/26/2015 1438       Component Value Date/Time   WBC 7.5 02/26/2015 1438   WBC 9.8 11/15/2014 1115   WBC 7.1 10/11/2014 1243   HGB 13.2 02/26/2015 1438   HGB 13.0 11/15/2014 1115   HGB 14.6 10/11/2014 1243   HCT 41.9 02/26/2015 1438   HCT 37.9* 11/15/2014 1115   HCT 43.9 10/11/2014 1243   MCV 94.8 02/26/2015 1438   MCV 94.3 11/15/2014 1115   MCV 98.0 10/11/2014 1243    Lipid Panel  No results found for: CHOL, TRIG, HDL, CHOLHDL, VLDL, LDLCALC, LDLDIRECT  ABG No results found for: PHART, PCO2ART, PO2ART, HCO3, TCO2, ACIDBASEDEF, O2SAT   No results found for: TSH BNP (last 3 results) No results for input(s): BNP in the last 8760 hours.  ProBNP (last 3  results) No results for input(s): PROBNP in the last 8760 hours.  Cardiac Panel (last 3 results) No results for input(s): CKTOTAL, CKMB, TROPONINI, RELINDX in the last 72 hours.  Iron/TIBC/Ferritin/ %Sat No results found for: IRON, TIBC, FERRITIN, IRONPCTSAT   EKG Orders placed or performed during the hospital encounter of 10/11/14  . EKG 12-Lead  . EKG 12-Lead     Prior Assessment and Plan Problem List as of 03/01/2015      Cardiovascular and Mediastinum   CAD (coronary artery disease)     Other   S/P coronary artery stent placement   Spinal stenosis, lumbar region, with neurogenic claudication       Imaging: No results found.

## 2015-03-01 NOTE — Progress Notes (Signed)
Cardiology Office Note   Date:  03/01/2015   ID:  Bryse, Blanchette December 16, 1933, MRN [id number]  PCP:  Ignatius Specking., MD  Cardiologist: Inis Sizer, NP   Chief Complaint  Patient presents with  . Coronary Artery Disease      History of Present Illness: Ronnie Gregory is a 82 y.o. male who presents for ongoing assessment and management of CAD, with stent placement in 2012, with a cardiac catheterization in April of 2004 revealing moderate size LAD and proximal calcification.  Mild diffuse nonobstructive disease of 30%.  The left circumflex artery was moderate in size with no obstructive stenosis.  The RCA was large, dominant vessel, with long subtotal 90% stenosis in the mid vessel stenosis, and focal, distal subtotal 90% stenosis.  There appeared to be competitive flow from the left sided collaterals.  Left ventricular systolic function was reportedly normal, with anterior hypokinesis.he was last seen in the office on 08/16/2013.at that time.  He was symptomatically stable, mildly hypertensive with increased dose of lisinopril to 5 mg daily.  He comes today in anticipation of having lumbar back surgery.  He apparently has a series of clamps, along his spine that has been causing a lot of pain, and they are going to be removed on Tuesday February 14.  He is here for preoperative evaluation.  He denies chest pain, shortness of breath, dizziness.  His only complaint is lower back where the L1 posterior hardware is causing issues.  Past Medical History  Diagnosis Date  . Hypertension   . Hypothyroidism   . Hypercholesteremia   . Myocardial infarction [facility]) 2009    Dr. Caryn Section cardiologist 2 stents  . Chronic kidney disease   . GERD (gastroesophageal reflux disease)   . Arthritis   . Constipation     uses stool softener daily    Past Surgical History  Procedure Laterality Date  . Cardiac catheterization    . Coronary angioplasty      3 years ago  .  Appendectomy    . Skin surgery      skin cancer removed from forehead  . Back surgery      several      Current Outpatient Prescriptions  Medication Sig Dispense Refill  . aspirin EC 81 MG tablet Take 81 mg by mouth daily.    . CASCARA SAGRADA PO Take 1 tablet by mouth at bedtime. Stool softner    . HYDROcodone-acetaminophen (NORCO/VICODIN) 5-325 MG tablet Take 1-2 tablets by mouth every 6 (six) hours as needed for moderate pain.    Marland Kitchen levothyroxine (SYNTHROID, LEVOTHROID) 75 MCG tablet Take 75 mcg by mouth daily before breakfast.    . lisinopril (PRINIVIL,ZESTRIL) 5 MG tablet Take 5 mg by mouth daily.    . metoprolol tartrate (LOPRESSOR) 25 MG tablet Take 25 mg by mouth 2 (two) times daily.     . pravastatin (PRAVACHOL) 40 MG tablet Take 40 mg by mouth at bedtime.    Marland Kitchen tiZANidine (ZANAFLEX) 4 MG tablet Take 4 mg by mouth every 6 (six) hours as needed for muscle spasms.     No current facility-administered medications for this visit.    Allergies:   Mobic    Social History:  The patient  reports that he quit smoking about 55 years ago. He started smoking about 60 years ago. He has never used smokeless tobacco. He reports that he does not drink alcohol or use illicit drugs.   Family History:  The patient's  family history includes Alzheimer's disease in his mother.    ROS: All other systems are reviewed and negative. Unless otherwise mentioned in H&P    PHYSICAL EXAM: VS:  BP 134/76 mmHg  Pulse 62  Ht  (1.778 m)  Wt 196 lb (88.905 kg)  BMI 28.12 kg/m2  SpO2 96% , BMI Body mass index is 28.12 kg/(m^2). GEN: Well nourished, well developed, in no acute distress HEENT: normal Neck: no JVD, carotid bruits, or masses Cardiac: RRR; no murmurs, rubs, or gallops,no edema  Respiratory:  clear to auscultation bilaterally, normal work of breathing GI: soft, nontender, nondistended, + BS MS: no deformity or atrophy Skin: warm and dry, no rash Neuro:  Strength and sensation are  intact Psych: euthymic mood, full affect   EKG:  he ekg ordered today demonstrates normal sinus rhythm, without evidence of ischemia or abnormalities.   Recent Labs: 02/26/2015: BUN 20; Creatinine, Ser 1.26*; Hemoglobin 13.2; Platelets 271; Potassium 3.8; Sodium 140    Lipid Panel No results found for: CHOL, TRIG, HDL, CHOLHDL, VLDL, LDLCALC, LDLDIRECT    Wt Readings from Last 3 Encounters:  03/01/15 196 lb (88.905 kg)  02/26/15 198 lb 11.2 oz (90.13 kg)  11/15/14 210 lb (95.255 kg)      ASSESSMENT AND PLAN:  1.  CAD: History of 30% LAD, no obstructive disease of the circumflex, RCA.  Serial, mid and distal 90% stenosis, with drug-eluting stent to the right coronary artery, and 4 2012.  Recommend continued medical therapy to include metoprolol 25 mg twice a day, lisinopril 5 mg daily, perioperatively.  He is holding his aspirin prior to surgery.  Overall, he is a low and acceptable cardiac risk to have L1 hardware removed. He can proceed with planned surgery without any cardiac testing.he is asymptomatic and normally very active with the exception of his lower back pain.  2. Hypercholesterolemia:.continue statin therapy.   Current medicines are reviewed at length with the patient today.    Labs/ tests ordered today include: EKG  Orders Placed This Encounter  Procedures  . EKG 12-Lead     Disposition:   FU with cardiology in 6 months unless he is symptomatic. Signed, Joni Reining, NP  03/01/2015 4:08 PM    [facility] [address] [place], [place] [address] Phone: [phone]; Fax: [phone]

## 2015-03-01 NOTE — Patient Instructions (Addendum)
Your physician wants you to follow-up in: 6 Months with Dr. Koneswaran. You will receive a reminder letter in the mail two months in advance. If you don't receive a letter, please call our office to schedule the follow-up appointment.  Your physician recommends that you continue on your current medications as directed. Please refer to the Current Medication list given to you today.  If you need a refill on your cardiac medications before your next appointment, please call your pharmacy.  Thank you for choosing [facility]!   

## 2015-03-05 ENCOUNTER — Inpatient Hospital Stay (HOSPITAL_COMMUNITY): Payer: Medicare Other | Admitting: Anesthesiology

## 2015-03-05 ENCOUNTER — Encounter (HOSPITAL_COMMUNITY)
Admission: RE | Disposition: A | Discharge status: Home / Self Care | Payer: Self-pay | Source: Ambulatory Visit | Attending: Neurological Surgery

## 2015-03-05 ENCOUNTER — Encounter (HOSPITAL_COMMUNITY): Payer: Self-pay | Admitting: *Deleted

## 2015-03-05 ENCOUNTER — Inpatient Hospital Stay (HOSPITAL_COMMUNITY)
Admission: RE | Admit: 2015-03-05 | Discharge: 2015-03-06 | DRG: 516 | Disposition: A | Discharge status: Home / Self Care | Payer: Medicare Other | Source: Ambulatory Visit | Attending: Neurological Surgery | Admitting: Neurological Surgery

## 2015-03-05 ENCOUNTER — Inpatient Hospital Stay (HOSPITAL_COMMUNITY): Payer: Medicare Other

## 2015-03-05 ENCOUNTER — Inpatient Hospital Stay (HOSPITAL_COMMUNITY): Payer: Medicare Other | Admitting: Emergency Medicine

## 2015-03-05 DIAGNOSIS — Z87891 Personal history of nicotine dependence: Secondary | ICD-10-CM | POA: Diagnosis not present

## 2015-03-05 DIAGNOSIS — K5900 Constipation, unspecified: Secondary | ICD-10-CM | POA: Diagnosis present

## 2015-03-05 DIAGNOSIS — Z955 Presence of coronary angioplasty implant and graft: Secondary | ICD-10-CM | POA: Diagnosis not present

## 2015-03-05 DIAGNOSIS — Y838 Other surgical procedures as the cause of abnormal reaction of the patient, or of later complication, without mention of misadventure at the time of the procedure: Secondary | ICD-10-CM | POA: Diagnosis present

## 2015-03-05 DIAGNOSIS — I2510 Atherosclerotic heart disease of native coronary artery without angina pectoris: Secondary | ICD-10-CM | POA: Diagnosis present

## 2015-03-05 DIAGNOSIS — M960 Pseudarthrosis after fusion or arthrodesis: Secondary | ICD-10-CM | POA: Diagnosis present

## 2015-03-05 DIAGNOSIS — T84226A Displacement of internal fixation device of vertebrae, initial encounter: Secondary | ICD-10-CM | POA: Diagnosis present

## 2015-03-05 DIAGNOSIS — Z85828 Personal history of other malignant neoplasm of skin: Secondary | ICD-10-CM

## 2015-03-05 DIAGNOSIS — E7800 Pure hypercholesterolemia, unspecified: Secondary | ICD-10-CM | POA: Diagnosis present

## 2015-03-05 DIAGNOSIS — I129 Hypertensive chronic kidney disease with stage 1 through stage 4 chronic kidney disease, or unspecified chronic kidney disease: Secondary | ICD-10-CM | POA: Diagnosis present

## 2015-03-05 DIAGNOSIS — Z888 Allergy status to other drugs, medicaments and biological substances status: Secondary | ICD-10-CM | POA: Diagnosis not present

## 2015-03-05 DIAGNOSIS — N182 Chronic kidney disease, stage 2 (mild): Secondary | ICD-10-CM | POA: Diagnosis present

## 2015-03-05 DIAGNOSIS — Y793 Surgical instruments, materials and orthopedic devices (including sutures) associated with adverse incidents: Secondary | ICD-10-CM | POA: Diagnosis present

## 2015-03-05 DIAGNOSIS — E039 Hypothyroidism, unspecified: Secondary | ICD-10-CM | POA: Diagnosis present

## 2015-03-05 DIAGNOSIS — I252 Old myocardial infarction: Secondary | ICD-10-CM

## 2015-03-05 DIAGNOSIS — Z419 Encounter for procedure for purposes other than remedying health state, unspecified: Secondary | ICD-10-CM

## 2015-03-05 DIAGNOSIS — S32009K Unspecified fracture of unspecified lumbar vertebra, subsequent encounter for fracture with nonunion: Secondary | ICD-10-CM | POA: Diagnosis present

## 2015-03-05 HISTORY — PX: LUMBAR LAMINECTOMY/ DECOMPRESSION WITH MET-RX: SHX5959

## 2015-03-05 SURGERY — LUMBAR LAMINECTOMY/ DECOMPRESSION WITH MET-RX
Anesthesia: General | Site: Back

## 2015-03-05 MED ORDER — ACETAMINOPHEN 325 MG PO TABS: 650.0000 mg | ORAL_TABLET | ORAL | Status: DC | PRN

## 2015-03-05 MED ORDER — LISINOPRIL 5 MG PO TABS
5.0000 mg | ORAL_TABLET | Freq: Every day | ORAL | Status: DC
  Administered 2015-03-05: 5 mg via ORAL
  Filled 2015-03-05 (×3): qty 1

## 2015-03-05 MED ORDER — CEFAZOLIN SODIUM-DEXTROSE 2-3 GM-% IV SOLR
2.0000 g | INTRAVENOUS | Status: AC
  Administered 2015-03-05: 2 g via INTRAVENOUS

## 2015-03-05 MED ORDER — ONDANSETRON HCL 4 MG/2ML IJ SOLN
INTRAMUSCULAR | Status: AC
  Filled 2015-03-05: qty 2

## 2015-03-05 MED ORDER — HYDROCODONE-ACETAMINOPHEN 5-325 MG PO TABS: 1.0000 | ORAL_TABLET | ORAL | Status: DC | PRN

## 2015-03-05 MED ORDER — CASCARA SAGRADA 1 GM/ML PO EXTR: Freq: Every day | ORAL | Status: DC

## 2015-03-05 MED ORDER — THROMBIN 5000 UNITS EX SOLR
CUTANEOUS | Status: DC | PRN
  Administered 2015-03-05 (×2): 5000 [IU] via TOPICAL

## 2015-03-05 MED ORDER — LEVOTHYROXINE SODIUM 75 MCG PO TABS
75.0000 ug | ORAL_TABLET | Freq: Every day | ORAL | Status: DC
  Administered 2015-03-06: 75 ug via ORAL
  Filled 2015-03-05 (×2): qty 1

## 2015-03-05 MED ORDER — BUPIVACAINE HCL (PF) 0.5 % IJ SOLN
INTRAMUSCULAR | Status: DC | PRN
Start: 2015-03-05 — End: 2015-03-05
  Administered 2015-03-05: 5 mL

## 2015-03-05 MED ORDER — SODIUM CHLORIDE 0.9 % IV SOLN
10.0000 mg | INTRAVENOUS | Status: DC | PRN
  Administered 2015-03-05: 10 ug/min via INTRAVENOUS

## 2015-03-05 MED ORDER — FENTANYL CITRATE (PF) 100 MCG/2ML IJ SOLN
INTRAMUSCULAR | Status: DC | PRN
  Administered 2015-03-05 (×5): 50 ug via INTRAVENOUS

## 2015-03-05 MED ORDER — ASPIRIN EC 81 MG PO TBEC
81.0000 mg | DELAYED_RELEASE_TABLET | Freq: Every day | ORAL | Status: DC
  Administered 2015-03-05: 81 mg via ORAL
  Filled 2015-03-05: qty 1

## 2015-03-05 MED ORDER — BISACODYL 10 MG RE SUPP: 10.0000 mg | Freq: Every day | RECTAL | Status: DC | PRN

## 2015-03-05 MED ORDER — SUGAMMADEX SODIUM 200 MG/2ML IV SOLN
INTRAVENOUS | Status: DC | PRN
  Administered 2015-03-05: 200 mg via INTRAVENOUS

## 2015-03-05 MED ORDER — MENTHOL 3 MG MT LOZG: 1.0000 | LOZENGE | OROMUCOSAL | Status: DC | PRN

## 2015-03-05 MED ORDER — ONDANSETRON HCL 4 MG/2ML IJ SOLN: 4.0000 mg | INTRAMUSCULAR | Status: DC | PRN

## 2015-03-05 MED ORDER — ROCURONIUM BROMIDE 100 MG/10ML IV SOLN
INTRAVENOUS | Status: DC | PRN
  Administered 2015-03-05: 40 mg via INTRAVENOUS

## 2015-03-05 MED ORDER — 0.9 % SODIUM CHLORIDE (POUR BTL) OPTIME
TOPICAL | Status: DC | PRN
  Administered 2015-03-05: 1000 mL

## 2015-03-05 MED ORDER — LACTATED RINGERS IV SOLN
INTRAVENOUS | Status: DC
  Administered 2015-03-05: 11:00:00 via INTRAVENOUS

## 2015-03-05 MED ORDER — ACETAMINOPHEN 650 MG RE SUPP
650.0000 mg | RECTAL | Status: DC | PRN
Start: 2015-03-05 — End: 2015-03-06

## 2015-03-05 MED ORDER — FENTANYL CITRATE (PF) 250 MCG/5ML IJ SOLN
INTRAMUSCULAR | Status: AC
  Filled 2015-03-05: qty 5

## 2015-03-05 MED ORDER — LIDOCAINE HCL (CARDIAC) 20 MG/ML IV SOLN
INTRAVENOUS | Status: DC | PRN
  Administered 2015-03-05: 50 mg via INTRAVENOUS

## 2015-03-05 MED ORDER — MIDAZOLAM HCL 2 MG/2ML IJ SOLN
INTRAMUSCULAR | Status: AC
  Filled 2015-03-05: qty 2

## 2015-03-05 MED ORDER — DOCUSATE SODIUM 100 MG PO CAPS
100.0000 mg | ORAL_CAPSULE | Freq: Two times a day (BID) | ORAL | Status: DC
  Administered 2015-03-05: 100 mg via ORAL
  Filled 2015-03-05: qty 1

## 2015-03-05 MED ORDER — ARTIFICIAL TEARS OP OINT
TOPICAL_OINTMENT | OPHTHALMIC | Status: DC | PRN
  Administered 2015-03-05: 1 via OPHTHALMIC

## 2015-03-05 MED ORDER — SODIUM CHLORIDE 0.9 % IV SOLN: 250.0000 mL | INTRAVENOUS | Status: DC

## 2015-03-05 MED ORDER — PRAVASTATIN SODIUM 40 MG PO TABS
40.0000 mg | ORAL_TABLET | Freq: Every day | ORAL | Status: DC
  Administered 2015-03-05: 40 mg via ORAL
  Filled 2015-03-05: qty 1

## 2015-03-05 MED ORDER — LACTATED RINGERS IV SOLN: INTRAVENOUS | Status: DC

## 2015-03-05 MED ORDER — SODIUM CHLORIDE 0.9% FLUSH
3.0000 mL | Freq: Two times a day (BID) | INTRAVENOUS | Status: DC
  Administered 2015-03-05: 3 mL via INTRAVENOUS

## 2015-03-05 MED ORDER — PROPOFOL 10 MG/ML IV BOLUS
INTRAVENOUS | Status: DC | PRN
  Administered 2015-03-05: 150 mg via INTRAVENOUS

## 2015-03-05 MED ORDER — HYDROMORPHONE HCL 1 MG/ML IJ SOLN: 0.5000 mg | INTRAMUSCULAR | Status: DC | PRN

## 2015-03-05 MED ORDER — METOPROLOL TARTRATE 25 MG PO TABS
25.0000 mg | ORAL_TABLET | Freq: Two times a day (BID) | ORAL | Status: DC
  Administered 2015-03-05: 25 mg via ORAL
  Filled 2015-03-05: qty 1

## 2015-03-05 MED ORDER — ONDANSETRON HCL 4 MG/2ML IJ SOLN
INTRAMUSCULAR | Status: DC | PRN
  Administered 2015-03-05: 4 mg via INTRAVENOUS

## 2015-03-05 MED ORDER — TIZANIDINE HCL 4 MG PO TABS
4.0000 mg | ORAL_TABLET | Freq: Four times a day (QID) | ORAL | Status: DC | PRN
Start: 2015-03-05 — End: 2015-03-06
  Filled 2015-03-05: qty 1

## 2015-03-05 MED ORDER — LIDOCAINE-EPINEPHRINE 1 %-1:100000 IJ SOLN
INTRAMUSCULAR | Status: DC | PRN
  Administered 2015-03-05: 5 mL

## 2015-03-05 MED ORDER — ALUM & MAG HYDROXIDE-SIMETH 200-200-20 MG/5ML PO SUSP: 30.0000 mL | Freq: Four times a day (QID) | ORAL | Status: DC | PRN

## 2015-03-05 MED ORDER — MIDAZOLAM HCL 5 MG/5ML IJ SOLN
INTRAMUSCULAR | Status: DC | PRN
  Administered 2015-03-05: 2 mg via INTRAVENOUS

## 2015-03-05 MED ORDER — CEFAZOLIN SODIUM-DEXTROSE 2-3 GM-% IV SOLR
INTRAVENOUS | Status: AC
  Filled 2015-03-05: qty 50

## 2015-03-05 MED ORDER — OXYCODONE-ACETAMINOPHEN 5-325 MG PO TABS: 1.0000 | ORAL_TABLET | ORAL | Status: DC | PRN

## 2015-03-05 MED ORDER — LACTATED RINGERS IV SOLN
INTRAVENOUS | Status: DC | PRN
  Administered 2015-03-05: 13:00:00 via INTRAVENOUS

## 2015-03-05 MED ORDER — SUGAMMADEX SODIUM 200 MG/2ML IV SOLN
INTRAVENOUS | Status: AC
  Filled 2015-03-05: qty 2

## 2015-03-05 MED ORDER — SODIUM CHLORIDE 0.9 % IR SOLN
Status: DC | PRN
  Administered 2015-03-05: 13:00:00

## 2015-03-05 MED ORDER — HYDROCODONE-ACETAMINOPHEN 5-325 MG PO TABS: 1.0000 | ORAL_TABLET | Freq: Four times a day (QID) | ORAL | Status: DC | PRN

## 2015-03-05 MED ORDER — PROPOFOL 10 MG/ML IV BOLUS
INTRAVENOUS | Status: AC
  Filled 2015-03-05: qty 20

## 2015-03-05 MED ORDER — PHENOL 1.4 % MT LIQD: 1.0000 | OROMUCOSAL | Status: DC | PRN

## 2015-03-05 MED ORDER — KETOROLAC TROMETHAMINE 15 MG/ML IJ SOLN
15.0000 mg | Freq: Four times a day (QID) | INTRAMUSCULAR | Status: DC
  Administered 2015-03-05 – 2015-03-06 (×3): 15 mg via INTRAVENOUS
  Filled 2015-03-05 (×3): qty 1

## 2015-03-05 MED ORDER — HYDROMORPHONE HCL 1 MG/ML IJ SOLN: 0.2500 mg | INTRAMUSCULAR | Status: DC | PRN

## 2015-03-05 MED ORDER — KETOROLAC TROMETHAMINE 15 MG/ML IJ SOLN
INTRAMUSCULAR | Status: AC
  Filled 2015-03-05: qty 1

## 2015-03-05 MED ORDER — SODIUM CHLORIDE 0.9% FLUSH: 3.0000 mL | INTRAVENOUS | Status: DC | PRN

## 2015-03-05 MED ORDER — SENNA 8.6 MG PO TABS
1.0000 | ORAL_TABLET | Freq: Two times a day (BID) | ORAL | Status: DC
  Administered 2015-03-05: 8.6 mg via ORAL
  Filled 2015-03-05: qty 1

## 2015-03-05 MED ORDER — POLYETHYLENE GLYCOL 3350 17 G PO PACK: 17.0000 g | PACK | Freq: Every day | ORAL | Status: DC | PRN

## 2015-03-05 MED ORDER — HEMOSTATIC AGENTS (NO CHARGE) OPTIME
TOPICAL | Status: DC | PRN
  Administered 2015-03-05: 1 via TOPICAL

## 2015-03-05 MED ORDER — PHENYLEPHRINE HCL 10 MG/ML IJ SOLN
INTRAMUSCULAR | Status: AC
  Filled 2015-03-05: qty 1

## 2015-03-05 SURGICAL SUPPLY — 52 items
ADH SKN CLS APL DERMABOND .7 (GAUZE/BANDAGES/DRESSINGS) ×1
BAG DECANTER FOR FLEXI CONT (MISCELLANEOUS) ×3 IMPLANT
BLADE CLIPPER SURG (BLADE) IMPLANT
BLADE SURG 15 STRL LF DISP TIS (BLADE) ×1 IMPLANT
BLADE SURG 15 STRL SS (BLADE) ×3
BUR 2.5 MTCH HD 16 (BUR) ×2 IMPLANT
BUR 2.5MM MTCH HD 16CM (BUR) ×1
CANISTER SUCT 3000ML PPV (MISCELLANEOUS) ×3 IMPLANT
DECANTER SPIKE VIAL GLASS SM (MISCELLANEOUS) ×3 IMPLANT
DERMABOND ADVANCED (GAUZE/BANDAGES/DRESSINGS) ×2
DERMABOND ADVANCED .7 DNX12 (GAUZE/BANDAGES/DRESSINGS) ×1 IMPLANT
DRAPE C-ARM 42X72 X-RAY (DRAPES) ×6 IMPLANT
DRAPE LAPAROTOMY 100X72 PEDS (DRAPES) ×3 IMPLANT
DRAPE MICROSCOPE LEICA (MISCELLANEOUS) ×3 IMPLANT
DRAPE POUCH INSTRU U-SHP 10X18 (DRAPES) ×3 IMPLANT
DURAPREP 6ML APPLICATOR 50/CS (WOUND CARE) ×3 IMPLANT
ELECT BLADE 6.5 EXT (BLADE) ×3 IMPLANT
ELECT REM PT RETURN 9FT ADLT (ELECTROSURGICAL) ×3
ELECTRODE REM PT RTRN 9FT ADLT (ELECTROSURGICAL) ×1 IMPLANT
GAUZE SPONGE 4X4 16PLY XRAY LF (GAUZE/BANDAGES/DRESSINGS) IMPLANT
GLOVE BIO SURGEON STRL SZ 6.5 (GLOVE) ×1 IMPLANT
GLOVE BIO SURGEONS STRL SZ 6.5 (GLOVE) ×1
GLOVE BIOGEL PI IND STRL 8.5 (GLOVE) ×1 IMPLANT
GLOVE BIOGEL PI INDICATOR 8.5 (GLOVE) ×2
GLOVE ECLIPSE 8.5 STRL (GLOVE) ×3 IMPLANT
GLOVE EXAM NITRILE LRG STRL (GLOVE) IMPLANT
GLOVE EXAM NITRILE MD LF STRL (GLOVE) IMPLANT
GLOVE EXAM NITRILE XL STR (GLOVE) IMPLANT
GLOVE EXAM NITRILE XS STR PU (GLOVE) IMPLANT
GLOVE INDICATOR 6.5 STRL GRN (GLOVE) ×6 IMPLANT
GOWN STRL REUS W/ TWL LRG LVL3 (GOWN DISPOSABLE) IMPLANT
GOWN STRL REUS W/ TWL XL LVL3 (GOWN DISPOSABLE) IMPLANT
GOWN STRL REUS W/TWL 2XL LVL3 (GOWN DISPOSABLE) ×3 IMPLANT
GOWN STRL REUS W/TWL LRG LVL3 (GOWN DISPOSABLE)
GOWN STRL REUS W/TWL XL LVL3 (GOWN DISPOSABLE)
KIT BASIN OR (CUSTOM PROCEDURE TRAY) ×3 IMPLANT
KIT ROOM TURNOVER OR (KITS) ×3 IMPLANT
NDL HYPO 18GX1.5 BLUNT FILL (NEEDLE) IMPLANT
NDL SPNL 20GX3.5 QUINCKE YW (NEEDLE) IMPLANT
NEEDLE HYPO 18GX1.5 BLUNT FILL (NEEDLE) IMPLANT
NEEDLE HYPO 22GX1.5 SAFETY (NEEDLE) ×3 IMPLANT
NEEDLE SPNL 20GX3.5 QUINCKE YW (NEEDLE) IMPLANT
NS IRRIG 1000ML POUR BTL (IV SOLUTION) ×3 IMPLANT
PACK LAMINECTOMY NEURO (CUSTOM PROCEDURE TRAY) ×3 IMPLANT
PAD ARMBOARD 7.5X6 YLW CONV (MISCELLANEOUS) ×9 IMPLANT
RUBBERBAND STERILE (MISCELLANEOUS) ×6 IMPLANT
SPONGE SURGIFOAM ABS GEL SZ50 (HEMOSTASIS) ×3 IMPLANT
SUT VIC AB 3-0 SH 8-18 (SUTURE) ×3 IMPLANT
SYR 5ML LL (SYRINGE) IMPLANT
TOWEL OR 17X24 6PK STRL BLUE (TOWEL DISPOSABLE) ×3 IMPLANT
TOWEL OR 17X26 10 PK STRL BLUE (TOWEL DISPOSABLE) ×3 IMPLANT
WATER STERILE IRR 1000ML POUR (IV SOLUTION) ×3 IMPLANT

## 2015-03-05 NOTE — Anesthesia Procedure Notes (Signed)
Procedure Name: Intubation Date/Time: 03/05/2015 1:11 PM Performed by: Wray Kearns A Pre-anesthesia Checklist: Patient identified, Timeout performed, Emergency Drugs available, Suction available and Patient being monitored Patient Re-evaluated:Patient Re-evaluated prior to inductionOxygen Delivery Method: Circle system utilized Preoxygenation: Pre-oxygenation with 100% oxygen Intubation Type: IV induction and Cricoid Pressure applied Ventilation: Mask ventilation without difficulty and Oral airway inserted - appropriate to patient size Laryngoscope Size: Mac and 4 Grade View: Grade II Tube type: Oral Tube size: 7.5 mm Number of attempts: 1 Airway Equipment and Method: Stylet Placement Confirmation: ETT inserted through vocal cords under direct vision,  breath sounds checked- equal and bilateral and positive ETCO2 Secured at: 23 cm Tube secured with: Tape Dental Injury: Teeth and Oropharynx as per pre-operative assessment

## 2015-03-05 NOTE — Transfer of Care (Signed)
Immediate Anesthesia Transfer of Care Note  Patient: Ronnie Gregory  Procedure(s) Performed: Procedure(s): Removal of posterior hardware Lumbar one with Metrex (N/A)  Patient Location: PACU  Anesthesia Type:General  Level of Consciousness: awake, oriented, sedated, patient cooperative and responds to stimulation  Airway & Oxygen Therapy: Patient Spontanous Breathing and Patient connected to nasal cannula oxygen  Post-op Assessment: Report given to RN, Post -op Vital signs reviewed and stable, Patient moving all extremities and Patient moving all extremities X 4  Post vital signs: Reviewed and stable  Last Vitals:  Filed Vitals:   03/05/15 1012  BP: 148/70  Pulse: 65  Temp: 36.2 C  Resp: 20    Complications: No apparent anesthesia complications

## 2015-03-05 NOTE — Anesthesia Postprocedure Evaluation (Signed)
Anesthesia Post Note  Patient: Ronnie Gregory  Procedure(s) Performed: Procedure(s) (LRB): Removal of posterior hardware Lumbar one with Metrex (N/A)  Patient location during evaluation: PACU Anesthesia Type: General Level of consciousness: awake and alert Pain management: pain level controlled Vital Signs Assessment: post-procedure vital signs reviewed and stable Respiratory status: spontaneous breathing, nonlabored ventilation, respiratory function stable and patient connected to nasal cannula oxygen Cardiovascular status: blood pressure returned to baseline and stable Postop Assessment: no signs of nausea or vomiting Anesthetic complications: no    Last Vitals:  Filed Vitals:   03/05/15 1012 03/05/15 1425  BP: 148/70 154/77  Pulse: 65 80  Temp: 36.2 C 36.8 C  Resp: 20 12    Last Pain:  Filed Vitals:   03/05/15 1435  PainSc: 10-Worst pain ever                 Shervin Cypert L

## 2015-03-05 NOTE — H&P (Signed)
Ronnie Gregory is an 82 y.o. male.   Chief Complaint: Back pain status post fusion September 2016 at L1-L2 HPI: Ronnie Gregory is a 82 year old individual who's had a significant history of lumbar spondylosis and instability with decompressions and fusions from L2 to the sacrum in September he underwent decompression fusion at L1-L2 as he developed a significant retrolisthesis at that level. Initially felt well however recently started developing increasing pain and it was noted that he had progressive loosening of the screws at L1 with loss of height in the graft site and some subsidence. He now is having significant centralized back pain has evidence of mild pseudoarthrosis albeit he has not lost alignment in the coronal or sagittal planes other than developing the subsidence. He is now been advised regarding removal of the hardware and the hope that this will ultimately lead to some resolution of his pain and allow this process to fuse.  Past Medical History  Diagnosis Date  . Hypertension   . Hypothyroidism   . Hypercholesteremia   . Myocardial infarction [facility]) 2009    Dr. Caryn Section cardiologist 2 stents  . Chronic kidney disease   . GERD (gastroesophageal reflux disease)   . Arthritis   . Constipation     uses stool softener daily    Past Surgical History  Procedure Laterality Date  . Cardiac catheterization    . Coronary angioplasty      3 years ago  . Appendectomy    . Skin surgery      skin cancer removed from forehead  . Back surgery      several     Family History  Problem Relation Age of Onset  . Alzheimer's disease Mother    Social History:  reports that he quit smoking about 55 years ago. He started smoking about 60 years ago. He has never used smokeless tobacco. He reports that he does not drink alcohol or use illicit drugs.  Allergies:  Allergies  Allergen Reactions  . Mobic [Meloxicam] Other (See Comments)    Mucus    No prescriptions prior  to admission    No results found for this or any previous visit (from the past 48 hour(s)). No results found.  Review of Systems  Constitutional: Negative.   HENT: Negative.   Eyes: Negative.   Respiratory: Negative.   Cardiovascular: Negative.   Gastrointestinal: Negative.   Genitourinary: Negative.   Musculoskeletal: Positive for back pain.  Skin: Negative.   Neurological: Positive for tingling and sensory change.       Centralized back pain and weakness  Endo/Heme/Allergies: Negative.   Psychiatric/Behavioral: Negative.     There were no vitals taken for this visit. Physical Exam  Constitutional: He is oriented to person, place, and time. He appears well-developed and well-nourished.  HENT:  Head: Normocephalic and atraumatic.  Eyes: Conjunctivae and EOM are normal.  Neck: Neck supple.  Cardiovascular: Normal rate and regular rhythm.   Respiratory: Effort normal and breath sounds normal.  GI: Bowel sounds are normal.  Musculoskeletal:  Centralized back pain palpation and percussion. Station reveals a 10 forward stoop. Patient notes pain on attempting to extend.  Neurological: He is alert and oriented to person, place, and time.  Mild weakness in tibialis anterior bilaterally absent reflexes in patella and Achilles. Positive straight leg raising at 30 in either lower extremity.  Skin: Skin is warm and dry.     Assessment/Plan Pseudoarthrosis L1-L2 with loose hardware. 5 months status post arthrodesis L1-L2.  Removal of hardware L1-L2.  Stefani Dama, MD 03/05/2015, 7:40 AM

## 2015-03-05 NOTE — Progress Notes (Signed)
Patient ID: Ronnie Gregory, male   DOB: [birth year], 82 y.o.   MRN: [id number] Vital signs are stable, motor function is intact in upper extremities, back pain is moderate. Incision is clean and dry.

## 2015-03-05 NOTE — Anesthesia Preprocedure Evaluation (Signed)
Anesthesia Evaluation  Patient identified by MRN, date of birth, ID band Patient awake    Reviewed: Allergy & Precautions, H&P , NPO status , Patient's Chart, lab work & pertinent test results, reviewed documented beta blocker date and time   Airway Mallampati: II  TM Distance: >3 FB Neck ROM: Full    Dental  (+) Chipped, Missing, Dental Advisory Given,    Pulmonary neg pulmonary ROS, former smoker,    Pulmonary exam normal breath sounds clear to auscultation       Cardiovascular hypertension, Pt. on medications and Pt. on home beta blockers + CAD, + Past MI and + Cardiac Stents  Normal cardiovascular exam Rhythm:Regular Rate:Normal     Neuro/Psych negative neurological ROS  negative psych ROS   GI/Hepatic negative GI ROS, Neg liver ROS,   Endo/Other  negative endocrine ROSHypothyroidism   Renal/GU Renal diseaseStage 2 chronic kidney disease  negative genitourinary   Musculoskeletal   Abdominal   Peds  Hematology negative hematology ROS (+)   Anesthesia Other Findings   Reproductive/Obstetrics negative OB ROS                             Anesthesia Physical Anesthesia Plan  ASA: III  Anesthesia Plan: General   Post-op Pain Management:    Induction: Intravenous  Airway Management Planned: Oral ETT  Additional Equipment:   Intra-op Plan:   Post-operative Plan: Extubation in OR  Informed Consent: I have reviewed the patients History and Physical, chart, labs and discussed the procedure including the risks, benefits and alternatives for the proposed anesthesia with the patient or authorized representative who has indicated his/her understanding and acceptance.   Dental Advisory Given  Plan Discussed with: CRNA  Anesthesia Plan Comments:         Anesthesia Quick Evaluation

## 2015-03-05 NOTE — Op Note (Signed)
Date of surgery: 03/05/2015 Preoperative diagnosis: Pseudoarthrosis L1-L2 with loosening of hardware L1-L2, status post arthrodesis September 2016 Postoperative diagnosis: Pseudoarthrosis L1-L2 with loosening of hardware L1-L2 status post arthrodesis September 2016 Procedure: Removal of hardware L1-L2 using Metrix tube retractor and fluoroscopy Surgeon: Barnett Abu Anesthesia: Gen. endotracheal Indications: Ronnie Gregory is a 82 year old individual is had significant back pain status post arthrodesis done September 2016. His found to have a pseudoarthrosis at L1-L2 and loosening of the hardware particularly in the L1 pedicles. It is felt that he is now achieved an adequate amount of stability in the fusion mass that he can undergo hardware removal.  Procedure: The patient was brought to the operating room supine on a stretcher. After the smooth induction of general endotracheal anesthesia, he was turned prone. The bony prominences were appropriately padded and protected. The back was prepped with alcohol and DuraPrep and draped in a sterile fashion. Fluoroscopic guidance was used to localize an area of common entry on the right side in an area of common entry on left side to identify the screws at L1 and L2. Skin above this area was infiltrated with total of 5 mL of lidocaine with epinephrine 1-100,000 mix half-and-half with half percent Marcaine. A small vertical incision was created. A K wire was then passed by palpation to the screw head of L1. A series of dilators was passed over this and ultimately an 18 mm diameter by 5 cm deep cannula was placed over the screw at L1. The cautery was used to open and expose the locking caps. A locking Driver's then used to remove the locking. Same procedure was then carried out at the L2 screw through the K wire passed through the same incision in different direction. The locking There was removed. Then a portion of the rod was uncovered with the monopolar  cautery. This could then be grabbed with a rod holder. The rod was lifted out of the saddles. It was removed from the incision. The screws were then removed individually using the screwdriver deep in the saddles. Attention was then turned to the opposite side where similar procedure was carried out. At the end fluoroscopic verification of removal of all the hardware was obtained. The wounds were irrigated with antibiotic irrigating solution, and then the fascia was closed with 3-0 Vicryl interrupted fashion, 3-0 Vicryl was used to close the subcutaneous take her skin. Blood loss for the procedure was nil. Patient tolerated procedure well.

## 2015-03-06 ENCOUNTER — Encounter (HOSPITAL_COMMUNITY): Payer: Self-pay | Admitting: Neurological Surgery

## 2015-03-06 MED ORDER — HYDROCODONE-ACETAMINOPHEN 5-325 MG PO TABS: 1.0000 | ORAL_TABLET | ORAL | Status: DC | PRN

## 2015-03-06 NOTE — Evaluation (Addendum)
Occupational Therapy Evaluation AND Discharge  Patient Details Name: Ronnie Gregory MRN: [id number] DOB: 1933/12/10 Today's Date: 03/06/2015    History of Present Illness 82 yo male admitted to undergo surgical removal of his hardware from L1-L2. PMH: MI and HTN   Clinical Impression   Patient admitted with above. Patient independent to mod I PTA. Patient currently functioning at an overall independent to mod I level .  No additional OT needs identified, D/C from acute OT services and no additional follow-up OT needs at this time. All appropriate education provided to patient. Please re-order OT if needed.      Follow Up Recommendations  No OT follow up;Supervision - Intermittent    Equipment Recommendations  Other (comment) (LH sponge)    Recommendations for Other Services  None at this time    Precautions / Restrictions Precautions Precautions: Back Precaution Comments: Pt independently able to verbalize and adhere to back precautions. Pt states daughter had brace and will bring it when she comes to hospital, surgeon OK with pt working with therapy without brace - verbal orders.  Required Braces or Orthoses: Spinal Brace Spinal Brace: Applied in sitting position;Applied in standing position Restrictions Weight Bearing Restrictions: No    Mobility Bed Mobility Overal bed mobility: Independent  Transfers Overall transfer level: Independent Equipment used: None    Balance Overall balance assessment: No apparent balance deficits (not formally assessed)    ADL Overall ADL's : Modified independent   General ADL Comments: mod I for increased time and use of DME/AE prn. Pt with great carryover from last surgery.     Pertinent Vitals/Pain Pain Assessment: No/denies pain Pain Score: 0-No pain Faces Pain Scale: No hurt     Hand Dominance Left   Extremity/Trunk Assessment Upper Extremity Assessment Upper Extremity Assessment: Overall WFL for tasks assessed   Lower  Extremity Assessment Lower Extremity Assessment: Defer to PT evaluation   Cervical / Trunk Assessment Cervical / Trunk Assessment: Normal   Communication Communication Communication: No difficulties   Cognition Arousal/Alertness: Awake/alert Behavior During Therapy: WFL for tasks assessed/performed Overall Cognitive Status: Within Functional Limits for tasks assessed [facility])              Home Living Family/patient expects to be discharged to:: Private residence Living Arrangements: Alone Available Help at Discharge: Family;Friend(s);Available PRN/intermittently Type of Home: House Home Access: Level entry;Stairs to enter (small threshold)     Home Layout: Able to live on main level with bedroom/bathroom (basement)     Bathroom Shower/Tub: Tub/shower unit   Bathroom Toilet: Handicapped height     Home Equipment: Environmental consultant - 2 wheels;Cane - single point;Bedside commode;Wheelchair - manual;Tub bench;Hand held shower head;Adaptive equipment Adaptive Equipment: Reacher;Sock aid        Prior Functioning/Environment Level of Independence: Independent     OT Diagnosis: Generalized weakness   OT Problem List:   N/a, no acute OT needs identified at this time     OT Treatment/Interventions:   N/a, no acute OT needs identified at this time     OT Goals(Current goals can be found in the care plan section) Acute Rehab OT Goals Patient Stated Goal: go home today OT Goal Formulation: All assessment and education complete, DC therapy  OT Frequency:   N/a, no acute OT needs identified at this time     Barriers to D/C:  None known at this time    End of Session Equipment Utilized During Treatment:  (back brace not in room, surgeon OK with pt working  with therapies without brace) Nurse Communication: Mobility status  Activity Tolerance: Patient tolerated treatment well Patient left: in chair;with call bell/phone within reach   Time: 0932-0949 OT Time Calculation (min): 17  min Charges:  OT General Charges $OT Visit: 1 Procedure OT Evaluation $OT Eval Low Complexity: 1 Procedure  Edwin Cap , MS, OTR/L, CLT Pager: [phone]  03/06/2015, 9:57 AM

## 2015-03-06 NOTE — Discharge Instructions (Signed)

## 2015-03-06 NOTE — Progress Notes (Signed)
Pt doing well. Pt and daughter given D/C instructions with Rx, verbal understanding was provided. Pt's incision is clean and dry with no sign of infection. Pt's IV was removed prior to D/C. Pt and daughter refused Home Health. Pt D/C'd home via walking @ 1125 per MD order. Pt is stable @ D/C and has no other needs at this time. Rema Fendt, RN

## 2015-03-06 NOTE — Discharge Summary (Signed)
Physician Discharge Summary  Patient ID: Ronnie Gregory MRN: [id number] DOB/AGE: 1933/06/01 82 y.o.  Admit date: 03/05/2015 Discharge date: 03/06/2015  Admission Diagnoses: Pseudoarthrosis L1-L2 with loosening of hardware  Discharge Diagnoses: Pseudoarthrosis L1-L2 with loosening of hardware Active Problems:   Pseudoarthrosis of lumbar spine   Discharged Condition: good  Hospital Course: Patient was admitted to undergo surgical removal of his hardware from L1-L2. He tolerated this well. He feels some initial relief of pain.  Consults: None  Significant Diagnostic Studies: None  Treatments: surgery: Removal of L1-L2 fixation using Metrix tube retractors and fluoroscopy  Discharge Exam: Blood pressure 147/67, pulse 67, temperature 98 F (36.7 C), temperature source Oral, resp. rate 20, height  (1.778 m), weight 88.905 kg (196 lb), SpO2 96 %. Incisions are clean and dry and motor function is intact. Station and gait are intact.  Disposition: 01-Home or Self Care  Discharge Instructions    Call MD for:  redness, tenderness, or signs of infection (pain, swelling, redness, odor or green/yellow discharge around incision site)    Complete by:  As directed      Call MD for:  severe uncontrolled pain    Complete by:  As directed      Call MD for:  temperature >100.4    Complete by:  As directed      Diet - low sodium heart healthy    Complete by:  As directed      Discharge instructions    Complete by:  As directed   Okay to shower. Do not apply salves or appointments to incision. No heavy lifting with the upper extremities greater than 15 pounds. May resume driving when not requiring pain medication and patient feels comfortable with doing so.     Increase activity slowly    Complete by:  As directed             Medication List    TAKE these medications        aspirin EC 81 MG tablet  Take 81 mg by mouth daily.     CASCARA SAGRADA PO  Take 1 tablet by mouth at  bedtime. Stool softner     HYDROcodone-acetaminophen 5-325 MG tablet  Commonly known as:  NORCO/VICODIN  Take 1-2 tablets by mouth every 6 (six) hours as needed for moderate pain.     HYDROcodone-acetaminophen 5-325 MG tablet  Commonly known as:  NORCO/VICODIN  Take 1-2 tablets by mouth every 4 (four) hours as needed (mild pain).     levothyroxine 75 MCG tablet  Commonly known as:  SYNTHROID, LEVOTHROID  Take 75 mcg by mouth daily before breakfast.     lisinopril 5 MG tablet  Commonly known as:  PRINIVIL,ZESTRIL  Take 5 mg by mouth daily.     metoprolol tartrate 25 MG tablet  Commonly known as:  LOPRESSOR  Take 25 mg by mouth 2 (two) times daily.     pravastatin 40 MG tablet  Commonly known as:  PRAVACHOL  Take 40 mg by mouth at bedtime.     tiZANidine 4 MG tablet  Commonly known as:  ZANAFLEX  Take 4 mg by mouth every 6 (six) hours as needed for muscle spasms.         SignedStefani Dama 03/06/2015, 9:10 AM

## 2015-03-06 NOTE — Evaluation (Signed)
Physical Therapy Evaluation Patient Details Name: Ronnie Gregory MRN: [id number] DOB: [birth year] Today's Date: 03/06/2015   History of Present Illness  82 yo male s/p surgical removal of his hardware from L1-L2. PMH: MI and HTN  Clinical Impression  Patient is s/p above surgery resulting in functional limitations due to the deficits listed below (see PT Problem List). Pt Ind PTA.  He will have 24/7 assist available from his daughter at d/c.  He presents w/ min balance impairments due to antalgic gait pattern and flexed and kyphotic posture.  Will benefit from HHPT to address these impairments. Patient will benefit from skilled PT to increase their independence and safety with mobility to allow discharge to the venue listed below.      Follow Up Recommendations Home health PT;Supervision for mobility/OOB    Equipment Recommendations  None recommended by PT    Recommendations for Other Services       Precautions / Restrictions Precautions Precautions: Back Precaution Booklet Issued: No (already in room) Precaution Comments: Does not need brace, per MD.  Daughter has brace and will bring it when she comes to the hospital.  Pt unable to recall no arching precaution. Restrictions Weight Bearing Restrictions: No      Mobility  Bed Mobility               General bed mobility comments: Pt sitting in recliner chair upon PT arrival  Transfers Overall transfer level: Needs assistance Equipment used: None Transfers: Sit to/from Stand Sit to Stand: Supervision         General transfer comment: Supervision for safety  Ambulation/Gait Ambulation/Gait assistance: Min guard Ambulation Distance (Feet): 300 Feet Assistive device: None Gait Pattern/deviations: Step-through pattern;Decreased stride length;Shuffle;Trunk flexed   Gait velocity interpretation: at or above normal speed for age/gender General Gait Details: Pt barely clear floor during swing phase but does not  require physical assist.  Cues for upright posture as pt kyphotic w/ flexed posture.  Cues for hip and knee flexion during swing phase w/ poor carryover.  Stairs            Wheelchair Mobility    Modified Rankin (Stroke Patients Only)       Balance Overall balance assessment: Needs assistance Sitting-balance support: No upper extremity supported;Feet supported Sitting balance-Leahy Scale: Good     Standing balance support: No upper extremity supported;During functional activity Standing balance-Leahy Scale: Fair                               Pertinent Vitals/Pain Pain Assessment: No/denies pain Pain Score: 0-No pain Faces Pain Scale: No hurt    Home Living Family/patient expects to be discharged to:: Private residence Living Arrangements: Alone Available Help at Discharge: Family;Available 24 hours/day Type of Home: House Home Access: Level entry     Home Layout: Able to live on main level with bedroom/bathroom;Two level (basement) Home Equipment: Walker - 2 wheels;Cane - single point;Bedside commode;Wheelchair - manual;Tub bench;Hand held shower head;Adaptive equipment Additional Comments: Daughter is planning to stay w/ pt 24/7 at d/c for as long as he needs    Prior Function Level of Independence: Independent         Comments: still driving     Hand Dominance   Dominant Hand: Left    Extremity/Trunk Assessment   Upper Extremity Assessment: Defer to OT evaluation           Lower Extremity Assessment: Overall WFL for  tasks assessed      Cervical / Trunk Assessment: Kyphotic  Communication   Communication: No difficulties  Cognition Arousal/Alertness: Awake/alert Behavior During Therapy: WFL for tasks assessed/performed Overall Cognitive Status: Within Functional Limits for tasks assessed                      General Comments      Exercises General Exercises - Lower Extremity Ankle Circles/Pumps: AROM;Both;10  reps;Seated Long Arc Quad: AROM;Both;10 reps;Seated      Assessment/Plan    PT Assessment Patient needs continued PT services  PT Diagnosis Difficulty walking;Acute pain   PT Problem List Decreased balance;Decreased safety awareness;Pain  PT Treatment Interventions DME instruction;Gait training;Functional mobility training;Therapeutic activities;Therapeutic exercise;Balance training;Neuromuscular re-education;Patient/family education;Modalities   PT Goals (Current goals can be found in the Care Plan section) Acute Rehab PT Goals Patient Stated Goal: go home today PT Goal Formulation: With patient Time For Goal Achievement: 03/13/15 Potential to Achieve Goals: Good    Frequency Min 4X/week   Barriers to discharge        Co-evaluation               End of Session Equipment Utilized During Treatment: Gait belt Activity Tolerance: Patient tolerated treatment well Patient left: in chair;with call bell/phone within reach Nurse Communication: Mobility status;Other (comment);Precautions (recommending HHPT at d/c for balance impairments)         Time: 1009-1020 PT Time Calculation (min) (ACUTE ONLY): 11 min   Charges:   PT Evaluation $PT Eval Low Complexity: 1 Procedure     PT G Codes:       Michail Jewels PT, DPT [phone] Pager: [phone] 03/06/2015, 2:16 PM

## 2015-03-06 NOTE — Progress Notes (Signed)
Patient ID: Ronnie Gregory, male   DOB: November 12, 1933, 82 y.o.   MRN: [id number] Vital signs are stable Motor function is intact Nose comfortable Ready to go home

## 2015-08-29 ENCOUNTER — Ambulatory Visit: Payer: Medicare Other | Admitting: Cardiovascular Disease

## 2015-08-30 ENCOUNTER — Ambulatory Visit: Payer: Medicare Other | Admitting: Cardiovascular Disease

## 2015-08-30 DIAGNOSIS — R0989 Other specified symptoms and signs involving the circulatory and respiratory systems: Secondary | ICD-10-CM

## 2015-09-04 ENCOUNTER — Ambulatory Visit: Payer: Medicare Other | Admitting: Cardiovascular Disease

## 2015-09-18 ENCOUNTER — Encounter: Payer: Self-pay | Admitting: Physician Assistant

## 2015-09-18 ENCOUNTER — Ambulatory Visit (INDEPENDENT_AMBULATORY_CARE_PROVIDER_SITE_OTHER): Payer: Medicare Other | Admitting: Physician Assistant

## 2015-09-18 VITALS — BP 153/79 | HR 59 | Ht 70.0 in | Wt 207.0 lb

## 2015-09-18 DIAGNOSIS — E785 Hyperlipidemia, unspecified: Secondary | ICD-10-CM

## 2015-09-18 DIAGNOSIS — I10 Essential (primary) hypertension: Secondary | ICD-10-CM | POA: Diagnosis not present

## 2015-09-18 DIAGNOSIS — R0989 Other specified symptoms and signs involving the circulatory and respiratory systems: Secondary | ICD-10-CM

## 2015-09-18 DIAGNOSIS — I2510 Atherosclerotic heart disease of native coronary artery without angina pectoris: Secondary | ICD-10-CM

## 2015-09-18 NOTE — Patient Instructions (Signed)
Your physician wants you to follow-up in: 1 Year with Dr. Purvis SheffieldKoneswaran. You will receive a reminder letter in the mail two months in advance. If you don't receive a letter, please call our office to schedule the follow-up appointment.  Your physician recommends that you continue on your current medications as directed. Please refer to the Current Medication list given to you today.  Your physician has requested that you have a carotid duplex. This test is an ultrasound of the carotid arteries in your neck. It looks at blood flow through these arteries that supply the brain with blood. Allow one hour for this exam. There are no restrictions or special instructions.  If you need a refill on your cardiac medications before your next appointment, please call your pharmacy.  Thank you for choosing [facility]!

## 2015-09-18 NOTE — Progress Notes (Signed)
Cardiology Office Note    Date:  09/18/2015   ID:  Ronnie, Gregory 1933-08-20, MRN [id number]  PCP:  Glenda Chroman, MD  Cardiologist: Dr. Bronson Ing  Chief Complaint  Patient presents with  . Follow-up    History of Present Illness:  Ronnie Gregory is a 82 y.o. male  with a history of coronary artery disease who reportedly had 2 stents to the RCA 2012. He underwent a negative stress echocardiogram and walked for 6 minutes on the Bruce protocol achieving 7 METS on 08/02/2010.  Cardiac catheterization report from 04/29/2010 Lev[facility] Fresh revealed a moderate sized LAD with proximal calcification and mild diffuse nonobstructive disease up to 30%. The left circumflex coronary artery was moderate in size with no obstructive stenosis. The RCA was a large dominant vessel with a long subtotal 90% stenosis in the mid vessel stenosis and a focal distal subtotal 90% stenosis. There appeared to be competitive flow from left-sided collaterals. Left ventricular systolic function was reportedly normal with inferior hypokinesis.  An echocardiogram performed on 04/28/2010 showed left into a systolic function to be normal to hyperdynamic with evidence of left ventricular hypertrophy and mild mitral and tricuspid regurgitation with no regional wall motion abnormalities. Patient also has hypertension, hyperlipidemia.  Patient comes in today for routine checkup. He denies chest pain, palpitations, dyspnea, dyspnea on exertion, dizziness or presyncope. He recently had hardware removed from his back and he is still recovering and rehabilitating from this. He is able to dance Tuesdays and Saturdays but does very little exercise otherwise. He had blood work 3 weeks ago in the [place] by his primary care doctor [name].     Past Medical History:  Diagnosis Date  . Arthritis   . Chronic kidney disease   . Constipation    uses stool softener daily  . GERD (gastroesophageal reflux disease)   .  Hypercholesteremia   . Hypertension   . Hypothyroidism   . Myocardial infarction [facility]) 2009   Dr. Cathren Harsh cardiologist 2 stents    Past Surgical History:  Procedure Laterality Date  . APPENDECTOMY    . BACK SURGERY     several   . CARDIAC CATHETERIZATION    . CORONARY ANGIOPLASTY     3 years ago  . LUMBAR LAMINECTOMY/ DECOMPRESSION WITH MET-RX N/A 03/05/2015   Procedure: Removal of posterior hardware Lumbar one with Metrex;  Surgeon: Kristeen Miss, MD;  Location: [facility];  Service: Neurosurgery;  Laterality: N/A;  . SKIN SURGERY     skin cancer removed from forehead    Current Medications: Outpatient Medications Prior to Visit  Medication Sig Dispense Refill  . aspirin EC 81 MG tablet Take 81 mg by mouth daily.    . CASCARA SAGRADA PO Take 1 tablet by mouth at bedtime. Stool softner    . HYDROcodone-acetaminophen (NORCO/VICODIN) 5-325 MG tablet Take 1-2 tablets by mouth every 6 (six) hours as needed for moderate pain.    Marland Kitchen HYDROcodone-acetaminophen (NORCO/VICODIN) 5-325 MG tablet Take 1-2 tablets by mouth every 4 (four) hours as needed (mild pain). 60 tablet 0  . levothyroxine (SYNTHROID, LEVOTHROID) 75 MCG tablet Take 75 mcg by mouth daily before breakfast.    . lisinopril (PRINIVIL,ZESTRIL) 5 MG tablet Take 5 mg by mouth daily.    . metoprolol tartrate (LOPRESSOR) 25 MG tablet Take 25 mg by mouth 2 (two) times daily.     . pravastatin (PRAVACHOL) 40 MG tablet Take 40 mg by mouth at bedtime.    Marland Kitchen  tiZANidine (ZANAFLEX) 4 MG tablet Take 4 mg by mouth every 6 (six) hours as needed for muscle spasms.     No facility-administered medications prior to visit.      Allergies:   Mobic [meloxicam]   Social History   Social History  . Marital status: Widowed    Spouse name: N/A  . Number of children: N/A  . Years of education: N/A   Social History Main Topics  . Smoking status: Former Smoker    Start date: 01/20/1955    Quit date: 01/20/1960  . Smokeless tobacco:  Never Used  . Alcohol use No  . Drug use: No  . Sexual activity: Not Asked   Other Topics Concern  . None   Social History Narrative  . None     Family History:  The patient's   family history includes Alzheimer's disease in his mother.   ROS:   Please see the history of present illness.    Review of Systems  Constitution: Negative.  HENT: Positive for hearing loss.   Cardiovascular: Negative.   Respiratory: Negative.   Endocrine: Negative.   Hematologic/Lymphatic: Negative.   Musculoskeletal: Positive for arthritis, back pain, muscle weakness, myalgias and stiffness.  Gastrointestinal: Negative.   Genitourinary: Negative.   Neurological: Negative.    All other systems reviewed and are negative.   PHYSICAL EXAM:   VS:  BP (!) 153/79   Pulse (!) 59   Ht 5' 10"  (1.778 m)   Wt 207 lb (93.9 kg)   SpO2 92%   BMI 29.70 kg/m   Physical Exam  GEN: Well nourished, well developed, in no acute distress  Neck: Bilateral carotid bruits no JVD, or masses Cardiac:RRR; no murmurs, rubs, or gallops  Respiratory:  clear to auscultation bilaterally, normal work of breathing GI: soft, nontender, nondistended, + BS Ext: without cyanosis, clubbing, or edema, Good distal pulses bilaterally MS: no deformity or atrophy  Skin: warm and dry, no rash Psych: euthymic mood, full affect  Wt Readings from Last 3 Encounters:  09/18/15 207 lb (93.9 kg)  03/05/15 196 lb (88.9 kg)  03/01/15 196 lb (88.9 kg)      Studies/Labs Reviewed:   EKG:  EKG is not ordered today.   Recent Labs: 02/26/2015: BUN 20; Creatinine, Ser 1.26; Hemoglobin 13.2; Platelets 271; Potassium 3.8; Sodium 140   Lipid Panel No results found for: CHOL, TRIG, HDL, CHOLHDL, VLDL, LDLCALC, LDLDIRECT  Additional studies/ records that were reviewed today include:  Cath report reviewed from 2012 done at [facility] see above dictation for details  Carotid Dopplers 2013 stable  ASSESSMENT:    1. Bruit    2. ASCVD (arteriosclerotic cardiovascular disease)   3. Essential hypertension   4. Hyperlipidemia      PLAN:  In order of problems listed above:  Bilateral carotid bruits: It's been since 2013 that he had Dopplers checked. Will order carotid Dopplers  CAD status post stent placements while living in [place] last cardiac catheterization 2012. No angina. Fairly inactive since hardware removed from his back. Increase exercise as tolerated continue aspirin, pravastatin, metoprolol and lisinopril  Essential hypertension blood pressure a little on the high side but having back pain. Just started on gabapentin by Dr. Ellene Route  Hyperlipidemia on pravastatin labs recently checked by Dr. Woody Seller    Medication Adjustments/Labs and Tests Ordered: Current medicines are reviewed at length with the patient today.  Concerns regarding medicines are outlined above.  Medication changes, Labs and Tests ordered today are  listed in the Patient Instructions below. There are no Patient Instructions on file for this visit.   Signed, Ermalinda Barrios, PA-C  09/18/2015 11:13 AM    [facility] [place], [place], [place]  [address] Phone: [phone]; Fax: [phone]

## 2015-09-20 ENCOUNTER — Ambulatory Visit (HOSPITAL_COMMUNITY)
Admission: RE | Admit: 2015-09-20 | Discharge: 2015-09-20 | Disposition: A | Discharge status: Home / Self Care | Payer: Medicare Other | Source: Ambulatory Visit | Attending: Physician Assistant | Admitting: Physician Assistant

## 2015-09-20 DIAGNOSIS — I6523 Occlusion and stenosis of bilateral carotid arteries: Secondary | ICD-10-CM | POA: Insufficient documentation

## 2015-09-20 DIAGNOSIS — R0989 Other specified symptoms and signs involving the circulatory and respiratory systems: Secondary | ICD-10-CM | POA: Diagnosis not present

## 2017-05-28 ENCOUNTER — Ambulatory Visit: Payer: Medicare Other | Admitting: Cardiology

## 2017-05-28 ENCOUNTER — Encounter: Payer: Self-pay | Admitting: Cardiology

## 2017-05-28 VITALS — BP 130/80 | HR 71 | Ht 70.0 in | Wt 211.0 lb

## 2017-05-28 DIAGNOSIS — I6523 Occlusion and stenosis of bilateral carotid arteries: Secondary | ICD-10-CM

## 2017-05-28 DIAGNOSIS — N183 Chronic kidney disease, stage 3 unspecified: Secondary | ICD-10-CM

## 2017-05-28 DIAGNOSIS — I739 Peripheral vascular disease, unspecified: Secondary | ICD-10-CM

## 2017-05-28 DIAGNOSIS — I779 Disorder of arteries and arterioles, unspecified: Secondary | ICD-10-CM | POA: Insufficient documentation

## 2017-05-28 DIAGNOSIS — Z955 Presence of coronary angioplasty implant and graft: Secondary | ICD-10-CM

## 2017-05-28 DIAGNOSIS — M48062 Spinal stenosis, lumbar region with neurogenic claudication: Secondary | ICD-10-CM | POA: Diagnosis not present

## 2017-05-28 NOTE — Assessment & Plan Note (Signed)
Chronic back issues but he compensates well

## 2017-05-28 NOTE — Progress Notes (Signed)
05/28/2017 Ronnie Gregory   1933-11-11  [id number]  Primary Physician Ignatius Specking, MD Primary Cardiologist: Dr Purvis Sheffield  HPI:  Pleasant 84 y/o male here with his daughter for evaluation. Apparently the pt's other daughter thought he had some LE edema and wanted him evaluated. The pt denies any edema and does not have any on exam. He is pretty active, he denies chest pain.    Current Outpatient Medications  Medication Sig Dispense Refill  . aspirin EC 81 MG tablet Take 81 mg by mouth daily.    . CASCARA SAGRADA PO Take 1 tablet by mouth at bedtime. Stool softner    . HYDROcodone-acetaminophen (NORCO/VICODIN) 5-325 MG tablet Take 1-2 tablets by mouth every 6 (six) hours as needed for moderate pain.    Marland Kitchen levothyroxine (SYNTHROID, LEVOTHROID) 75 MCG tablet Take 75 mcg by mouth daily before breakfast.    . lisinopril (PRINIVIL,ZESTRIL) 5 MG tablet Take 5 mg by mouth daily.    . metoprolol tartrate (LOPRESSOR) 25 MG tablet Take 25 mg by mouth 2 (two) times daily.     . pravastatin (PRAVACHOL) 40 MG tablet Take 40 mg by mouth at bedtime.    Marland Kitchen tiZANidine (ZANAFLEX) 4 MG tablet Take 4 mg by mouth every 6 (six) hours as needed for muscle spasms.     No current facility-administered medications for this visit.     Allergies  Allergen Reactions  . Mobic [Meloxicam] Other (See Comments)    Mucus    Past Medical History:  Diagnosis Date  . Arthritis   . Chronic kidney disease   . Constipation    uses stool softener daily  . GERD (gastroesophageal reflux disease)   . Hypercholesteremia   . Hypertension   . Hypothyroidism   . Myocardial infarction [facility]) 2009   Dr. Caryn Section cardiologist 2 stents    Social History   Socioeconomic History  . Marital status: Widowed    Spouse name: Not on file  . Number of children: Not on file  . Years of education: Not on file  . Highest education level: Not on file  Occupational History  . Not on file  Social Needs  .  Financial resource strain: Not on file  . Food insecurity:    Worry: Not on file    Inability: Not on file  . Transportation needs:    Medical: Not on file    Non-medical: Not on file  Tobacco Use  . Smoking status: Former Smoker    Start date: 01/20/1955    Last attempt to quit: 01/20/1960    Years since quitting: 57.3  . Smokeless tobacco: Never Used  Substance and Sexual Activity  . Alcohol use: No    Alcohol/week: 0.0 oz  . Drug use: No  . Sexual activity: Not on file  Lifestyle  . Physical activity:    Days per week: Not on file    Minutes per session: Not on file  . Stress: Not on file  Relationships  . Social connections:    Talks on phone: Not on file    Gets together: Not on file    Attends religious service: Not on file    Active member of club or organization: Not on file    Attends meetings of clubs or organizations: Not on file    Relationship status: Not on file  . Intimate partner violence:    Fear of current or ex partner: Not on file    Emotionally abused: Not  on file    Physically abused: Not on file    Forced sexual activity: Not on file  Other Topics Concern  . Not on file  Social History Narrative  . Not on file     Family History  Problem Relation Age of Onset  . Alzheimer's disease Mother      Review of Systems: General: negative for chills, fever, night sweats or weight changes.  Cardiovascular: negative for chest pain, dyspnea on exertion, edema, orthopnea, palpitations, paroxysmal nocturnal dyspnea or shortness of breath Dermatological: negative for rash Respiratory: negative for cough or wheezing Urologic: negative for hematuria Abdominal: negative for nausea, vomiting, diarrhea, bright red blood per rectum, melena, or hematemesis Neurologic: negative for visual changes, syncope, or dizziness Chronic back pain HOH All other systems reviewed and are otherwise negative except as noted above.    Blood pressure 130/80, pulse 71, height   (1.778 m), weight 211 lb (95.7 kg), SpO2 94 %.  General appearance: alert, cooperative and no distress Neck: no carotid bruit and no JVD Lungs: clear to auscultation bilaterally Heart: regular rate and rhythm Extremities: no edema Pulses: 2+ and symmetric Skin: Skin color, texture, turgor normal. No rashes or lesions Neurologic: Grossly normal  EKG NSR  ASSESSMENT AND PLAN:   S/P coronary artery stent placement History of prior stent-2012- [facility] [place] stents to distal RCA and PDA  CRI (chronic renal insufficiency), stage 3 (moderate) (HCC) Followed by Dr Sherril Croon in [facility]  Carotid artery disease (HCC) < 50% bilaterally-2017  Spinal stenosis, lumbar region, with neurogenic claudication Chronic back issues but he compensates well   PLAN  Will request records from Dr Sarajane Marek. F/U Dr Purvis Sheffield.   Corine Shelter PA-C 05/28/2017 2:26 PM

## 2017-05-28 NOTE — Assessment & Plan Note (Signed)
History of prior stent-2012- [facility] [place] stents to distal RCA and PDA

## 2017-05-28 NOTE — Assessment & Plan Note (Signed)
<   50% bilaterally-2017

## 2017-05-28 NOTE — Patient Instructions (Addendum)
Your physician wants you to follow-up in:  1 year with Dr.Koneswaran You will receive a reminder letter in the mail two months in advance. If you don't receive a letter, please call our office to schedule the follow-up appointment.    Your physician recommends that you continue on your current medications as directed. Please refer to the Current Medication list given to you today.    If you need a refill on your cardiac medications before your next appointment, please call your pharmacy.      No lab work or tests ordered today.      Thank you for choosing [facility] !        

## 2017-05-28 NOTE — Assessment & Plan Note (Signed)
Followed by Dr Sherril Croon in [place]

## 2017-07-17 DIAGNOSIS — E039 Hypothyroidism, unspecified: Secondary | ICD-10-CM | POA: Diagnosis not present

## 2017-07-17 DIAGNOSIS — N200 Calculus of kidney: Secondary | ICD-10-CM | POA: Diagnosis not present

## 2017-07-17 DIAGNOSIS — E7800 Pure hypercholesterolemia, unspecified: Secondary | ICD-10-CM | POA: Diagnosis not present

## 2017-07-17 DIAGNOSIS — Z87891 Personal history of nicotine dependence: Secondary | ICD-10-CM | POA: Diagnosis not present

## 2017-07-17 DIAGNOSIS — N23 Unspecified renal colic: Secondary | ICD-10-CM | POA: Diagnosis not present

## 2017-07-17 DIAGNOSIS — Z79890 Hormone replacement therapy (postmenopausal): Secondary | ICD-10-CM | POA: Diagnosis not present

## 2017-07-17 DIAGNOSIS — Z79899 Other long term (current) drug therapy: Secondary | ICD-10-CM | POA: Diagnosis not present

## 2017-07-17 DIAGNOSIS — N201 Calculus of ureter: Secondary | ICD-10-CM | POA: Diagnosis not present

## 2017-07-17 DIAGNOSIS — I10 Essential (primary) hypertension: Secondary | ICD-10-CM | POA: Diagnosis not present

## 2017-07-25 DIAGNOSIS — Z87891 Personal history of nicotine dependence: Secondary | ICD-10-CM | POA: Diagnosis not present

## 2017-07-25 DIAGNOSIS — M5412 Radiculopathy, cervical region: Secondary | ICD-10-CM | POA: Diagnosis not present

## 2017-07-25 DIAGNOSIS — M542 Cervicalgia: Secondary | ICD-10-CM | POA: Diagnosis not present

## 2017-07-25 DIAGNOSIS — Z79899 Other long term (current) drug therapy: Secondary | ICD-10-CM | POA: Diagnosis not present

## 2017-07-25 DIAGNOSIS — G8929 Other chronic pain: Secondary | ICD-10-CM | POA: Diagnosis not present

## 2017-07-25 DIAGNOSIS — Z7982 Long term (current) use of aspirin: Secondary | ICD-10-CM | POA: Diagnosis not present

## 2017-07-25 DIAGNOSIS — I10 Essential (primary) hypertension: Secondary | ICD-10-CM | POA: Diagnosis not present

## 2017-07-25 DIAGNOSIS — E039 Hypothyroidism, unspecified: Secondary | ICD-10-CM | POA: Diagnosis not present

## 2017-07-25 DIAGNOSIS — M545 Low back pain: Secondary | ICD-10-CM | POA: Diagnosis not present

## 2017-08-10 DIAGNOSIS — M5030 Other cervical disc degeneration, unspecified cervical region: Secondary | ICD-10-CM | POA: Diagnosis not present

## 2017-08-18 DIAGNOSIS — M4722 Other spondylosis with radiculopathy, cervical region: Secondary | ICD-10-CM | POA: Diagnosis not present

## 2017-08-27 ENCOUNTER — Other Ambulatory Visit: Payer: Self-pay

## 2017-08-27 ENCOUNTER — Emergency Department (HOSPITAL_BASED_OUTPATIENT_CLINIC_OR_DEPARTMENT_OTHER)
Admission: EM | Admit: 2017-08-27 | Discharge: 2017-08-27 | Disposition: A | Discharge status: Home / Self Care | Payer: Medicare HMO | Attending: Emergency Medicine | Admitting: Emergency Medicine

## 2017-08-27 ENCOUNTER — Encounter (HOSPITAL_BASED_OUTPATIENT_CLINIC_OR_DEPARTMENT_OTHER): Payer: Self-pay | Admitting: Emergency Medicine

## 2017-08-27 ENCOUNTER — Emergency Department (HOSPITAL_BASED_OUTPATIENT_CLINIC_OR_DEPARTMENT_OTHER): Payer: Medicare HMO

## 2017-08-27 DIAGNOSIS — Z87891 Personal history of nicotine dependence: Secondary | ICD-10-CM | POA: Insufficient documentation

## 2017-08-27 DIAGNOSIS — I2510 Atherosclerotic heart disease of native coronary artery without angina pectoris: Secondary | ICD-10-CM | POA: Insufficient documentation

## 2017-08-27 DIAGNOSIS — Z955 Presence of coronary angioplasty implant and graft: Secondary | ICD-10-CM | POA: Insufficient documentation

## 2017-08-27 DIAGNOSIS — M25552 Pain in left hip: Secondary | ICD-10-CM | POA: Insufficient documentation

## 2017-08-27 DIAGNOSIS — I10 Essential (primary) hypertension: Secondary | ICD-10-CM

## 2017-08-27 DIAGNOSIS — M25551 Pain in right hip: Secondary | ICD-10-CM | POA: Insufficient documentation

## 2017-08-27 DIAGNOSIS — N183 Chronic kidney disease, stage 3 (moderate): Secondary | ICD-10-CM | POA: Diagnosis not present

## 2017-08-27 DIAGNOSIS — I129 Hypertensive chronic kidney disease with stage 1 through stage 4 chronic kidney disease, or unspecified chronic kidney disease: Secondary | ICD-10-CM | POA: Insufficient documentation

## 2017-08-27 DIAGNOSIS — I252 Old myocardial infarction: Secondary | ICD-10-CM | POA: Diagnosis not present

## 2017-08-27 DIAGNOSIS — E039 Hypothyroidism, unspecified: Secondary | ICD-10-CM | POA: Insufficient documentation

## 2017-08-27 DIAGNOSIS — Z79899 Other long term (current) drug therapy: Secondary | ICD-10-CM | POA: Insufficient documentation

## 2017-08-27 DIAGNOSIS — Z7982 Long term (current) use of aspirin: Secondary | ICD-10-CM | POA: Insufficient documentation

## 2017-08-27 NOTE — ED Triage Notes (Signed)
Reports ongoing bilateral hip pain.  Reports currently seeing Dr. Christain SacramentoElzner at [place] with next appt in sept.  Denies injury.

## 2017-08-27 NOTE — Discharge Instructions (Signed)
Please see the information and instructions below regarding your visit.  Your diagnoses today include:  1. Bilateral hip pain   2. Elevated blood pressure reading with diagnosis of hypertension     Tests performed today include: See side panel of your discharge paperwork for testing performed today. Vital signs are listed at the bottom of these instructions.   X-ray shows osteoarthritis in both of your hips.  Medications prescribed:    Take any prescribed medications only as prescribed, and any over the counter medications only as directed on the packaging.  Please use your home pain regimens including Voltaren gel over bilateral hips.  Home care instructions:  Please follow any educational materials contained in this packet.   Follow-up instructions: Please follow-up with your primary care provider in one week for further evaluation of your mood if you are having thoughts of feeling down.  Please follow up with reviewing orthopedics as soon as possible to discuss the arthritis in your hips.  Return instructions:  Please return to the Emergency Department if you experience worsening symptoms.  Please return to the emergency department if you develop any fevers with joint pain, increasing swelling around her joints, weakness or numbness in your legs, numbness in your groin, loss of bowel or bladder control. Please return if you have any other emergent concerns.  Additional Information:   Your vital signs today were: BP (!) 146/78 (BP Location: Right Arm)    Pulse 78    Temp 97.8 F (36.6 C)    Resp 16    Ht 5\' 7"  (1.702 m)    Wt 88.5 kg    SpO2 96%    BMI 30.54 kg/m  If your blood pressure (BP) was elevated on multiple readings during this visit above 130 for the top number or above 80 for the bottom number, please have this repeated by your primary care provider within one month. --------------  Thank you for allowing us to participate in your care today.

## 2017-08-27 NOTE — ED Notes (Signed)
Patient transported to X-ray 

## 2017-08-27 NOTE — ED Provider Notes (Signed)
[facility] EMERGENCY DEPARTMENT Provider Note   CSN: [id number] Arrival date & time: 08/27/17  1625     History   Chief Complaint Chief Complaint  Patient presents with  . Hip Pain    HPI Ronnie Gregory is a 84 y.o. male.  HPI  Patient is an 84 year old male with a history of CKD, spinal stenosis of lumbar region, status post multiple lumbar fusions, hypercholesterolemia, hypertension, hypothyroidism, and MI presenting for bilateral hip pain.  Patient has difficulty identifying exactly when his pain started getting worse, but his daughter noted that he was getting bad 2 weeks ago when she took him to [place] with orthopedics to receive cortisone injection.  Patient reports he has had progressive difficulty ambulating on his hips due to the pain.  He is unable to identify which one hurts worse.  Patient reports that he also has pain in his lower back, unable to tell if it is worse than prior.  Patient denies any abdominal tenderness, nausea or vomiting.  Patient denies any loss of sensation or weakness in his distal lower extremity's.  Patient denies any fever or chills, erythema or edema overlying skin of bilateral hips.  Past Medical History:  Diagnosis Date  . Arthritis   . Chronic kidney disease   . Constipation    uses stool softener daily  . GERD (gastroesophageal reflux disease)   . Hypercholesteremia   . Hypertension   . Hypothyroidism   . Myocardial infarction [facility]) 2009   Dr. Cathren Harsh cardiologist 2 stents    Patient Active Problem List   Diagnosis Date Noted  . CRI (chronic renal insufficiency), stage 3 (moderate) ([place]) 05/28/2017  . Carotid artery disease ([place]) 05/28/2017  . Pseudoarthrosis of lumbar spine 03/05/2015  . Spinal stenosis, lumbar region, with neurogenic claudication 10/16/2014  . CAD (coronary artery disease) 12/02/2012  . S/P coronary artery stent placement 12/02/2012    Past Surgical History:  Procedure Laterality Date    . APPENDECTOMY    . BACK SURGERY     several   . CARDIAC CATHETERIZATION    . CORONARY ANGIOPLASTY     3 years ago  . LUMBAR LAMINECTOMY/ DECOMPRESSION WITH MET-RX N/A 03/05/2015   Procedure: Removal of posterior hardware Lumbar one with Metrex;  Surgeon: Kristeen Miss, MD;  Location: [place] NEURO ORS;  Service: Neurosurgery;  Laterality: N/A;  . SKIN SURGERY     skin cancer removed from forehead        Home Medications    Prior to Admission medications   Medication Sig Start Date End Date Taking? Authorizing Provider  aspirin EC 81 MG tablet Take 81 mg by mouth daily.    [provider]  CASCARA SAGRADA PO Take 1 tablet by mouth at bedtime. Stool softner    [provider]  HYDROcodone-acetaminophen (NORCO/VICODIN) 5-325 MG tablet Take 1-2 tablets by mouth every 6 (six) hours as needed for moderate pain.    [provider]  levothyroxine (SYNTHROID, LEVOTHROID) 75 MCG tablet Take 75 mcg by mouth daily before breakfast.    [provider]  lisinopril (PRINIVIL,ZESTRIL) 5 MG tablet Take 5 mg by mouth daily.    [provider]  metoprolol tartrate (LOPRESSOR) 25 MG tablet Take 25 mg by mouth 2 (two) times daily.     [provider]  pravastatin (PRAVACHOL) 40 MG tablet Take 40 mg by mouth at bedtime.    [provider]  tiZANidine (ZANAFLEX) 4 MG tablet Take 4 mg by mouth  every 6 (six) hours as needed for muscle spasms.    [provider]    Family History Family History  Problem Relation Age of Onset  . Alzheimer's disease Mother     Social History Social History   Tobacco Use  . Smoking status: Former Smoker    Start date: 01/20/1955    Last attempt to quit: 01/20/1960    Years since quitting: 72.6  . Smokeless tobacco: Never Used  Substance Use Topics  . Alcohol use: No    Alcohol/week: 0.0 standard drinks  . Drug use: No     Allergies   Mobic [meloxicam]   Review of Systems Review of Systems   Constitutional: Negative for chills and fever.  Gastrointestinal: Negative for abdominal pain, nausea and vomiting.  Genitourinary: Negative for dysuria, flank pain, frequency and urgency.  Musculoskeletal: Positive for arthralgias, back pain and myalgias. Negative for joint swelling.  Skin: Negative for color change.  Neurological: Negative for weakness and numbness.  All other systems reviewed and are negative.    Physical Exam Updated Vital Signs BP 130/75   Pulse 80   Temp 97.8 F (36.6 C)   Resp 18   Ht 5' 7"  (1.702 m)   Wt 88.5 kg   SpO2 99%   BMI 30.54 kg/m   Physical Exam  Constitutional: He appears well-developed and well-nourished. No distress.  HENT:  Head: Normocephalic and atraumatic.  Mouth/Throat: Oropharynx is clear and moist.  Eyes: Pupils are equal, round, and reactive to light. Conjunctivae and EOM are normal.  Neck: Normal range of motion. Neck supple.  Cardiovascular: Normal rate, regular rhythm, S1 normal and S2 normal.  No murmur heard. Pulmonary/Chest: Effort normal and breath sounds normal. He has no wheezes. He has no rales.  Abdominal: Soft. He exhibits no distension. There is no tenderness. There is no guarding.  Musculoskeletal: Normal range of motion. He exhibits no edema or deformity.  Bilateral hips without erythema edema overlying the hips.  No tenderness to palpation over trochanteric bursa.  There is tenderness to palpation overlying bilateral ischial spines. No erythema overlying back.  Patient has prior surgical scars noted on the back. There is no midline tenderness of cervical, thoracic, or lumbar spine.   Strength: 5/5 throughout LE bilaterally (hip flexion/extension, adduction/abduction; knee flexion/extension; foot dorsiflexion/plantarflexion, inversion/eversion; great toe inversion) Sensation: Intact to light touch in proximal and distal LE bilaterally  Compartments of bilateral lower extremities are soft.  Neurological: He is  alert.  Cranial nerves grossly intact. Patient moves extremities symmetrically and with good coordination. Antalgic gait, but no evidence of weakness or footdrop.  Skin: Skin is warm and dry. No rash noted. No erythema.  Psychiatric: He has a normal mood and affect. His behavior is normal. Judgment and thought content normal.  Nursing note and vitals reviewed.    ED Treatments / Results  Labs (all labs ordered are listed, but only abnormal results are displayed) Labs Reviewed - No data to display  EKG None  Radiology Dg Hips Bilat W Or Wo Pelvis 3-4 Views  Result Date: 08/27/2017 CLINICAL DATA:  Chronic bilateral hip pain. EXAM: DG HIP (WITH OR WITHOUT PELVIS) 3-4V BILAT COMPARISON:  None. FINDINGS: There is no evidence of hip fracture or dislocation. There are mild bilateral hip joint space narrowing. IMPRESSION: No acute fracture or dislocation. Mild degenerative joint changes of bilateral hips. Electronically Signed   By: Abelardo Diesel M.D.   On: 08/27/2017 18:39    Procedures Procedures (including critical care  time)  Medications Ordered in ED Medications - No data to display   Initial Impression / Assessment and Plan / ED Course  I have reviewed the triage vital signs and the nursing notes.  Pertinent labs & imaging results that were available during my care of the patient were reviewed by me and considered in my medical decision making (see chart for details).     Patient nontoxic-appearing, afebrile, and with neurologically intact bilateral lower extremity's.  Differential diagnosis includes spinal stenosis leading to bilateral radiculopathy, osteoarthritis of bilateral hips.  Do not suspect septic arthritis, as patient has no erythema, edema, bogginess, or bony tenderness of bilateral hips.  No preceding trauma to suggest acute fracture.  Unclear timeframe the patient has been experiencing symptoms, however it appears that symptoms are acute on chronic today.  Plain  films of bilateral hips demonstrate osteoarthritis, but no fractures or lytic lesions.  Patient seen by orthopedics for initial consult 3 weeks ago and received cortisone shot.  Patient followed by neurosurgery, but not primarily orthopedics.  I encouraged patient to return to orthopedics and discuss management of osteoarthritis.  Patient lives alone, is otherwise functionally intact and able to manage his activities of daily living.  Patient did express sadness at living alone, and the loss of his wife 4 years ago during emergency department visit.  Patient was encouraged to follow-up with primary care provider regarding mood, and family at bedside states that they will encourage more frequent stays at the house by family for assistance of patient.  Return precautions were given to patient and his family for any worsening pain, redness surrounding hips, weakness or numbness in lower extremities.  Patient and family are in understanding and agree with the plan of care.  This is a shared visit with Dr. Blanchie Dessert. Patient was independently evaluated by this attending physician. Attending physician consulted in evaluation and discharge management.  Final Clinical Impressions(s) / ED Diagnoses   Final diagnoses:  Bilateral hip pain  Elevated blood pressure reading with diagnosis of hypertension    ED Discharge Orders    None       Tamala Julian 08/27/17 2133    Blanchie Dessert, MD 08/28/17 0041

## 2017-09-03 DIAGNOSIS — M545 Low back pain: Secondary | ICD-10-CM | POA: Diagnosis not present

## 2017-09-03 DIAGNOSIS — M9901 Segmental and somatic dysfunction of cervical region: Secondary | ICD-10-CM | POA: Diagnosis not present

## 2017-09-03 DIAGNOSIS — M9903 Segmental and somatic dysfunction of lumbar region: Secondary | ICD-10-CM | POA: Diagnosis not present

## 2017-09-03 DIAGNOSIS — M542 Cervicalgia: Secondary | ICD-10-CM | POA: Diagnosis not present

## 2017-09-03 DIAGNOSIS — M62830 Muscle spasm of back: Secondary | ICD-10-CM | POA: Diagnosis not present

## 2017-09-06 DIAGNOSIS — M9901 Segmental and somatic dysfunction of cervical region: Secondary | ICD-10-CM | POA: Diagnosis not present

## 2017-09-06 DIAGNOSIS — M9903 Segmental and somatic dysfunction of lumbar region: Secondary | ICD-10-CM | POA: Diagnosis not present

## 2017-09-06 DIAGNOSIS — M62830 Muscle spasm of back: Secondary | ICD-10-CM | POA: Diagnosis not present

## 2017-09-06 DIAGNOSIS — M542 Cervicalgia: Secondary | ICD-10-CM | POA: Diagnosis not present

## 2017-09-06 DIAGNOSIS — M545 Low back pain: Secondary | ICD-10-CM | POA: Diagnosis not present

## 2017-09-30 DIAGNOSIS — M4722 Other spondylosis with radiculopathy, cervical region: Secondary | ICD-10-CM | POA: Diagnosis not present

## 2018-02-10 DIAGNOSIS — M545 Low back pain: Secondary | ICD-10-CM | POA: Diagnosis not present

## 2018-02-10 DIAGNOSIS — G8929 Other chronic pain: Secondary | ICD-10-CM | POA: Diagnosis not present

## 2018-02-22 DIAGNOSIS — H2513 Age-related nuclear cataract, bilateral: Secondary | ICD-10-CM | POA: Diagnosis not present

## 2018-02-22 DIAGNOSIS — H40033 Anatomical narrow angle, bilateral: Secondary | ICD-10-CM | POA: Diagnosis not present

## 2018-03-16 DIAGNOSIS — Z6831 Body mass index (BMI) 31.0-31.9, adult: Secondary | ICD-10-CM | POA: Diagnosis not present

## 2018-03-16 DIAGNOSIS — E039 Hypothyroidism, unspecified: Secondary | ICD-10-CM | POA: Diagnosis not present

## 2018-03-16 DIAGNOSIS — Z299 Encounter for prophylactic measures, unspecified: Secondary | ICD-10-CM | POA: Diagnosis not present

## 2018-03-16 DIAGNOSIS — E7800 Pure hypercholesterolemia, unspecified: Secondary | ICD-10-CM | POA: Diagnosis not present

## 2018-03-16 DIAGNOSIS — I10 Essential (primary) hypertension: Secondary | ICD-10-CM | POA: Diagnosis not present

## 2018-03-16 DIAGNOSIS — Z87891 Personal history of nicotine dependence: Secondary | ICD-10-CM | POA: Diagnosis not present

## 2018-03-28 DIAGNOSIS — B351 Tinea unguium: Secondary | ICD-10-CM | POA: Diagnosis not present

## 2018-08-24 DIAGNOSIS — H25813 Combined forms of age-related cataract, bilateral: Secondary | ICD-10-CM | POA: Diagnosis not present

## 2018-08-24 DIAGNOSIS — H43393 Other vitreous opacities, bilateral: Secondary | ICD-10-CM | POA: Diagnosis not present

## 2018-08-24 DIAGNOSIS — H43823 Vitreomacular adhesion, bilateral: Secondary | ICD-10-CM | POA: Diagnosis not present

## 2019-02-10 DIAGNOSIS — M5412 Radiculopathy, cervical region: Secondary | ICD-10-CM | POA: Diagnosis not present

## 2019-02-10 DIAGNOSIS — Z6830 Body mass index (BMI) 30.0-30.9, adult: Secondary | ICD-10-CM | POA: Diagnosis not present

## 2019-02-10 DIAGNOSIS — M4722 Other spondylosis with radiculopathy, cervical region: Secondary | ICD-10-CM | POA: Diagnosis not present

## 2019-03-19 DIAGNOSIS — I10 Essential (primary) hypertension: Secondary | ICD-10-CM | POA: Diagnosis not present

## 2019-03-19 DIAGNOSIS — E7800 Pure hypercholesterolemia, unspecified: Secondary | ICD-10-CM | POA: Diagnosis not present

## 2019-03-23 DIAGNOSIS — M5412 Radiculopathy, cervical region: Secondary | ICD-10-CM | POA: Diagnosis not present

## 2019-03-24 ENCOUNTER — Telehealth: Payer: Self-pay

## 2019-03-24 NOTE — Telephone Encounter (Signed)
Virtual Visit Pre-Appointment Phone Call  "(Name), I am calling you today to discuss your upcoming appointment. We are currently trying to limit exposure to the virus that causes COVID-19 by seeing patients at home rather than in the office."  1. "What is the BEST phone number to call the day of the visit?" - include this in appointment notes  2. "Do you have or have access to (through a family member/friend) a smartphone with video capability that we can use for your visit?" a. If yes - list this number in appt notes as "cell" (if different from BEST phone #) and list the appointment type as a VIDEO visit in appointment notes b. If no - list the appointment type as a PHONE visit in appointment notes  Confirm consent - "In the setting of the current Covid19 crisis, you are scheduled for a (phone or video) visit with your provider on (date) at (time).  Just as we do with many in-office visits, in order for you to participate in this visit, we must obtain consent.  If you'd like, I can send this to your mychart (if signed up) or email for you to review.  Otherwise, I can obtain your verbal consent now.  All virtual visits are billed to your insurance company just like a normal visit would be.  By agreeing to a virtual visit, we'd like you to understand that the technology does not allow for your provider to perform an examination, and thus may limit your provider's ability to fully assess your condition. If your provider identifies any concerns that need to be evaluated in person, we will make arrangements to do so.  Finally, though the technology is pretty good, we cannot assure that it will always work on either your or our end, and in the setting of a video visit, we may have to convert it to a phone-only visit.  In either situation, we cannot ensure that we have a secure connection.  Are you willing to proceed?" STAFF: Did the patient verbally acknowledge consent to telehealth visit? Document  YES/NO here:  3. Advise patient to be prepared - "Two hours prior to your appointment, go ahead and check your blood pressure, pulse, oxygen saturation, and your weight (if you have the equipment to check those) and write them all down. When your visit starts, your provider will ask you for this information. If you have an Apple Watch or Kardia device, please plan to have heart rate information ready on the day of your appointment. Please have a pen and paper handy nearby the day of the visit as well."  4. Give patient instructions for MyChart download to smartphone OR Doximity/[email or web address] as below if video visit (depending on what platform provider is using)  5. Inform patient they will receive a phone call 15 minutes prior to their appointment time (may be from unknown caller ID) so they should be prepared to answer    TELEPHONE CALL NOTE  PLATO ALSPAUGH has been deemed a candidate for a follow-up tele-health visit to limit community exposure during the Covid-19 pandemic. I spoke with the patient via phone to ensure availability of phone/video source, confirm preferred email & phone number, and discuss instructions and expectations.  I reminded Ronnie Gregory to be prepared with any vital sign and/or heart rhythm information that could potentially be obtained via home monitoring, at the time of his visit. I reminded Ronnie Gregory to expect a phone call prior to his visit.  Gracy Bruins 03/24/2019 11:13 AM   INSTRUCTIONS FOR DOWNLOADING THE MYCHART APP TO SMARTPHONE  - The patient must first make sure to have activated MyChart and know their login information - If Apple, go to [employer] and type in MyChart in the search bar and download the app. If Android, ask patient to go to [employer] and type in [place] in the search bar and download the app. The app is free but as with any other app downloads, their phone may require them to verify saved payment information or  Apple/Android password.  - The patient will need to then log into the app with their MyChart username and password, and select  as their healthcare provider to link the account. When it is time for your visit, go to the MyChart app, find appointments, and click Begin Video Visit. Be sure to Select Allow for your device to access the Microphone and Camera for your visit. You will then be connected, and your provider will be with you shortly.  **If they have any issues connecting, or need assistance please contact MyChart service desk [phone])**  **If using a computer, in order to ensure the best quality for their visit they will need to use either of the following Internet Browsers: [employer] or Google Chrome**  IF USING DOXIMITY or DOXY.ME - The patient will receive a link just prior to their visit by text.     FULL LENGTH CONSENT FOR TELE-HEALTH VISIT   I hereby voluntarily request, consent and authorize [facility] and its employed or contracted physicians, physician assistants, nurse practitioners or other licensed health care professionals (the Practitioner), to provide me with telemedicine health care services (the "Services") as deemed necessary by the treating Practitioner. I acknowledge and consent to receive the Services by the Practitioner via telemedicine. I understand that the telemedicine visit will involve communicating with the Practitioner through live audiovisual communication technology and the disclosure of certain medical information by electronic transmission. I acknowledge that I have been given the opportunity to request an in-person assessment or other available alternative prior to the telemedicine visit and am voluntarily participating in the telemedicine visit.  I understand that I have the right to withhold or withdraw my consent to the use of telemedicine in the course of my care at any time, without affecting my right to future care  or treatment, and that the Practitioner or I may terminate the telemedicine visit at any time. I understand that I have the right to inspect all information obtained and/or recorded in the course of the telemedicine visit and may receive copies of available information for a reasonable fee.  I understand that some of the potential risks of receiving the Services via telemedicine include:  Marland Kitchen Delay or interruption in medical evaluation due to technological equipment failure or disruption; . Information transmitted may not be sufficient (e.g. poor resolution of images) to allow for appropriate medical decision making by the Practitioner; and/or  . In rare instances, security protocols could fail, causing a breach of personal health information.  Furthermore, I acknowledge that it is my responsibility to provide information about my medical history, conditions and care that is complete and accurate to the best of my ability. I acknowledge that Practitioner's advice, recommendations, and/or decision may be based on factors not within their control, such as incomplete or inaccurate data provided by me or distortions of diagnostic images or specimens that may result from electronic transmissions. I understand that the practice  of medicine is not an Chief Strategy Officer and that Practitioner makes no warranties or guarantees regarding treatment outcomes. I acknowledge that I will receive a copy of this consent concurrently upon execution via email to the email address I last provided but may also request a printed copy by calling the office of [place].    I understand that my insurance will be billed for this visit.   I have read or had this consent read to me. . I understand the contents of this consent, which adequately explains the benefits and risks of the Services being provided via telemedicine.  . I have been provided ample opportunity to ask questions regarding this consent and the Services and have had  my questions answered to my satisfaction. . I give my informed consent for the services to be provided through the use of telemedicine in my medical care  By participating in this telemedicine visit I agree to the above.

## 2019-03-30 ENCOUNTER — Encounter: Payer: Self-pay | Admitting: Cardiovascular Disease

## 2019-03-30 ENCOUNTER — Telehealth: Payer: Self-pay | Admitting: Licensed Clinical Social Worker

## 2019-03-30 ENCOUNTER — Telehealth (INDEPENDENT_AMBULATORY_CARE_PROVIDER_SITE_OTHER): Payer: Medicare HMO | Admitting: Cardiovascular Disease

## 2019-03-30 VITALS — BP 142/68 | HR 69 | Ht 70.0 in | Wt 210.0 lb

## 2019-03-30 DIAGNOSIS — E785 Hyperlipidemia, unspecified: Secondary | ICD-10-CM | POA: Diagnosis not present

## 2019-03-30 DIAGNOSIS — I10 Essential (primary) hypertension: Secondary | ICD-10-CM

## 2019-03-30 DIAGNOSIS — I6523 Occlusion and stenosis of bilateral carotid arteries: Secondary | ICD-10-CM | POA: Diagnosis not present

## 2019-03-30 DIAGNOSIS — I25118 Atherosclerotic heart disease of native coronary artery with other forms of angina pectoris: Secondary | ICD-10-CM

## 2019-03-30 DIAGNOSIS — Z955 Presence of coronary angioplasty implant and graft: Secondary | ICD-10-CM

## 2019-03-30 MED ORDER — LISINOPRIL 10 MG PO TABS: 10.0000 mg | ORAL_TABLET | Freq: Every day | ORAL | 3 refills | Status: DC

## 2019-03-30 NOTE — Telephone Encounter (Signed)
CSW referred to assist patient with obtaining a BP cuff. CSW contacted patient to inform cuff will be delivered to home. Patient grateful for support and assistance. CSW available as needed. Jackie Leiliana Foody, LCSW, CCSW-MCS [phone]  

## 2019-03-30 NOTE — Addendum Note (Signed)
Addended by: Marlyn Corporal A on: 03/30/2019 11:47 AM   Modules accepted: Orders

## 2019-03-30 NOTE — Patient Instructions (Signed)
Medication Instructions: INCREASE Lisinopril to 10 mg daily    Labwork: None today  Procedures/Testing: Your physician has requested that you have a carotid duplex. This test is an ultrasound of the carotid arteries in your neck. It looks at blood flow through these arteries that supply the brain with blood. Allow one hour for this exam. There are no restrictions or special instructions.    Follow-Up: Office Visit 1 year with Dr.Koneswaran  Any Additional Special Instructions Will Be Listed Below (If Applicable).     If you need a refill on your cardiac medications before your next appointment, please call your pharmacy.        Thank you for choosing [facility] !

## 2019-03-30 NOTE — Progress Notes (Signed)
Virtual Visit via Telephone Note   This visit type was conducted due to national recommendations for restrictions regarding the COVID-19 Pandemic (e.g. social distancing) in an effort to limit this patient's exposure and mitigate transmission in our community.  Due to his co-morbid illnesses, this patient is at least at moderate risk for complications without adequate follow up.  This format is felt to be most appropriate for this patient at this time.  The patient did not have access to video technology/had technical difficulties with video requiring transitioning to audio format only (telephone).  All issues noted in this document were discussed and addressed.  No physical exam could be performed with this format.  Please refer to the patient's chart for his  consent to telehealth for [facility].   The patient was identified using 2 identifiers.  Date:  03/30/2019   ID:  Ronnie Gregory, DOB May 24, 1933, MRN [id number]  Patient Location: Home Provider Location: Office  PCP:  Glenda Chroman, MD  Cardiologist:  Kate Sable, MD  Electrophysiologist:  None   Evaluation Performed:  Follow-Up Visit  Chief Complaint:  CAD  History of Present Illness:    Ronnie Gregory is a 86 y.o. male with coronary disease with drug-eluting stent placement to the distal RCA and PDA in 2012.  Carotid Dopplers on 09/20/2015 demonstrated less than 50% bilateral internal carotid artery stenosis.  He denies chest pain and shortness of breath. He denies palpitations and leg swelling.  I spoke with his daughter, Freda Munro, as he is hard of hearing.  He resumed ASA 91 mg daily only 2 weeks ago.  He does not get much physical activity since the pandemic.   Past Medical History:  Diagnosis Date  . Arthritis   . Chronic kidney disease   . Constipation    uses stool softener daily  . GERD (gastroesophageal reflux disease)   . Hypercholesteremia   . Hypertension   . Hypothyroidism   .  Myocardial infarction [facility]) 2009   Dr. Cathren Harsh cardiologist 2 stents   Past Surgical History:  Procedure Laterality Date  . APPENDECTOMY    . BACK SURGERY     several   . CARDIAC CATHETERIZATION    . CORONARY ANGIOPLASTY     3 years ago  . LUMBAR LAMINECTOMY/ DECOMPRESSION WITH MET-RX N/A 03/05/2015   Procedure: Removal of posterior hardware Lumbar one with Metrex;  Surgeon: Kristeen Miss, MD;  Location: [place] [facility];  Service: Neurosurgery;  Laterality: N/A;  . SKIN SURGERY     skin cancer removed from forehead     Current Meds  Medication Sig  . aspirin EC 81 MG tablet Take 81 mg by mouth daily.  . CASCARA SAGRADA PO Take 1 tablet by mouth at bedtime. Stool softner  . HYDROcodone-acetaminophen (NORCO/VICODIN) 5-325 MG tablet Take 1-2 tablets by mouth every 6 (six) hours as needed for moderate pain.  Marland Kitchen levothyroxine (SYNTHROID, LEVOTHROID) 75 MCG tablet Take 75 mcg by mouth daily before breakfast.  . lisinopril (PRINIVIL,ZESTRIL) 5 MG tablet Take 5 mg by mouth daily.  . metoprolol tartrate (LOPRESSOR) 25 MG tablet Take 25 mg by mouth 2 (two) times daily.   . pravastatin (PRAVACHOL) 40 MG tablet Take 40 mg by mouth at bedtime.  Marland Kitchen tiZANidine (ZANAFLEX) 4 MG tablet Take 4 mg by mouth every 6 (six) hours as needed for muscle spasms.     Allergies:   Mobic [meloxicam]   Social History   Tobacco Use  . Smoking status:  Former Smoker    Start date: 01/20/1955    Quit date: 01/20/1960    Years since quitting: 59.2  . Smokeless tobacco: Never Used  Substance Use Topics  . Alcohol use: No    Alcohol/week: 0.0 standard drinks  . Drug use: No     Family Hx: The patient's family history includes Alzheimer's disease in his mother.  ROS:   Please see the history of present illness.     All other systems reviewed and are negative.   Prior CV studies:   The following studies were reviewed today:  Cardiac catheterization report from 04/29/2010 revealed a moderate sized LAD  with proximal calcification and mild diffuse nonobstructive disease up to 30%. The left circumflex coronary artery was moderate in size with no obstructive stenosis. The RCA was a large dominant vessel with a long subtotal 90% stenosis in the mid vessel stenosis and a focal distal subtotal 90% stenosis. There appeared to be competitive flow from left-sided collaterals. Left ventricular systolic function was reportedly normal with inferior hypokinesis.   An echocardiogram performed on 14/78/2956 showed LV systolic function to be normal to hyperdynamic with evidence of left ventricular hypertrophy and mild mitral and tricuspid regurgitation with no regional wall motion abnormalities.   Labs/Other Tests and Data Reviewed:    EKG:  No ECG reviewed.  Recent Labs: No results found for requested labs within last 8760 hours.   Recent Lipid Panel No results found for: CHOL, TRIG, HDL, CHOLHDL, LDLCALC, LDLDIRECT  Wt Readings from Last 3 Encounters:  03/30/19 210 lb (95.3 kg)  08/27/17 195 lb (88.5 kg)  05/28/17 211 lb (95.7 kg)     Objective:    Vital Signs:  BP (!) 142/68 Comment: normal per daughter  Pulse 69   Ht 5' 10"  (1.778 m)   Wt 210 lb (95.3 kg)   BMI 30.13 kg/m    VITAL SIGNS:  reviewed  ASSESSMENT & PLAN:    1.  Coronary artery disease: Symptomatically stable.  History of drug-eluting stent placement to the distal RCA and PDA in 2012.  Continue aspirin, metoprolol, and statin.  2.  Hypertension: Blood pressure is mildly elevated. It was 178/93 as per his daughter this past Monday. I will increase lisinopril to 10 mg daily.  3.  Hyperlipidemia: Continue statin therapy. I will obtain a copy of lipids from PCP.  4.  Bilateral carotid artery stenosis: Less than 50% bilateral ICA stenosis in 2017.  I will obtain follow-up Dopplers.    COVID-19 Education: The signs and symptoms of COVID-19 were discussed with the patient and how to seek care for testing (follow up with PCP or  arrange E-visit).  The importance of social distancing was discussed today.  Time:   Today, I have spent 20 minutes with the patient with telehealth technology discussing the above problems.     Medication Adjustments/Labs and Tests Ordered: Current medicines are reviewed at length with the patient today.  Concerns regarding medicines are outlined above.   Tests Ordered: No orders of the defined types were placed in this encounter.   Medication Changes: No orders of the defined types were placed in this encounter.   Follow Up:  1 yr  Signed, Kate Sable, MD  03/30/2019 11:22 AM    [place]

## 2019-04-04 ENCOUNTER — Ambulatory Visit (HOSPITAL_COMMUNITY): Admission: RE | Admit: 2019-04-04 | Payer: Medicare HMO | Source: Ambulatory Visit

## 2019-04-12 ENCOUNTER — Telehealth: Payer: Self-pay

## 2019-04-12 DIAGNOSIS — I25118 Atherosclerotic heart disease of native coronary artery with other forms of angina pectoris: Secondary | ICD-10-CM

## 2019-04-12 DIAGNOSIS — E785 Hyperlipidemia, unspecified: Secondary | ICD-10-CM

## 2019-04-12 NOTE — Telephone Encounter (Signed)
Labs from pcp too old, mailed patient lab slip for bmet and lipids

## 2019-05-17 DIAGNOSIS — Z1339 Encounter for screening examination for other mental health and behavioral disorders: Secondary | ICD-10-CM | POA: Diagnosis not present

## 2019-05-17 DIAGNOSIS — Z1211 Encounter for screening for malignant neoplasm of colon: Secondary | ICD-10-CM | POA: Diagnosis not present

## 2019-05-17 DIAGNOSIS — Z0000 Encounter for general adult medical examination without abnormal findings: Secondary | ICD-10-CM | POA: Diagnosis not present

## 2019-05-17 DIAGNOSIS — I10 Essential (primary) hypertension: Secondary | ICD-10-CM | POA: Diagnosis not present

## 2019-05-17 DIAGNOSIS — Z125 Encounter for screening for malignant neoplasm of prostate: Secondary | ICD-10-CM | POA: Diagnosis not present

## 2019-05-17 DIAGNOSIS — Z79899 Other long term (current) drug therapy: Secondary | ICD-10-CM | POA: Diagnosis not present

## 2019-05-17 DIAGNOSIS — Z6831 Body mass index (BMI) 31.0-31.9, adult: Secondary | ICD-10-CM | POA: Diagnosis not present

## 2019-05-17 DIAGNOSIS — E7800 Pure hypercholesterolemia, unspecified: Secondary | ICD-10-CM | POA: Diagnosis not present

## 2019-05-17 DIAGNOSIS — Z7189 Other specified counseling: Secondary | ICD-10-CM | POA: Diagnosis not present

## 2019-05-17 DIAGNOSIS — R5383 Other fatigue: Secondary | ICD-10-CM | POA: Diagnosis not present

## 2019-05-17 DIAGNOSIS — Z299 Encounter for prophylactic measures, unspecified: Secondary | ICD-10-CM | POA: Diagnosis not present

## 2019-05-17 DIAGNOSIS — E039 Hypothyroidism, unspecified: Secondary | ICD-10-CM | POA: Diagnosis not present

## 2019-05-17 DIAGNOSIS — Z1331 Encounter for screening for depression: Secondary | ICD-10-CM | POA: Diagnosis not present

## 2019-05-25 DIAGNOSIS — R972 Elevated prostate specific antigen [PSA]: Secondary | ICD-10-CM | POA: Diagnosis not present

## 2019-05-25 DIAGNOSIS — R739 Hyperglycemia, unspecified: Secondary | ICD-10-CM | POA: Diagnosis not present

## 2019-05-25 DIAGNOSIS — Z299 Encounter for prophylactic measures, unspecified: Secondary | ICD-10-CM | POA: Diagnosis not present

## 2019-05-25 DIAGNOSIS — N183 Chronic kidney disease, stage 3 unspecified: Secondary | ICD-10-CM | POA: Diagnosis not present

## 2019-05-25 DIAGNOSIS — E785 Hyperlipidemia, unspecified: Secondary | ICD-10-CM | POA: Diagnosis not present

## 2019-05-25 DIAGNOSIS — Z87891 Personal history of nicotine dependence: Secondary | ICD-10-CM | POA: Diagnosis not present

## 2019-06-01 DIAGNOSIS — E7800 Pure hypercholesterolemia, unspecified: Secondary | ICD-10-CM | POA: Diagnosis not present

## 2019-06-01 DIAGNOSIS — Z6831 Body mass index (BMI) 31.0-31.9, adult: Secondary | ICD-10-CM | POA: Diagnosis not present

## 2019-06-01 DIAGNOSIS — E039 Hypothyroidism, unspecified: Secondary | ICD-10-CM | POA: Diagnosis not present

## 2019-06-01 DIAGNOSIS — I10 Essential (primary) hypertension: Secondary | ICD-10-CM | POA: Diagnosis not present

## 2019-06-01 DIAGNOSIS — F322 Major depressive disorder, single episode, severe without psychotic features: Secondary | ICD-10-CM | POA: Diagnosis not present

## 2019-06-01 DIAGNOSIS — Z299 Encounter for prophylactic measures, unspecified: Secondary | ICD-10-CM | POA: Diagnosis not present

## 2019-06-01 DIAGNOSIS — Z1331 Encounter for screening for depression: Secondary | ICD-10-CM | POA: Diagnosis not present

## 2019-06-30 ENCOUNTER — Other Ambulatory Visit: Payer: Self-pay

## 2019-06-30 MED ORDER — LISINOPRIL 10 MG PO TABS: 10.0000 mg | ORAL_TABLET | Freq: Every day | ORAL | 0 refills | Status: DC

## 2019-06-30 NOTE — Telephone Encounter (Signed)
Refilled lisinopril

## 2019-07-04 ENCOUNTER — Telehealth: Payer: Self-pay

## 2019-07-04 MED ORDER — LISINOPRIL 10 MG PO TABS: 10.0000 mg | ORAL_TABLET | Freq: Every day | ORAL | 1 refills | Status: DC

## 2019-07-04 NOTE — Telephone Encounter (Signed)
Refilled lisinopril to [facility]

## 2019-07-19 DIAGNOSIS — E785 Hyperlipidemia, unspecified: Secondary | ICD-10-CM | POA: Diagnosis not present

## 2019-07-19 DIAGNOSIS — R42 Dizziness and giddiness: Secondary | ICD-10-CM | POA: Diagnosis not present

## 2019-09-19 DIAGNOSIS — E785 Hyperlipidemia, unspecified: Secondary | ICD-10-CM | POA: Diagnosis not present

## 2019-09-19 DIAGNOSIS — R42 Dizziness and giddiness: Secondary | ICD-10-CM | POA: Diagnosis not present

## 2019-10-16 DIAGNOSIS — K429 Umbilical hernia without obstruction or gangrene: Secondary | ICD-10-CM | POA: Diagnosis not present

## 2019-10-16 DIAGNOSIS — Z885 Allergy status to narcotic agent status: Secondary | ICD-10-CM | POA: Diagnosis not present

## 2019-10-16 DIAGNOSIS — E079 Disorder of thyroid, unspecified: Secondary | ICD-10-CM | POA: Diagnosis not present

## 2019-10-16 DIAGNOSIS — N200 Calculus of kidney: Secondary | ICD-10-CM | POA: Diagnosis not present

## 2019-10-16 DIAGNOSIS — R112 Nausea with vomiting, unspecified: Secondary | ICD-10-CM | POA: Diagnosis not present

## 2019-10-16 DIAGNOSIS — K529 Noninfective gastroenteritis and colitis, unspecified: Secondary | ICD-10-CM | POA: Diagnosis not present

## 2019-10-16 DIAGNOSIS — R1032 Left lower quadrant pain: Secondary | ICD-10-CM | POA: Diagnosis not present

## 2019-10-16 DIAGNOSIS — R1111 Vomiting without nausea: Secondary | ICD-10-CM | POA: Diagnosis not present

## 2019-10-16 DIAGNOSIS — Z888 Allergy status to other drugs, medicaments and biological substances status: Secondary | ICD-10-CM | POA: Diagnosis not present

## 2019-10-16 DIAGNOSIS — Z7982 Long term (current) use of aspirin: Secondary | ICD-10-CM | POA: Diagnosis not present

## 2019-10-16 DIAGNOSIS — I10 Essential (primary) hypertension: Secondary | ICD-10-CM | POA: Diagnosis not present

## 2019-10-16 DIAGNOSIS — K4090 Unilateral inguinal hernia, without obstruction or gangrene, not specified as recurrent: Secondary | ICD-10-CM | POA: Diagnosis not present

## 2019-10-16 DIAGNOSIS — E785 Hyperlipidemia, unspecified: Secondary | ICD-10-CM | POA: Diagnosis not present

## 2019-10-16 DIAGNOSIS — R197 Diarrhea, unspecified: Secondary | ICD-10-CM | POA: Diagnosis not present

## 2019-11-17 DIAGNOSIS — R42 Dizziness and giddiness: Secondary | ICD-10-CM | POA: Diagnosis not present

## 2019-11-17 DIAGNOSIS — E785 Hyperlipidemia, unspecified: Secondary | ICD-10-CM | POA: Diagnosis not present

## 2020-01-09 DIAGNOSIS — N189 Chronic kidney disease, unspecified: Secondary | ICD-10-CM | POA: Diagnosis not present

## 2020-01-09 DIAGNOSIS — J9601 Acute respiratory failure with hypoxia: Secondary | ICD-10-CM | POA: Diagnosis not present

## 2020-01-09 DIAGNOSIS — F0390 Unspecified dementia without behavioral disturbance: Secondary | ICD-10-CM | POA: Diagnosis not present

## 2020-01-09 DIAGNOSIS — E785 Hyperlipidemia, unspecified: Secondary | ICD-10-CM | POA: Diagnosis not present

## 2020-01-09 DIAGNOSIS — R0902 Hypoxemia: Secondary | ICD-10-CM | POA: Diagnosis not present

## 2020-01-09 DIAGNOSIS — R110 Nausea: Secondary | ICD-10-CM | POA: Diagnosis not present

## 2020-01-09 DIAGNOSIS — F05 Delirium due to known physiological condition: Secondary | ICD-10-CM | POA: Diagnosis not present

## 2020-01-09 DIAGNOSIS — R4182 Altered mental status, unspecified: Secondary | ICD-10-CM | POA: Diagnosis not present

## 2020-01-09 DIAGNOSIS — U071 COVID-19: Secondary | ICD-10-CM | POA: Diagnosis not present

## 2020-01-09 DIAGNOSIS — G9009 Other idiopathic peripheral autonomic neuropathy: Secondary | ICD-10-CM | POA: Diagnosis not present

## 2020-01-09 DIAGNOSIS — G629 Polyneuropathy, unspecified: Secondary | ICD-10-CM | POA: Diagnosis not present

## 2020-01-09 DIAGNOSIS — R42 Dizziness and giddiness: Secondary | ICD-10-CM | POA: Diagnosis not present

## 2020-01-09 DIAGNOSIS — I10 Essential (primary) hypertension: Secondary | ICD-10-CM | POA: Diagnosis not present

## 2020-01-09 DIAGNOSIS — E1122 Type 2 diabetes mellitus with diabetic chronic kidney disease: Secondary | ICD-10-CM | POA: Diagnosis not present

## 2020-01-09 DIAGNOSIS — G609 Hereditary and idiopathic neuropathy, unspecified: Secondary | ICD-10-CM | POA: Diagnosis not present

## 2020-01-09 DIAGNOSIS — R0689 Other abnormalities of breathing: Secondary | ICD-10-CM | POA: Diagnosis not present

## 2020-01-09 DIAGNOSIS — R410 Disorientation, unspecified: Secondary | ICD-10-CM | POA: Diagnosis not present

## 2020-01-09 DIAGNOSIS — E119 Type 2 diabetes mellitus without complications: Secondary | ICD-10-CM | POA: Diagnosis not present

## 2020-01-09 DIAGNOSIS — J1282 Pneumonia due to coronavirus disease 2019: Secondary | ICD-10-CM | POA: Diagnosis not present

## 2020-01-09 DIAGNOSIS — E1142 Type 2 diabetes mellitus with diabetic polyneuropathy: Secondary | ICD-10-CM | POA: Diagnosis not present

## 2020-01-09 DIAGNOSIS — I2510 Atherosclerotic heart disease of native coronary artery without angina pectoris: Secondary | ICD-10-CM | POA: Diagnosis not present

## 2020-01-09 DIAGNOSIS — E079 Disorder of thyroid, unspecified: Secondary | ICD-10-CM | POA: Diagnosis not present

## 2020-01-09 DIAGNOSIS — N183 Chronic kidney disease, stage 3 unspecified: Secondary | ICD-10-CM | POA: Diagnosis not present

## 2020-01-09 DIAGNOSIS — R059 Cough, unspecified: Secondary | ICD-10-CM | POA: Diagnosis not present

## 2020-01-10 DIAGNOSIS — U071 COVID-19: Secondary | ICD-10-CM | POA: Diagnosis not present

## 2020-01-10 DIAGNOSIS — G629 Polyneuropathy, unspecified: Secondary | ICD-10-CM | POA: Diagnosis not present

## 2020-01-10 DIAGNOSIS — N183 Chronic kidney disease, stage 3 unspecified: Secondary | ICD-10-CM | POA: Diagnosis not present

## 2020-01-10 DIAGNOSIS — J1282 Pneumonia due to coronavirus disease 2019: Secondary | ICD-10-CM | POA: Diagnosis not present

## 2020-01-11 DIAGNOSIS — J1282 Pneumonia due to coronavirus disease 2019: Secondary | ICD-10-CM | POA: Diagnosis not present

## 2020-01-11 DIAGNOSIS — U071 COVID-19: Secondary | ICD-10-CM | POA: Diagnosis not present

## 2020-01-15 DIAGNOSIS — G609 Hereditary and idiopathic neuropathy, unspecified: Secondary | ICD-10-CM | POA: Diagnosis not present

## 2020-01-15 DIAGNOSIS — U071 COVID-19: Secondary | ICD-10-CM | POA: Diagnosis not present

## 2020-01-15 DIAGNOSIS — J1282 Pneumonia due to coronavirus disease 2019: Secondary | ICD-10-CM | POA: Diagnosis not present

## 2020-01-15 DIAGNOSIS — I10 Essential (primary) hypertension: Secondary | ICD-10-CM | POA: Diagnosis not present

## 2020-01-15 DIAGNOSIS — N189 Chronic kidney disease, unspecified: Secondary | ICD-10-CM | POA: Diagnosis not present

## 2020-01-16 DIAGNOSIS — R410 Disorientation, unspecified: Secondary | ICD-10-CM | POA: Diagnosis not present

## 2020-01-16 DIAGNOSIS — I10 Essential (primary) hypertension: Secondary | ICD-10-CM | POA: Diagnosis not present

## 2020-01-16 DIAGNOSIS — U071 COVID-19: Secondary | ICD-10-CM | POA: Diagnosis not present

## 2020-01-16 DIAGNOSIS — N189 Chronic kidney disease, unspecified: Secondary | ICD-10-CM | POA: Diagnosis not present

## 2020-01-16 DIAGNOSIS — J1282 Pneumonia due to coronavirus disease 2019: Secondary | ICD-10-CM | POA: Diagnosis not present

## 2020-01-17 DIAGNOSIS — J1282 Pneumonia due to coronavirus disease 2019: Secondary | ICD-10-CM | POA: Diagnosis not present

## 2020-01-17 DIAGNOSIS — U071 COVID-19: Secondary | ICD-10-CM | POA: Diagnosis not present

## 2020-01-18 DIAGNOSIS — R4182 Altered mental status, unspecified: Secondary | ICD-10-CM | POA: Diagnosis not present

## 2020-01-19 DIAGNOSIS — R42 Dizziness and giddiness: Secondary | ICD-10-CM | POA: Diagnosis not present

## 2020-01-19 DIAGNOSIS — E785 Hyperlipidemia, unspecified: Secondary | ICD-10-CM | POA: Diagnosis not present

## 2020-01-25 ENCOUNTER — Other Ambulatory Visit: Payer: Self-pay

## 2020-01-25 NOTE — Patient Outreach (Signed)
[facility]) Care Management  01/25/2020  Ronnie Gregory Jun 25, 1933 [id number]   Referral Date: 01/25/20 Referral Source: [employer] Report Date of Discharge: 01/23/20 Facility:  [facility] Insurance: [facility]   Referral received.  No outreach warranted at this time.  Patient discharged to a SNF.    Plan: RN CM will close case.    Bary Leriche, RN, MSN [facility] Care Management Care Management Coordinator Direct Line [phone] Toll Free: [phone]  Fax: [phone]

## 2020-01-31 ENCOUNTER — Other Ambulatory Visit: Payer: Self-pay

## 2020-01-31 ENCOUNTER — Emergency Department (HOSPITAL_COMMUNITY): Payer: Medicare Other

## 2020-01-31 ENCOUNTER — Inpatient Hospital Stay (HOSPITAL_COMMUNITY)
Admission: EM | Admit: 2020-01-31 | Discharge: 2020-02-05 | DRG: 871 | Disposition: A | Discharge status: Skilled Nursing Facility | Payer: Medicare Other | Attending: Family Medicine | Admitting: Family Medicine

## 2020-01-31 ENCOUNTER — Encounter (HOSPITAL_COMMUNITY): Payer: Self-pay

## 2020-01-31 DIAGNOSIS — N179 Acute kidney failure, unspecified: Secondary | ICD-10-CM | POA: Diagnosis present

## 2020-01-31 DIAGNOSIS — H9190 Unspecified hearing loss, unspecified ear: Secondary | ICD-10-CM | POA: Diagnosis present

## 2020-01-31 DIAGNOSIS — E039 Hypothyroidism, unspecified: Secondary | ICD-10-CM | POA: Diagnosis present

## 2020-01-31 DIAGNOSIS — Z85828 Personal history of other malignant neoplasm of skin: Secondary | ICD-10-CM | POA: Diagnosis not present

## 2020-01-31 DIAGNOSIS — G9341 Metabolic encephalopathy: Secondary | ICD-10-CM | POA: Diagnosis present

## 2020-01-31 DIAGNOSIS — I252 Old myocardial infarction: Secondary | ICD-10-CM

## 2020-01-31 DIAGNOSIS — E1122 Type 2 diabetes mellitus with diabetic chronic kidney disease: Secondary | ICD-10-CM | POA: Diagnosis present

## 2020-01-31 DIAGNOSIS — I959 Hypotension, unspecified: Secondary | ICD-10-CM

## 2020-01-31 DIAGNOSIS — N390 Urinary tract infection, site not specified: Secondary | ICD-10-CM | POA: Diagnosis present

## 2020-01-31 DIAGNOSIS — R4182 Altered mental status, unspecified: Secondary | ICD-10-CM | POA: Diagnosis present

## 2020-01-31 DIAGNOSIS — Z7982 Long term (current) use of aspirin: Secondary | ICD-10-CM | POA: Diagnosis not present

## 2020-01-31 DIAGNOSIS — I129 Hypertensive chronic kidney disease with stage 1 through stage 4 chronic kidney disease, or unspecified chronic kidney disease: Secondary | ICD-10-CM | POA: Diagnosis present

## 2020-01-31 DIAGNOSIS — D509 Iron deficiency anemia, unspecified: Secondary | ICD-10-CM | POA: Diagnosis present

## 2020-01-31 DIAGNOSIS — A419 Sepsis, unspecified organism: Secondary | ICD-10-CM | POA: Diagnosis present

## 2020-01-31 DIAGNOSIS — E7800 Pure hypercholesterolemia, unspecified: Secondary | ICD-10-CM | POA: Diagnosis present

## 2020-01-31 DIAGNOSIS — Z886 Allergy status to analgesic agent status: Secondary | ICD-10-CM

## 2020-01-31 DIAGNOSIS — Z955 Presence of coronary angioplasty implant and graft: Secondary | ICD-10-CM

## 2020-01-31 DIAGNOSIS — M1990 Unspecified osteoarthritis, unspecified site: Secondary | ICD-10-CM | POA: Diagnosis present

## 2020-01-31 DIAGNOSIS — R6510 Systemic inflammatory response syndrome (SIRS) of non-infectious origin without acute organ dysfunction: Secondary | ICD-10-CM | POA: Diagnosis not present

## 2020-01-31 DIAGNOSIS — Z888 Allergy status to other drugs, medicaments and biological substances status: Secondary | ICD-10-CM

## 2020-01-31 DIAGNOSIS — I2510 Atherosclerotic heart disease of native coronary artery without angina pectoris: Secondary | ICD-10-CM | POA: Diagnosis present

## 2020-01-31 DIAGNOSIS — N1832 Chronic kidney disease, stage 3b: Secondary | ICD-10-CM | POA: Diagnosis present

## 2020-01-31 DIAGNOSIS — Z8616 Personal history of COVID-19: Secondary | ICD-10-CM | POA: Diagnosis not present

## 2020-01-31 DIAGNOSIS — R0902 Hypoxemia: Secondary | ICD-10-CM

## 2020-01-31 DIAGNOSIS — E860 Dehydration: Secondary | ICD-10-CM | POA: Diagnosis present

## 2020-01-31 DIAGNOSIS — K219 Gastro-esophageal reflux disease without esophagitis: Secondary | ICD-10-CM | POA: Diagnosis present

## 2020-01-31 DIAGNOSIS — U071 COVID-19: Secondary | ICD-10-CM | POA: Diagnosis present

## 2020-01-31 DIAGNOSIS — Z820 Family history of epilepsy and other diseases of the nervous system: Secondary | ICD-10-CM

## 2020-01-31 DIAGNOSIS — Z79890 Hormone replacement therapy (postmenopausal): Secondary | ICD-10-CM

## 2020-01-31 DIAGNOSIS — Z20822 Contact with and (suspected) exposure to covid-19: Secondary | ICD-10-CM | POA: Diagnosis present

## 2020-01-31 DIAGNOSIS — Z87891 Personal history of nicotine dependence: Secondary | ICD-10-CM | POA: Diagnosis not present

## 2020-01-31 DIAGNOSIS — Z79899 Other long term (current) drug therapy: Secondary | ICD-10-CM

## 2020-01-31 LAB — CBG MONITORING, ED
Glucose-Capillary: 168 mg/dL — ABNORMAL HIGH (ref 70–99)
Glucose-Capillary: 120 mg/dL — ABNORMAL HIGH (ref 70–99)
Glucose-Capillary: 115 mg/dL — ABNORMAL HIGH (ref 70–99)

## 2020-01-31 LAB — BLOOD GAS, ARTERIAL
Acid-base deficit: 4.8 mmol/L — ABNORMAL HIGH (ref 0.0–2.0)
Bicarbonate: 20.6 mmol/L (ref 20.0–28.0)
FIO2: 80
O2 Saturation: 99.4 %
Patient temperature: 37
pCO2 arterial: 31 mmHg — ABNORMAL LOW (ref 32.0–48.0)
pH, Arterial: 7.405 (ref 7.350–7.450)
pO2, Arterial: 276 mmHg — ABNORMAL HIGH (ref 83.0–108.0)

## 2020-01-31 LAB — CBC WITH DIFFERENTIAL/PLATELET
Abs Immature Granulocytes: 0.13 K/uL — ABNORMAL HIGH (ref 0.00–0.07)
Basophils Absolute: 0 K/uL (ref 0.0–0.1)
Basophils Relative: 0 %
Eosinophils Absolute: 0.2 K/uL (ref 0.0–0.5)
Eosinophils Relative: 1 %
HCT: 32.9 % — ABNORMAL LOW (ref 39.0–52.0)
Hemoglobin: 10.5 g/dL — ABNORMAL LOW (ref 13.0–17.0)
Immature Granulocytes: 1 %
Lymphocytes Relative: 9 %
Lymphs Abs: 1.7 K/uL (ref 0.7–4.0)
MCH: 31.7 pg (ref 26.0–34.0)
MCHC: 31.9 g/dL (ref 30.0–36.0)
MCV: 99.4 fL (ref 80.0–100.0)
Monocytes Absolute: 1.2 K/uL — ABNORMAL HIGH (ref 0.1–1.0)
Monocytes Relative: 7 %
Neutro Abs: 15 K/uL — ABNORMAL HIGH (ref 1.7–7.7)
Neutrophils Relative %: 82 %
Platelets: 479 K/uL — ABNORMAL HIGH (ref 150–400)
RBC: 3.31 MIL/uL — ABNORMAL LOW (ref 4.22–5.81)
RDW: 13.6 % (ref 11.5–15.5)
WBC: 18.2 K/uL — ABNORMAL HIGH (ref 4.0–10.5)
nRBC: 0 % (ref 0.0–0.2)

## 2020-01-31 LAB — URINALYSIS, ROUTINE W REFLEX MICROSCOPIC
Bilirubin Urine: NEGATIVE
Glucose, UA: NEGATIVE mg/dL
Ketones, ur: NEGATIVE mg/dL
Nitrite: NEGATIVE
Protein, ur: 30 mg/dL — AB
RBC / HPF: >50 RBC/hpf — ABNORMAL HIGH (ref 0–5)
Specific Gravity, Urine: 1.017 (ref 1.005–1.030)
WBC, UA: >50 WBC/hpf — ABNORMAL HIGH (ref 0–5)
pH: 5 (ref 5.0–8.0)

## 2020-01-31 LAB — COMPREHENSIVE METABOLIC PANEL WITH GFR
ALT: 58 U/L — ABNORMAL HIGH (ref 0–44)
AST: 41 U/L (ref 15–41)
Albumin: 2.9 g/dL — ABNORMAL LOW (ref 3.5–5.0)
Alkaline Phosphatase: 101 U/L (ref 38–126)
Anion gap: 14 (ref 5–15)
BUN: 63 mg/dL — ABNORMAL HIGH (ref 8–23)
CO2: 18 mmol/L — ABNORMAL LOW (ref 22–32)
Calcium: 8.9 mg/dL (ref 8.9–10.3)
Chloride: 101 mmol/L (ref 98–111)
Creatinine, Ser: 2.52 mg/dL — ABNORMAL HIGH (ref 0.61–1.24)
GFR, Estimated: 24 mL/min — ABNORMAL LOW
Glucose, Bld: 202 mg/dL — ABNORMAL HIGH (ref 70–99)
Potassium: 4 mmol/L (ref 3.5–5.1)
Sodium: 133 mmol/L — ABNORMAL LOW (ref 135–145)
Total Bilirubin: 1.1 mg/dL (ref 0.3–1.2)
Total Protein: 6.9 g/dL (ref 6.5–8.1)

## 2020-01-31 LAB — PROTIME-INR
INR: 1.6 — ABNORMAL HIGH (ref 0.8–1.2)
Prothrombin Time: 18.3 seconds — ABNORMAL HIGH (ref 11.4–15.2)

## 2020-01-31 LAB — LACTIC ACID, PLASMA
Lactic Acid, Venous: 2.3 mmol/L (ref 0.5–1.9)
Lactic Acid, Venous: 1.7 mmol/L (ref 0.5–1.9)

## 2020-01-31 LAB — RESP PANEL BY RT-PCR (FLU A&B, COVID) ARPGX2
Influenza A by PCR: NEGATIVE
Influenza B by PCR: NEGATIVE
SARS Coronavirus 2 by RT PCR: NEGATIVE

## 2020-01-31 LAB — IRON AND TIBC
Iron: 25 ug/dL — ABNORMAL LOW (ref 45–182)
Saturation Ratios: 11 % — ABNORMAL LOW (ref 17.9–39.5)
TIBC: 221 ug/dL — ABNORMAL LOW (ref 250–450)
UIBC: 196 ug/dL

## 2020-01-31 LAB — FERRITIN: Ferritin: 859 ng/mL — ABNORMAL HIGH (ref 24–336)

## 2020-01-31 LAB — VITAMIN B12: Vitamin B-12: 218 pg/mL (ref 180–914)

## 2020-01-31 LAB — FOLATE: Folate: 11.5 ng/mL (ref >5.9)

## 2020-01-31 LAB — APTT: aPTT: 42 s — ABNORMAL HIGH (ref 24–36)

## 2020-01-31 MED ORDER — ONDANSETRON HCL 4 MG/2ML IJ SOLN: 4.0000 mg | Freq: Four times a day (QID) | INTRAMUSCULAR | Status: DC | PRN

## 2020-01-31 MED ORDER — HYDROCODONE-ACETAMINOPHEN 5-325 MG PO TABS
1.0000 | ORAL_TABLET | Freq: Three times a day (TID) | ORAL | Status: DC | PRN
  Administered 2020-02-02 – 2020-02-04 (×2): 1 via ORAL
  Filled 2020-01-31 (×2): qty 1

## 2020-01-31 MED ORDER — SODIUM CHLORIDE 0.9 % IV SOLN
2.0000 g | INTRAVENOUS | Status: DC
  Administered 2020-01-31: 2 g via INTRAVENOUS
  Filled 2020-01-31: qty 2

## 2020-01-31 MED ORDER — APIXABAN 2.5 MG PO TABS
2.5000 mg | ORAL_TABLET | Freq: Two times a day (BID) | ORAL | Status: DC
  Administered 2020-01-31 – 2020-02-05 (×10): 2.5 mg via ORAL
  Filled 2020-01-31 (×10): qty 1

## 2020-01-31 MED ORDER — VANCOMYCIN HCL IN DEXTROSE 1-5 GM/200ML-% IV SOLN
1000.0000 mg | Freq: Once | INTRAVENOUS | Status: AC
  Administered 2020-01-31: 1000 mg via INTRAVENOUS
  Filled 2020-01-31: qty 200

## 2020-01-31 MED ORDER — LACTATED RINGERS IV BOLUS (SEPSIS)
1000.0000 mL | Freq: Once | INTRAVENOUS | Status: AC
  Administered 2020-01-31: 1000 mL via INTRAVENOUS

## 2020-01-31 MED ORDER — PANTOPRAZOLE SODIUM 40 MG IV SOLR
40.0000 mg | INTRAVENOUS | Status: DC
  Administered 2020-01-31 – 2020-02-04 (×5): 40 mg via INTRAVENOUS
  Filled 2020-01-31 (×5): qty 40

## 2020-01-31 MED ORDER — LACTATED RINGERS IV SOLN: INTRAVENOUS | Status: AC

## 2020-01-31 MED ORDER — INSULIN ASPART 100 UNIT/ML ~~LOC~~ SOLN: 0.0000 [IU] | Freq: Three times a day (TID) | SUBCUTANEOUS | Status: DC

## 2020-01-31 MED ORDER — SODIUM CHLORIDE 0.9% FLUSH: 3.0000 mL | INTRAVENOUS | Status: DC | PRN

## 2020-01-31 MED ORDER — ACETAMINOPHEN 650 MG RE SUPP: 650.0000 mg | Freq: Four times a day (QID) | RECTAL | Status: DC | PRN

## 2020-01-31 MED ORDER — SODIUM CHLORIDE 0.9% FLUSH
3.0000 mL | Freq: Two times a day (BID) | INTRAVENOUS | Status: DC
  Administered 2020-02-01 – 2020-02-05 (×7): 3 mL via INTRAVENOUS

## 2020-01-31 MED ORDER — BISACODYL 10 MG RE SUPP: 10.0000 mg | Freq: Every day | RECTAL | Status: DC | PRN

## 2020-01-31 MED ORDER — POLYETHYLENE GLYCOL 3350 17 G PO PACK: 17.0000 g | PACK | Freq: Every day | ORAL | Status: DC | PRN

## 2020-01-31 MED ORDER — ONDANSETRON HCL 4 MG PO TABS: 4.0000 mg | ORAL_TABLET | Freq: Four times a day (QID) | ORAL | Status: DC | PRN

## 2020-01-31 MED ORDER — SODIUM CHLORIDE 0.9 % IV SOLN: 250.0000 mL | INTRAVENOUS | Status: DC | PRN

## 2020-01-31 MED ORDER — LEVOTHYROXINE SODIUM 75 MCG PO TABS
75.0000 ug | ORAL_TABLET | Freq: Every day | ORAL | Status: DC
  Administered 2020-02-01 – 2020-02-03 (×3): 75 ug via ORAL
  Filled 2020-01-31 (×2): qty 1
  Filled 2020-01-31: qty 2
  Filled 2020-01-31: qty 1

## 2020-01-31 MED ORDER — PRAVASTATIN SODIUM 40 MG PO TABS
40.0000 mg | ORAL_TABLET | Freq: Every day | ORAL | Status: DC
  Administered 2020-01-31 – 2020-02-04 (×5): 40 mg via ORAL
  Filled 2020-01-31 (×5): qty 1

## 2020-01-31 MED ORDER — SODIUM CHLORIDE 0.9% FLUSH
3.0000 mL | Freq: Two times a day (BID) | INTRAVENOUS | Status: DC
  Administered 2020-01-31 – 2020-02-05 (×9): 3 mL via INTRAVENOUS

## 2020-01-31 MED ORDER — INSULIN ASPART 100 UNIT/ML ~~LOC~~ SOLN: 0.0000 [IU] | Freq: Every day | SUBCUTANEOUS | Status: DC

## 2020-01-31 MED ORDER — PIPERACILLIN-TAZOBACTAM 3.375 G IVPB 30 MIN
3.3750 g | Freq: Once | INTRAVENOUS | Status: AC
  Administered 2020-01-31: 3.375 g via INTRAVENOUS
  Filled 2020-01-31 (×2): qty 50

## 2020-01-31 MED ORDER — ACETAMINOPHEN 325 MG PO TABS: 650.0000 mg | ORAL_TABLET | Freq: Four times a day (QID) | ORAL | Status: DC | PRN

## 2020-01-31 NOTE — ED Provider Notes (Signed)
[facility] EMERGENCY DEPARTMENT Provider Note   CSN: [id number] Arrival date & time: 01/31/20  1113     History Chief Complaint  Patient presents with  . Altered Mental Status    Ronnie Gregory is a 87 y.o. male.  Patient presents the ER chief complaint of increased confusion and decreased activities.  He was diagnosed with COVID approximately 2 to 3 weeks ago at an outside hospital.  He was admitted for 2 weeks at that hospital and then transferred to a rehab facility where he was for several days before being pulled out by his daughter 3 days ago.  He has been home with his daughter for the past 3 days.  She states that his condition is worsening while he was in rehab he is less responsive more confused.  She states that he typically lives alone and is very functional alone until his recent admissions.  Complete review of systems history difficult obtain for the patient himself as he is extremely hard of hearing and appears confused today.        Past Medical History:  Diagnosis Date  . Arthritis   . Chronic kidney disease   . Constipation    uses stool softener daily  . GERD (gastroesophageal reflux disease)   . Hypercholesteremia   . Hypertension   . Hypothyroidism   . Myocardial infarction [facility]) 2009   Dr. Cathren Harsh cardiologist 2 stents    Patient Active Problem List   Diagnosis Date Noted  . SIRS (systemic inflammatory response syndrome) ([place]) 01/31/2020  . CRI (chronic renal insufficiency), stage 3 (moderate) ([place]) 05/28/2017  . Carotid artery disease ([place]) 05/28/2017  . Pseudoarthrosis of lumbar spine 03/05/2015  . Spinal stenosis, lumbar region, with neurogenic claudication 10/16/2014  . CAD (coronary artery disease) 12/02/2012  . S/P coronary artery stent placement 12/02/2012    Past Surgical History:  Procedure Laterality Date  . APPENDECTOMY    . BACK SURGERY     several   . CARDIAC CATHETERIZATION    . CORONARY ANGIOPLASTY     3 years  ago  . LUMBAR LAMINECTOMY/ DECOMPRESSION WITH MET-RX N/A 03/05/2015   Procedure: Removal of posterior hardware Lumbar one with Metrex;  Surgeon: Kristeen Miss, MD;  Location: [place] [facility];  Service: Neurosurgery;  Laterality: N/A;  . SKIN SURGERY     skin cancer removed from forehead       Family History  Problem Relation Age of Onset  . Alzheimer's disease Mother     Social History   Tobacco Use  . Smoking status: Former Smoker    Start date: 01/20/1955    Quit date: 01/20/1960    Years since quitting: 60.0  . Smokeless tobacco: Never Used  Vaping Use  . Vaping Use: Never used  Substance Use Topics  . Alcohol use: No    Alcohol/week: 0.0 standard drinks  . Drug use: No    Home Medications Prior to Admission medications   Medication Sig Start Date End Date Taking? Authorizing Provider  aspirin EC 81 MG tablet Take 81 mg by mouth daily.    [provider]  CASCARA SAGRADA PO Take 1 tablet by mouth at bedtime. Stool softner    [provider]  HYDROcodone-acetaminophen (NORCO/VICODIN) 5-325 MG tablet Take 1-2 tablets by mouth every 6 (six) hours as needed for moderate pain.    [provider]  levothyroxine (SYNTHROID, LEVOTHROID) 75 MCG tablet Take 75 mcg by mouth daily before breakfast.    [provider]  lisinopril (ZESTRIL) 10 MG tablet Take 1 tablet (10 mg total) by mouth daily. 07/04/19   Herminio Commons, MD  metoprolol tartrate (LOPRESSOR) 25 MG tablet Take 25 mg by mouth 2 (two) times daily.     [provider]  pravastatin (PRAVACHOL) 40 MG tablet Take 40 mg by mouth at bedtime.    [provider]  tiZANidine (ZANAFLEX) 4 MG tablet Take 4 mg by mouth every 6 (six) hours as needed for muscle spasms.    [provider]    Allergies    Hydrocodone and Mobic [meloxicam]  Review of Systems   Review of Systems  Unable to perform ROS: Mental status change    Physical Exam Updated Vital Signs BP (!)  92/53 (BP Location: Left Arm)   Pulse 72   Temp 98.5 F (36.9 C) (Rectal)   Resp (!) 28   Ht 5' 8" (1.727 m)   Wt 90.7 kg   SpO2 100%   BMI 30.41 kg/m   Physical Exam Constitutional:      Appearance: He is well-developed.     Comments: Patient awake, looking around but confused.  HENT:     Head: Normocephalic.     Nose: Nose normal.  Eyes:     Extraocular Movements: Extraocular movements intact.  Cardiovascular:     Rate and Rhythm: Normal rate.  Pulmonary:     Effort: Respiratory distress present.  Skin:    Coloration: Skin is not jaundiced.  Neurological:     Comments: Appears to be moving all extremities but confused as to location and time.  Patient is otherwise verbal and does try to answer my questions.     ED Results / Procedures / Treatments   Labs (all labs ordered are listed, but only abnormal results are displayed) Labs Reviewed  LACTIC ACID, PLASMA - Abnormal; Notable for the following components:      Result Value   Lactic Acid, Venous 2.3 (*)    All other components within normal limits  COMPREHENSIVE METABOLIC PANEL - Abnormal; Notable for the following components:   Sodium 133 (*)    CO2 18 (*)    Glucose, Bld 202 (*)    BUN 63 (*)    Creatinine, Ser 2.52 (*)    Albumin 2.9 (*)    ALT 58 (*)    GFR, Estimated 24 (*)    All other components within normal limits  CBC WITH DIFFERENTIAL/PLATELET - Abnormal; Notable for the following components:   WBC 18.2 (*)    RBC 3.31 (*)    Hemoglobin 10.5 (*)    HCT 32.9 (*)    Platelets 479 (*)    Neutro Abs 15.0 (*)    Monocytes Absolute 1.2 (*)    Abs Immature Granulocytes 0.13 (*)    All other components within normal limits  PROTIME-INR - Abnormal; Notable for the following components:   Prothrombin Time 18.3 (*)    INR 1.6 (*)    All other components within normal limits  APTT - Abnormal; Notable for the following components:   aPTT 42 (*)    All other components within normal limits   URINALYSIS, ROUTINE W REFLEX MICROSCOPIC - Abnormal; Notable for the following components:   APPearance CLOUDY (*)    Hgb urine dipstick LARGE (*)    Protein, ur 30 (*)    Leukocytes,Ua MODERATE (*)    RBC / HPF >50 (*)    WBC, UA >50 (*)    Bacteria,  UA RARE (*)    All other components within normal limits  BLOOD GAS, ARTERIAL - Abnormal; Notable for the following components:   pCO2 arterial 31.0 (*)    pO2, Arterial 276 (*)    Acid-base deficit 4.8 (*)    All other components within normal limits  CBG MONITORING, ED - Abnormal; Notable for the following components:   Glucose-Capillary 168 (*)    All other components within normal limits  RESP PANEL BY RT-PCR (FLU A&B, COVID) ARPGX2  CULTURE, BLOOD (ROUTINE X 2)  CULTURE, BLOOD (ROUTINE X 2)  URINE CULTURE  LACTIC ACID, PLASMA    EKG None  Radiology CT Head Wo Contrast  Result Date: 01/31/2020 CLINICAL DATA:  Altered mental status EXAM: CT HEAD WITHOUT CONTRAST TECHNIQUE: Contiguous axial images were obtained from the base of the skull through the vertex without intravenous contrast. COMPARISON:  01/09/2020 FINDINGS: Brain: There is no acute intracranial hemorrhage, mass effect, or edema. Gray-white differentiation is preserved. There is no extra-axial fluid collection. Prominence of the ventricles and sulci reflects stable generalized parenchymal volume loss. Patchy and confluent areas of hypoattenuation in the supratentorial white matter nonspecific but probably reflects stable advanced chronic microvascular ischemic changes. Vascular: There is atherosclerotic calcification at the skull base. Skull: Calvarium is unremarkable. Sinuses/Orbits: Mild mucosal thickening.  Orbits are unremarkable. Other: None. IMPRESSION: No acute intracranial hemorrhage, mass effect, or evidence of acute infarction. Advanced chronic microvascular ischemic changes. Electronically Signed   By: Macy Mis M.D.   On: 01/31/2020 13:59   DG Chest Port 1  View  Result Date: 01/31/2020 CLINICAL DATA:  AMS/Spesis/htn/hx MI/ex smoker/hx heart cath Covid + 12/2019-retested 2 weeks later (per family) and was neg retesting todayQuestionable sepsis - evaluate for abnormality EXAM: PORTABLE CHEST 1 VIEW COMPARISON:  01/18/2020 FINDINGS: Enlarged cardiac silhouette. Low lung volumes. No effusion, infiltrate, or pneumothorax. Mild LEFT basilar atelectasis. IMPRESSION: 1. No interval change. 2. Low lung volumes. 3. Mild LEFT basilar atelectasis. Electronically Signed   By: Suzy Bouchard M.D.   On: 01/31/2020 12:33    Procedures .Critical Care Performed by: Luna Fuse, MD Authorized by: Luna Fuse, MD   Critical care provider statement:    Critical care time (minutes):  45   Critical care time was exclusive of:  Separately billable procedures and treating other patients   Critical care was time spent personally by me on the following activities:  Discussions with consultants, evaluation of patient's response to treatment, examination of patient, ordering and performing treatments and interventions, ordering and review of laboratory studies, ordering and review of radiographic studies, pulse oximetry, re-evaluation of patient's condition, obtaining history from patient or surrogate and review of old charts   Care discussed with: admitting provider     (including critical care time)  Medications Ordered in ED Medications  lactated ringers infusion ( Intravenous New Bag/Given 01/31/20 1259)  piperacillin-tazobactam (ZOSYN) IVPB 3.375 g (0 g Intravenous Stopped 01/31/20 1302)  vancomycin (VANCOCIN) IVPB 1000 mg/200 mL premix (0 mg Intravenous Stopped 01/31/20 1440)  lactated ringers bolus 1,000 mL (0 mLs Intravenous Stopped 01/31/20 1302)    And  lactated ringers bolus 1,000 mL (0 mLs Intravenous Stopped 01/31/20 1440)    And  lactated ringers bolus 1,000 mL (0 mLs Intravenous Stopped 01/31/20 1302)    ED Course  I have reviewed the triage vital  signs and the nursing notes.  Pertinent labs & imaging results that were available during my care of the patient were reviewed by me  and considered in my medical decision making (see chart for details).    MDM Rules/Calculators/A&P                          Patient was hyper tensive on arrival and hypoxic.  I was called immediately to his bedside.  He is placed on nonrebreather sepsis protocol initiated.  Lactic acid elevated 2.3.  Blood pressure appeared to improve with 3 L of IV fluids.  Subsequent blood pressures have been 170 systolic to 017 systolic with maps are greater than 60.  Chest x-ray is unremarkable from prior imaging.  CT angio considered but creatinine is elevated 2.5 with a baseline around 1.0-1.25.  Urine concerning for possible UTI but the patient has an indwelling Foley catheter from prior admission.  Respite therapist requested to assist in managing patient's oxygenation, he was weaned down to 2 L nasal cannula satting 100% which is appropriate.    Final Clinical Impression(s) / ED Diagnoses Final diagnoses:  Sepsis, due to unspecified organism, unspecified whether acute organ dysfunction present ([place])  Hypoxemia  Hypotension, unspecified hypotension type    Rx / DC Orders ED Discharge Orders    None       Luna Fuse, MD 01/31/20 1502

## 2020-01-31 NOTE — ED Notes (Signed)
Pt placed on a NRB at 15L pt now satting 90

## 2020-01-31 NOTE — H&P (Signed)
Patient Demographics:    Ronnie Gregory, is a 87 y.o. male  MRN: [id number]   DOB - 08/11/1933  Admit Date - 01/31/2020  Outpatient Primary MD for the patient is Glenda Chroman, MD   Assessment & Plan:    Principal Problem:   Sepsis secondary to UTI [facility]) Active Problems:   Acute metabolic encephalopathy    H/o COVID-19 virus infection--+ve 01/09/20 , negative 01/25/20 (vaccinated Fall of 2021)   CAD (coronary artery disease)   S/P coronary artery stent placement   A/p  1)Sepsis due to Urinary Source---  Lactic Acid 2.3 >>>1.7 after IVF WBC  18.2 Covid Neg -IV cefepime pending culture results  -IV fluids  2)Aki on CKD 3B--- Creatinine is 2.52 , baseline 1.3 to 1.5 -Worsening renal function due to dehydration and the patient with altered mentation and poor intake, compounded by lisinopril use -, renally adjust medications, avoid nephrotoxic agents / dehydration  / hypotension -IV fluids as ordered -Stop lisinopril  3)Acute Metabolic Encephalopathy--- most likely due to #1 and #2 above compounded by opiate and muscle relaxant to use  4)Anemia Hgb 10.5----acute Vs acute on chronic -Anemia work-up requested including stool occult blood - patient was recently started on Eliquis -Anemia work-up reveals iron deficiency with a serum iron of 25 and iron saturation of 11% -Ferritin is not low -B12 and folate WNL  5)DM2--- Recent A1C is 7.3 Use Novolog/Humalog Sliding scale insulin with Accu-Cheks/Fingersticks as ordered  6)CAD-- NSTEMI and stents in 2012----no chest pains, hold metoprolol due to hypotension, -Continue pravastatin, no aspirin due to Eliquis use   7)Recent Covid -19 Infection--- tested positive 01/09/20, tested Negative 1 week  ago and Negative again 01/31/20 -Previously completed  Vaccination in early fall 2021 (not boosted) -Does not appear to have any acute/active COVID-19 symptoms at this time  8)Social/Ethics--Full code, with full scope of treatment  Discussed with daughter Foye Clock [phone] is POA)  9)HTN-hold lisinopril and metoprolol due to soft BP in the setting of sepsis  10)Hypothyroidism --okay to restart levothyroxine 75 mcg daily  11) urinary retention-- -Please note the patient had a Foley catheter placed around 01/26/2019 at the rehab facility in [place] apparently due to urinary retention -BP is too soft to allow for initiation of Flomax at this time -We need to change Foley catheter  Disposition/Need for in-Hospital Stay- patient unable to be discharged at this time due to --- sepsis pathophysiology requiring IV fluids and IV antibiotics pending culture data  Status is: Inpatient  Remains inpatient appropriate because:Please see above   Dispo: The patient is from: SNF              Anticipated d/c is to: SNF              Anticipated d/c date is: 2 days              Patient currently is not medically stable to d/c. Barriers: Not Clinically Stable-  With History of - Reviewed by me  Past Medical History:  Diagnosis Date  . Arthritis   . Chronic kidney disease   . Constipation    uses stool softener daily  . GERD (gastroesophageal reflux disease)   . Hypercholesteremia   . Hypertension   . Hypothyroidism   . Myocardial infarction [facility]) 2009   Dr. Cathren Harsh cardiologist 2 stents      Past Surgical History:  Procedure Laterality Date  . APPENDECTOMY    . BACK SURGERY     several   . CARDIAC CATHETERIZATION    . CORONARY ANGIOPLASTY     3 years ago  . LUMBAR LAMINECTOMY/ DECOMPRESSION WITH MET-RX N/A 03/05/2015   Procedure: Removal of posterior hardware Lumbar one with Metrex;  Surgeon: Kristeen Miss, MD;  Location: [facility];  Service: Neurosurgery;  Laterality: N/A;  . SKIN SURGERY     skin cancer removed  from forehead    Chief Complaint  Patient presents with  . Altered Mental Status      HPI:    Ronnie Gregory  is a 87 y.o. male with past medical history relevant for HTN, recent COVID-19 infection, CAD with prior stents in 2012, hypothyroidism, DM 2, CKD 3B who presents to the ED from SNF due to worsening altered mental status --  Most of the history is obtained from patient's  daughter Foye Clock [phone] is POA)--as patient has altered mentation  -Patient was recently admitted to [facility] on 01/09/2020 with COVID-19 infection (he was vaccinated in the fall but he did not get his booster yet)--- during hospitalization patient had confusion and disorientation, he was treated with steroids and remdesivir, he was found to be hypoxic received bronchodilators and supplemental oxygen -He was discharged on 01/25/2020 from [facility] to SNF rehab in [place] - Pt's  daughter Foye Clock [phone] is POA)--- took patient out of the SNF in [place] today 01/31/2020 due to altered mentation that was getting worse and brought him straight to the ED today for evaluation  --The patient is found to be tachycardic, tachypneic, hypotensive with leukocytosis, elevated lactic acid levels altered mentation and UA suspicious for possible UTI -Please note the patient had a Foley catheter placed around 01/26/2019 at the rehab facility in [place] apparently due to urinary retention -- Blood and urine culture sent and IV antibiotics initiated in the ED  -COVID-19 test negative today, please note the patient previously tested positive on 01/09/2020, please note that patient is vaccinated but not booked - In the ED patient's creatinine found to be up to 2.52 from a baseline usually between 1.2 and 1.5 -Elevated lactic acid at 2.3 noted improved to 1.7 after IV fluids -Imaging studies in the ED revealed negative chest x-ray and negative CT head from a stroke or bleed standpoint, chest  x-ray   Review of systems:    In addition to the HPI above,   A full Review of  Systems was done, all other systems reviewed are negative except as noted above in HPI , .    Social History:  Reviewed by me    Social History   Tobacco Use  . Smoking status: Former Smoker    Start date: 01/20/1955    Quit date: 01/20/1960    Years since quitting: 60.0  . Smokeless tobacco: Never Used  Substance Use Topics  . Alcohol use: No    Alcohol/week: 0.0 standard drinks     Family History :  Reviewed by me  Family History  Problem Relation Age of Onset  . Alzheimer's disease Mother      Home Medications:   Prior to Admission medications   Medication Sig Start Date End Date Taking? Authorizing Provider  aspirin EC 81 MG tablet Take 81 mg by mouth daily.    [provider]  CASCARA SAGRADA PO Take 1 tablet by mouth at bedtime. Stool softner    [provider]  HYDROcodone-acetaminophen (NORCO/VICODIN) 5-325 MG tablet Take 1-2 tablets by mouth every 6 (six) hours as needed for moderate pain.    [provider]  levothyroxine (SYNTHROID, LEVOTHROID) 75 MCG tablet Take 75 mcg by mouth daily before breakfast.    [provider]  lisinopril (ZESTRIL) 10 MG tablet Take 1 tablet (10 mg total) by mouth daily. 07/04/19   Herminio Commons, MD  metoprolol tartrate (LOPRESSOR) 25 MG tablet Take 25 mg by mouth 2 (two) times daily.     [provider]  pravastatin (PRAVACHOL) 40 MG tablet Take 40 mg by mouth at bedtime.    [provider]  tiZANidine (ZANAFLEX) 4 MG tablet Take 4 mg by mouth every 6 (six) hours as needed for muscle spasms.    [provider]     Allergies:     Allergies  Allergen Reactions  . Hydrocodone     Other reaction(s): Confusion, Hallucinations  . Mobic [Meloxicam] Other (See Comments)    Mucus     Physical Exam:   Vitals  Blood pressure (!) 100/50, pulse 70, temperature 98.5 F (36.9 C),  temperature source Rectal, resp. rate (!) 30, height _0  (1.727 m), weight 90.7 kg, SpO2 100 %.  Physical Examination: General appearance -ill-appearing, and in no distress Mental status -confused and somewhat lethargic  eyes - sclera anicteric Neck - supple, no JVD elevation , Chest - clear  to auscultation bilaterally, symmetrical air movement,  Heart - S1 and S2 normal, regular  Abdomen - soft, nontender, nondistended, no CVA tenderness Neurological -generalized weakness without new focal deficits extremities - no pedal edema noted, intact peripheral pulses  Skin - warm, dry GU-Foley in situ     Data Review:    CBC Recent Labs  Lab 01/31/20 1147  WBC 18.2*  HGB 10.5*  HCT 32.9*  PLT 479*  MCV 99.4  MCH 31.7  MCHC 31.9  RDW 13.6  LYMPHSABS 1.7  MONOABS 1.2*  EOSABS 0.2  BASOSABS 0.0   ------------------------------------------------------------------------------------------------------------------  Chemistries  Recent Labs  Lab 01/31/20 1147  NA 133*  K 4.0  CL 101  CO2 18*  GLUCOSE 202*  BUN 63*  CREATININE 2.52*  CALCIUM 8.9  AST 41  ALT 58*  ALKPHOS 101  BILITOT 1.1   ------------------------------------------------------------------------------------------------------------------ estimated creatinine clearance is 23.9 mL/min (A) (by C-G formula based on SCr of 2.52 mg/dL (H)). ------------------------------------------------------------------------------------------------------------------ No results for input(s): TSH, T4TOTAL, T3FREE, THYROIDAB in the last 72 hours.  Invalid input(s): FREET3   Coagulation profile Recent Labs  Lab 01/31/20 1147  INR 1.6*   ------------------------------------------------------------------------------------------------------------------- No results for input(s): DDIMER in the last 72  hours. -------------------------------------------------------------------------------------------------------------------  Cardiac Enzymes No results for input(s): CKMB, TROPONINI, MYOGLOBIN in the last 168 hours.  Invalid input(s): CK ------------------------------------------------------------------------------------------------------------------ No results found for: BNP   ---------------------------------------------------------------------------------------------------------------  Urinalysis    Component Value Date/Time   COLORURINE YELLOW 01/31/2020 1235   APPEARANCEUR CLOUDY (A) 01/31/2020 1235   LABSPEC 1.017 01/31/2020 1235   PHURINE 5.0 01/31/2020 1235   [place] 01/31/2020 1235  HGBUR LARGE (A) 01/31/2020 1235   BILIRUBINUR NEGATIVE 01/31/2020 1235   KETONESUR NEGATIVE 01/31/2020 1235   PROTEINUR 30 (A) 01/31/2020 1235   UROBILINOGEN 0.2 11/15/2014 1215   NITRITE NEGATIVE 01/31/2020 1235   LEUKOCYTESUR MODERATE (A) 01/31/2020 1235    ----------------------------------------------------------------------------------------------------------------   Imaging Results:    CT Head Wo Contrast  Result Date: 01/31/2020 CLINICAL DATA:  Altered mental status EXAM: CT HEAD WITHOUT CONTRAST TECHNIQUE: Contiguous axial images were obtained from the base of the skull through the vertex without intravenous contrast. COMPARISON:  01/09/2020 FINDINGS: Brain: There is no acute intracranial hemorrhage, mass effect, or edema. Gray-white differentiation is preserved. There is no extra-axial fluid collection. Prominence of the ventricles and sulci reflects stable generalized parenchymal volume loss. Patchy and confluent areas of hypoattenuation in the supratentorial white matter nonspecific but probably reflects stable advanced chronic microvascular ischemic changes. Vascular: There is atherosclerotic calcification at the skull base. Skull: Calvarium is unremarkable.  Sinuses/Orbits: Mild mucosal thickening.  Orbits are unremarkable. Other: None. IMPRESSION: No acute intracranial hemorrhage, mass effect, or evidence of acute infarction. Advanced chronic microvascular ischemic changes. Electronically Signed   By: Macy Mis M.D.   On: 01/31/2020 13:59   DG Chest Port 1 View  Result Date: 01/31/2020 CLINICAL DATA:  AMS/Spesis/htn/hx MI/ex smoker/hx heart cath Covid + 12/2019-retested 2 weeks later (per family) and was neg retesting todayQuestionable sepsis - evaluate for abnormality EXAM: PORTABLE CHEST 1 VIEW COMPARISON:  01/18/2020 FINDINGS: Enlarged cardiac silhouette. Low lung volumes. No effusion, infiltrate, or pneumothorax. Mild LEFT basilar atelectasis. IMPRESSION: 1. No interval change. 2. Low lung volumes. 3. Mild LEFT basilar atelectasis. Electronically Signed   By: Suzy Bouchard M.D.   On: 01/31/2020 12:33    Radiological Exams on Admission: CT Head Wo Contrast  Result Date: 01/31/2020 CLINICAL DATA:  Altered mental status EXAM: CT HEAD WITHOUT CONTRAST TECHNIQUE: Contiguous axial images were obtained from the base of the skull through the vertex without intravenous contrast. COMPARISON:  01/09/2020 FINDINGS: Brain: There is no acute intracranial hemorrhage, mass effect, or edema. Gray-white differentiation is preserved. There is no extra-axial fluid collection. Prominence of the ventricles and sulci reflects stable generalized parenchymal volume loss. Patchy and confluent areas of hypoattenuation in the supratentorial white matter nonspecific but probably reflects stable advanced chronic microvascular ischemic changes. Vascular: There is atherosclerotic calcification at the skull base. Skull: Calvarium is unremarkable. Sinuses/Orbits: Mild mucosal thickening.  Orbits are unremarkable. Other: None. IMPRESSION: No acute intracranial hemorrhage, mass effect, or evidence of acute infarction. Advanced chronic microvascular ischemic changes. Electronically  Signed   By: Macy Mis M.D.   On: 01/31/2020 13:59   DG Chest Port 1 View  Result Date: 01/31/2020 CLINICAL DATA:  AMS/Spesis/htn/hx MI/ex smoker/hx heart cath Covid + 12/2019-retested 2 weeks later (per family) and was neg retesting todayQuestionable sepsis - evaluate for abnormality EXAM: PORTABLE CHEST 1 VIEW COMPARISON:  01/18/2020 FINDINGS: Enlarged cardiac silhouette. Low lung volumes. No effusion, infiltrate, or pneumothorax. Mild LEFT basilar atelectasis. IMPRESSION: 1. No interval change. 2. Low lung volumes. 3. Mild LEFT basilar atelectasis. Electronically Signed   By: Suzy Bouchard M.D.   On: 01/31/2020 12:33    DVT Prophylaxis -SCDeliquis AM Labs Ordered, also please review Full Orders  Family Communication: Admission, patients condition and plan of care including tests being ordered have been discussed with daughter/POA who indicate understanding and agree with the plan   Code Status - Full Code  Likely DC to SNF rehab  Condition   stable  Woods Gangemi  Adriana Lina M.D on 01/31/2020 at 5:59 PM Go to [email or web address] -  for contact info  [facility] - Office  [phone]

## 2020-01-31 NOTE — ED Notes (Signed)
Date and time results received: 01/31/20 1234 (use smartphrase ".now" to insert current time)  Test: lactic acid Critical Value: 2.3  Name of Provider Notified: [place]  Orders Received? Or Actions Taken?:

## 2020-01-31 NOTE — ED Triage Notes (Signed)
Pt to er, daughter states that on dec 21 pt tested positive for covid and pt went to [facility] for diarrhea.  States that after two weeks he was negative for covid and transfer to rehab, states that at rehab he isn't being cared for very well, states that he is now sedate and confused, states that when the went to [facility] he was a and o times three and ambulatory.

## 2020-01-31 NOTE — ED Notes (Signed)
Pt satting 87 on 4L O2 via [place], pt increased to 6L, pt mouth breathing

## 2020-01-31 NOTE — Sepsis Progress Note (Signed)
eLink monitoring this Code Sepsis. 

## 2020-01-31 NOTE — Progress Notes (Signed)
Pharmacy Antibiotic Note  Ronnie Gregory is a 87 y.o. male admitted on 01/31/2020 with sepsis.  Pharmacy has been consulted for Cefepime dosing.  Plan: Cefepime 2000 mg IV every 24 hours. Monitor labs, c/s, and patient improvement.  Height: 5\' 8"  (172.7 cm) Weight: 90.7 kg (200 lb) IBW/kg (Calculated) : 68.4  Temp (24hrs), Avg:98 F (36.7 C), Min:97.5 F (36.4 C), Max:98.5 F (36.9 C)  Recent Labs  Lab 01/31/20 1147 01/31/20 1408  WBC 18.2*  --   CREATININE 2.52*  --   LATICACIDVEN 2.3* 1.7    Estimated Creatinine Clearance: 23.9 mL/min (A) (by C-G formula based on SCr of 2.52 mg/dL (H)).    Allergies  Allergen Reactions  . Hydrocodone     Other reaction(s): Confusion, Hallucinations  . Mobic [Meloxicam] Other (See Comments)    Mucus    Antimicrobials this admission: Cefepime 1/12 >>  Vanco 1/12 x 1    Microbiology results: 1/12 BCx: pending 1/12 UCx: pending    Thank you for allowing pharmacy to be a part of this patient's care.  3/12 01/31/2020 4:10 PM

## 2020-02-01 DIAGNOSIS — N390 Urinary tract infection, site not specified: Secondary | ICD-10-CM | POA: Diagnosis not present

## 2020-02-01 DIAGNOSIS — A419 Sepsis, unspecified organism: Secondary | ICD-10-CM | POA: Diagnosis not present

## 2020-02-01 LAB — COMPREHENSIVE METABOLIC PANEL
ALT: 61 U/L — ABNORMAL HIGH (ref 0–44)
AST: 44 U/L — ABNORMAL HIGH (ref 15–41)
Albumin: 2.1 g/dL — ABNORMAL LOW (ref 3.5–5.0)
Alkaline Phosphatase: 95 U/L (ref 38–126)
Anion gap: 8 (ref 5–15)
BUN: 46 mg/dL — ABNORMAL HIGH (ref 8–23)
CO2: 23 mmol/L (ref 22–32)
Calcium: 8.3 mg/dL — ABNORMAL LOW (ref 8.9–10.3)
Chloride: 105 mmol/L (ref 98–111)
Creatinine, Ser: 1.55 mg/dL — ABNORMAL HIGH (ref 0.61–1.24)
GFR, Estimated: 44 mL/min — ABNORMAL LOW (ref >60)
Glucose, Bld: 114 mg/dL — ABNORMAL HIGH (ref 70–99)
Potassium: 3.7 mmol/L (ref 3.5–5.1)
Sodium: 136 mmol/L (ref 135–145)
Total Bilirubin: 0.9 mg/dL (ref 0.3–1.2)
Total Protein: 5.4 g/dL — ABNORMAL LOW (ref 6.5–8.1)

## 2020-02-01 LAB — GLUCOSE, CAPILLARY: Glucose-Capillary: 147 mg/dL — ABNORMAL HIGH (ref 70–99)

## 2020-02-01 LAB — CBG MONITORING, ED
Glucose-Capillary: 107 mg/dL — ABNORMAL HIGH (ref 70–99)
Glucose-Capillary: 112 mg/dL — ABNORMAL HIGH (ref 70–99)

## 2020-02-01 MED ORDER — CHLORHEXIDINE GLUCONATE CLOTH 2 % EX PADS
6.0000 | MEDICATED_PAD | Freq: Every day | CUTANEOUS | Status: DC
  Administered 2020-02-02 – 2020-02-05 (×4): 6 via TOPICAL

## 2020-02-01 MED ORDER — SODIUM CHLORIDE 0.9 % IV SOLN
2.0000 g | Freq: Two times a day (BID) | INTRAVENOUS | Status: AC
  Administered 2020-02-01 – 2020-02-03 (×6): 2 g via INTRAVENOUS
  Filled 2020-02-01 (×6): qty 2

## 2020-02-01 MED ORDER — SODIUM CHLORIDE 0.9 % IV SOLN: INTRAVENOUS | Status: AC

## 2020-02-01 NOTE — Progress Notes (Signed)
Patient Demographics:    Ronnie Gregory, is a 87 y.o. male, DOB - 1933-06-07, OIN:[id number]  Admit date - 01/31/2020   Admitting Physician Yariah Selvey Mariea Clonts, MD  Outpatient Primary MD for the patient is Ignatius Specking, MD  LOS - 1   Chief Complaint  Patient presents with  . Altered Mental Status        Subjective:    Ronnie Gregory today has no fevers, no emesis,  No chest pain,   -More talkative, wanting to eat more,  Assessment  & Plan :    Principal Problem:   Sepsis secondary to UTI [facility]) Active Problems:   Acute metabolic encephalopathy    H/o COVID-19 virus infection--+ve 01/09/20 , negative 01/25/20 (vaccinated Fall of 2021)   CAD (coronary artery disease)   S/P coronary artery stent placement  Brief Summary:- 87 y.o. male with past medical history relevant for HTN, recent COVID-19 infection, CAD with prior stents in 2012, hypothyroidism, DM 2, CKD 3B who presents to the ED from SNF due to worsening altered mental status, admitted on 01/31/2020 with sepsis presumably from urinary source  A/p  1)Sepsis due to Urinary Source---  Lactic Acid 2.3 >>>1.7 after IVF WBC  18.2 Covid Neg --Continue IV cefepime pending culture results  -IV fluids  2)Aki on CKD 3B--- on admission creatinine was 2.52 , baseline 1.3 to 1.5 -Worsening renal function due to dehydration and the patient with altered mentation and poor intake, compounded by lisinopril use -, renally adjust medications, avoid nephrotoxic agents / dehydration  / hypotension -IV fluids as ordered -Stopped lisinopril -Creatinine improved to 1.55 with hydration  3)Acute Metabolic Encephalopathy--- most likely due to #1 and #2 above compounded by opiate and muscle relaxant to use -Overall improving with avoidance of excessive opiates and treatment of sepsis and dehydration/AKI  4) iron deficiency anemia Hgb 10.5----acute Vs acute on  chronic -Anemia work-up requested including stool occult blood - patient was recently started on Eliquis -Anemia work-up reveals iron deficiency with a serum iron of 25 and iron saturation of 11% -Ferritin is not low -B12 and folate WNL  5)DM2--- Recent A1C is 7.3 Use Novolog/Humalog Sliding scale insulin with Accu-Cheks/Fingersticks as ordered  6)CAD-- NSTEMI and stents in 2012----no chest pains, hold metoprolol due to hypotension, -Continue pravastatin, no aspirin due to Eliquis use   7)Recent Covid -19 Infection--- tested positive 01/09/20, tested Negative 1 week  ago and Negative again 01/31/20 -Previously completed Vaccination in early fall 2021 (not boosted) -Does not appear to have any acute/active COVID-19 symptoms at this time  8)Social/Ethics--Full code, with full scope of treatment  Discussed with daughter [name] [phone] is POA)  9)HTN-hold lisinopril and metoprolol due to soft BP in the setting of sepsis  10)Hypothyroidism --continue levothyroxine 75 mcg daily  11) urinary retention-- -Please note the patient had a Foley catheter placed around 01/26/2019 at the rehab facility in [place] apparently due to urinary retention -BP is too soft to allow for initiation of Flomax at this time -We need to change Foley catheter  12)HypoNatrenia--resolved with IV fluid  --DVT prophylaxis--patient was recently started on low-dose Eliquis apparently post-COVID due to concerns for hypercoagulable state, will consider discontinuing Eliquis when patient becomes ambulatory  Disposition/Need for in-Hospital Stay- patient unable  to be discharged at this time due to --- sepsis pathophysiology requiring IV fluids and IV antibiotics pending culture data  Status is: Inpatient  Remains inpatient appropriate because:Please see above   Dispo: The patient is from: SNF  Anticipated d/c is to: SNF  Anticipated d/c date is: 2 days   Patient currently is not medically stable to d/c. Barriers: Not Clinically Stable-    Code Status :  -  Code Status: Full Code   Family Communication:    (patient is alert, awake and coherent)  Discussed with with POA  Consults  :  na  DVT Prophylaxis  :   - SCDs  apixaban (ELIQUIS) tablet 2.5 mg Start: 01/31/20 2200 SCDs Start: 01/31/20 1546 Place TED hose Start: 01/31/20 1546 apixaban (ELIQUIS) tablet 2.5 mg    Lab Results  Component Value Date   PLT 479 (H) 01/31/2020    Inpatient Medications  Scheduled Meds: . apixaban  2.5 mg Oral BID  . insulin aspart  0-5 Units Subcutaneous QHS  . insulin aspart  0-6 Units Subcutaneous TID WC  . levothyroxine  75 mcg Oral QAC breakfast  . pantoprazole (PROTONIX) IV  40 mg Intravenous Q24H  . pravastatin  40 mg Oral QHS  . sodium chloride flush  3 mL Intravenous Q12H  . sodium chloride flush  3 mL Intravenous Q12H   Continuous Infusions: . sodium chloride    . ceFEPime (MAXIPIME) IV Stopped (02/01/20 1033)   PRN Meds:.sodium chloride, acetaminophen **OR** acetaminophen, bisacodyl, HYDROcodone-acetaminophen, ondansetron **OR** ondansetron (ZOFRAN) IV, polyethylene glycol, sodium chloride flush    Anti-infectives (From admission, onward)   Start     Dose/Rate Route Frequency Ordered Stop   02/01/20 1000  ceFEPIme (MAXIPIME) 2 g in sodium chloride 0.9 % 100 mL IVPB        2 g 200 mL/hr over 30 Minutes Intravenous Every 12 hours 02/01/20 0832     01/31/20 1800  ceFEPIme (MAXIPIME) 2 g in sodium chloride 0.9 % 100 mL IVPB  Status:  Discontinued        2 g 200 mL/hr over 30 Minutes Intravenous Every 24 hours 01/31/20 1554 02/01/20 0832   01/31/20 1215  piperacillin-tazobactam (ZOSYN) IVPB 3.375 g        3.375 g 100 mL/hr over 30 Minutes Intravenous  Once 01/31/20 1203 01/31/20 1302   01/31/20 1215  vancomycin (VANCOCIN) IVPB 1000 mg/200 mL premix        1,000 mg 200 mL/hr over 60 Minutes Intravenous  Once 01/31/20 1203 01/31/20  1440        Objective:   Vitals:   02/01/20 1130 02/01/20 1404 02/01/20 1430 02/01/20 1600  BP: 134/73  133/79 (!) 122/57  Pulse: 77  85 88  Resp: (!) 29  (!) 25 (!) 26  Temp:  98.3 F (36.8 C)    TempSrc:  Oral    SpO2: 100%  100% 100%  Weight:      Height:        Wt Readings from Last 3 Encounters:  01/31/20 90.7 kg  03/30/19 95.3 kg  08/27/17 88.5 kg     Intake/Output Summary (Last 24 hours) at 02/01/2020 1740 Last data filed at 02/01/2020 1034 Gross per 24 hour  Intake 3580 ml  Output 1400 ml  Net 2180 ml     Physical Exam  Gen:- Awake Alert, more talkative HEENT:- .AT, No sclera icterus Neck-Supple Neck,No JVD,.  Lungs-  CTAB , fair symmetrical air movement CV- S1, S2 normal, irregular  Abd-  +ve B.Sounds, Abd Soft, No tenderness,    Extremity/Skin:- No  edema, pedal pulses present  Psych-affect is appropriate, oriented x3 Neuro-generalized weakness, no new focal deficits, no tremors GU-Foley catheter in situ   Data Review:   Micro Results Recent Results (from the past 240 hour(s))  Resp Panel by RT-PCR (Flu A&B, Covid) Nasopharyngeal Swab     Status: None   Collection Time: 01/31/20 11:45 AM   Specimen: Nasopharyngeal Swab; Nasopharyngeal(NP) swabs in vial transport medium  Result Value Ref Range Status   SARS Coronavirus 2 by RT PCR NEGATIVE NEGATIVE Final    Comment: (NOTE) SARS-CoV-2 target nucleic acids are NOT DETECTED.  The SARS-CoV-2 RNA is generally detectable in upper respiratory specimens during the acute phase of infection. The lowest concentration of SARS-CoV-2 viral copies this assay can detect is 138 copies/mL. A negative result does not preclude SARS-Cov-2 infection and should not be used as the sole basis for treatment or other patient management decisions. A negative result may occur with  improper specimen collection/handling, submission of specimen other than nasopharyngeal swab, presence of viral mutation(s) within  the areas targeted by this assay, and inadequate number of viral copies(<138 copies/mL). A negative result must be combined with clinical observations, patient history, and epidemiological information. The expected result is Negative.  Fact Sheet for Patients:  BloggerCourse.com  Fact Sheet for Healthcare Providers:  SeriousBroker.it  This test is no t yet approved or cleared by the [place] FDA and  has been authorized for detection and/or diagnosis of SARS-CoV-2 by FDA under an Emergency Use Authorization (EUA). This EUA will remain  in effect (meaning this test can be used) for the duration of the COVID-19 declaration under Section 564(b)(1) of the Act, 21 U.S.C.section 360bbb-3(b)(1), unless the authorization is terminated  or revoked sooner.       Influenza A by PCR NEGATIVE NEGATIVE Final   Influenza B by PCR NEGATIVE NEGATIVE Final    Comment: (NOTE) The Xpert Xpress SARS-CoV-2/FLU/RSV plus assay is intended as an aid in the diagnosis of influenza from Nasopharyngeal swab specimens and should not be used as a sole basis for treatment. Nasal washings and aspirates are unacceptable for Xpert Xpress SARS-CoV-2/FLU/RSV testing.  Fact Sheet for Patients: BloggerCourse.com  Fact Sheet for Healthcare Providers: SeriousBroker.it  This test is not yet approved or cleared by the [place] FDA and has been authorized for detection and/or diagnosis of SARS-CoV-2 by FDA under an Emergency Use Authorization (EUA). This EUA will remain in effect (meaning this test can be used) for the duration of the COVID-19 declaration under Section 564(b)(1) of the Act, 21 U.S.C. section 360bbb-3(b)(1), unless the authorization is terminated or revoked.  Performed at [facility], [address], [place], [place] [address]   Blood Culture (routine x 2)     Status: None (Preliminary  result)   Collection Time: 01/31/20 11:47 AM   Specimen: BLOOD  Result Value Ref Range Status   Specimen Description BLOOD RIGHT ANTECUBITAL  Final   Special Requests   Final    Blood Culture adequate volume BOTTLES DRAWN AEROBIC AND ANAEROBIC   Culture   Final    NO GROWTH 1 DAY Performed at [facility], [address] [place], [place] [address]    Report Status PENDING  Incomplete  Blood Culture (routine x 2)     Status: None (Preliminary result)   Collection Time: 01/31/20 11:47 AM   Specimen: BLOOD  Result Value Ref Range Status   Specimen  Description BLOOD LEFT ANTECUBITAL  Final   Special Requests   Final    Blood Culture adequate volume BOTTLES DRAWN AEROBIC AND ANAEROBIC   Culture   Final    NO GROWTH 1 DAY Performed at [facility], [address] [place], [place] [address]    Report Status PENDING  Incomplete    Radiology Reports CT Head Wo Contrast  Result Date: 01/31/2020 CLINICAL DATA:  Altered mental status EXAM: CT HEAD WITHOUT CONTRAST TECHNIQUE: Contiguous axial images were obtained from the base of the skull through the vertex without intravenous contrast. COMPARISON:  01/09/2020 FINDINGS: Brain: There is no acute intracranial hemorrhage, mass effect, or edema. Gray-white differentiation is preserved. There is no extra-axial fluid collection. Prominence of the ventricles and sulci reflects stable generalized parenchymal volume loss. Patchy and confluent areas of hypoattenuation in the supratentorial white matter nonspecific but probably reflects stable advanced chronic microvascular ischemic changes. Vascular: There is atherosclerotic calcification at the skull base. Skull: Calvarium is unremarkable. Sinuses/Orbits: Mild mucosal thickening.  Orbits are unremarkable. Other: None. IMPRESSION: No acute intracranial hemorrhage, mass effect, or evidence of acute infarction. Advanced chronic microvascular ischemic changes. Electronically Signed   By: Guadlupe Spanish M.D.    On: 01/31/2020 13:59   DG Chest Port 1 View  Result Date: 01/31/2020 CLINICAL DATA:  AMS/Spesis/htn/hx MI/ex smoker/hx heart cath Covid + 12/2019-retested 2 weeks later (per family) and was neg retesting todayQuestionable sepsis - evaluate for abnormality EXAM: PORTABLE CHEST 1 VIEW COMPARISON:  01/18/2020 FINDINGS: Enlarged cardiac silhouette. Low lung volumes. No effusion, infiltrate, or pneumothorax. Mild LEFT basilar atelectasis. IMPRESSION: 1. No interval change. 2. Low lung volumes. 3. Mild LEFT basilar atelectasis. Electronically Signed   By: Genevive Bi M.D.   On: 01/31/2020 12:33     CBC Recent Labs  Lab 01/31/20 1147  WBC 18.2*  HGB 10.5*  HCT 32.9*  PLT 479*  MCV 99.4  MCH 31.7  MCHC 31.9  RDW 13.6  LYMPHSABS 1.7  MONOABS 1.2*  EOSABS 0.2  BASOSABS 0.0    Chemistries  Recent Labs  Lab 01/31/20 1147 02/01/20 0517  NA 133* 136  K 4.0 3.7  CL 101 105  CO2 18* 23  GLUCOSE 202* 114*  BUN 63* 46*  CREATININE 2.52* 1.55*  CALCIUM 8.9 8.3*  AST 41 44*  ALT 58* 61*  ALKPHOS 101 95  BILITOT 1.1 0.9   ------------------------------------------------------------------------------------------------------------------ No results for input(s): CHOL, HDL, LDLCALC, TRIG, CHOLHDL, LDLDIRECT in the last 72 hours.  No results found for: HGBA1C ------------------------------------------------------------------------------------------------------------------ No results for input(s): TSH, T4TOTAL, T3FREE, THYROIDAB in the last 72 hours.  Invalid input(s): FREET3 ------------------------------------------------------------------------------------------------------------------ Recent Labs    01/31/20 1147  VITAMINB12 218  FOLATE 11.5  FERRITIN 859*  TIBC 221*  IRON 25*    Coagulation profile Recent Labs  Lab 01/31/20 1147  INR 1.6*    No results for input(s): DDIMER in the last 72 hours.  Cardiac Enzymes No results for input(s): CKMB, TROPONINI, MYOGLOBIN  in the last 168 hours.  Invalid input(s): CK ------------------------------------------------------------------------------------------------------------------ No results found for: BNP   Shon Hale M.D on 02/01/2020 at 5:40 PM  Go to [email or web address] - for contact info  [facility] - Office  [phone]

## 2020-02-02 DIAGNOSIS — R6510 Systemic inflammatory response syndrome (SIRS) of non-infectious origin without acute organ dysfunction: Secondary | ICD-10-CM | POA: Diagnosis not present

## 2020-02-02 LAB — GLUCOSE, CAPILLARY
Glucose-Capillary: 109 mg/dL — ABNORMAL HIGH (ref 70–99)
Glucose-Capillary: 113 mg/dL — ABNORMAL HIGH (ref 70–99)
Glucose-Capillary: 115 mg/dL — ABNORMAL HIGH (ref 70–99)
Glucose-Capillary: 120 mg/dL — ABNORMAL HIGH (ref 70–99)

## 2020-02-02 LAB — CBC
HCT: 26.6 % — ABNORMAL LOW (ref 39.0–52.0)
Hemoglobin: 8.2 g/dL — ABNORMAL LOW (ref 13.0–17.0)
MCH: 31.2 pg (ref 26.0–34.0)
MCHC: 30.8 g/dL (ref 30.0–36.0)
MCV: 101.1 fL — ABNORMAL HIGH (ref 80.0–100.0)
Platelets: 294 10*3/uL (ref 150–400)
RBC: 2.63 MIL/uL — ABNORMAL LOW (ref 4.22–5.81)
RDW: 13.4 % (ref 11.5–15.5)
WBC: 8.8 10*3/uL (ref 4.0–10.5)
nRBC: 0 % (ref 0.0–0.2)

## 2020-02-02 LAB — RENAL FUNCTION PANEL
Albumin: 2.1 g/dL — ABNORMAL LOW (ref 3.5–5.0)
Anion gap: 9 (ref 5–15)
BUN: 32 mg/dL — ABNORMAL HIGH (ref 8–23)
CO2: 20 mmol/L — ABNORMAL LOW (ref 22–32)
Calcium: 7.9 mg/dL — ABNORMAL LOW (ref 8.9–10.3)
Chloride: 105 mmol/L (ref 98–111)
Creatinine, Ser: 1.3 mg/dL — ABNORMAL HIGH (ref 0.61–1.24)
GFR, Estimated: 54 mL/min — ABNORMAL LOW (ref >60)
Glucose, Bld: 116 mg/dL — ABNORMAL HIGH (ref 70–99)
Phosphorus: 2.7 mg/dL (ref 2.5–4.6)
Potassium: 3.5 mmol/L (ref 3.5–5.1)
Sodium: 134 mmol/L — ABNORMAL LOW (ref 135–145)

## 2020-02-02 LAB — URINE CULTURE: Culture: NO GROWTH

## 2020-02-02 NOTE — Progress Notes (Signed)
Patient Demographics:    Ronnie Gregory, is a 87 y.o. male, DOB - 05-27-1933, RDE:[id number]  Admit date - 01/31/2020   Admitting Physician Jame Seelig Mariea Clonts, MD  Outpatient Primary MD for the patient is Ignatius Specking, MD  LOS - 2   Chief Complaint  Patient presents with  . Altered Mental Status        Subjective:    Ronnie Gregory today has no fevers, no emesis,  No chest pain,   Resting----oral intake is fair  Assessment  & Plan :    Principal Problem:   SIRS (systemic inflammatory response syndrome) (HCC) Active Problems:   Acute metabolic encephalopathy    H/o COVID-19 virus infection--+ve 01/09/20 , negative 01/25/20 (vaccinated Fall of 2021)   CAD (coronary artery disease)   S/P coronary artery stent placement  Brief Summary:- 87 y.o. male with past medical history relevant for HTN, recent COVID-19 infection, CAD with prior stents in 2012, hypothyroidism, DM 2, CKD 3B who presents to the ED from SNF due to worsening altered mental status, admitted on 01/31/2020 with sepsis presumably from urinary source  A/p  1)SIRS-- ?? source--  Lactic Acid 2.3 >>>1.7 after IVF WBC  18.2>>.8.8 Covid Neg --Continue IV cefepime pending culture results  -IV fluids -Blood and urine cultures NGTD -Initially septic source suspected on admission, however patient did NOT rule in for sepsis SIRS pathophysiology continues to improve -Plan to stop IV cefepime on 02/03/2020 if cultures are still negative  2)Aki on CKD 3B--- on admission creatinine was 2.52 , baseline 1.3 to 1.5 -Worsening renal function due to dehydration and the patient with altered mentation and poor intake, compounded by lisinopril use -, renally adjust medications, avoid nephrotoxic agents / dehydration  / hypotension -IV fluids as ordered -Stopped lisinopril -Creatinine improved to 1.30 with hydration  3)Acute Metabolic  Encephalopathy--- most likely due to #1 and #2 above compounded by opiate and muscle relaxant to use -Overall improving with avoidance of excessive opiates and treatment of sepsis and dehydration/AKI  4) iron deficiency anemia ---Hgb 10.5>>.8.2----acute Vs acute on chronic -Anemia work-up requested including stool occult blood - patient was recently started on Eliquis -Anemia work-up reveals iron deficiency with a serum iron of 25 and iron saturation of 11% -Ferritin is not low -B12 and folate WNL -Suspect some of patient's drop in hemoglobin is due to hemodilution  5)DM2--- Recent A1C is 7.3 Use Novolog/Humalog Sliding scale insulin with Accu-Cheks/Fingersticks as ordered  6)CAD-- NSTEMI and stents in 2012----no chest pains, hold metoprolol due to hypotension, -Continue pravastatin, no aspirin due to Eliquis use   7)Recent Covid -19 Infection--- tested positive 01/09/20, tested Negative 1 week  ago and Negative again 01/31/20 -Previously completed Vaccination in early fall 2021 (not boosted) -Does not appear to have any acute/active COVID-19 symptoms at this time  8)Social/Ethics--Full code, with full scope of treatment  Discussed with daughter [name] [phone] is POA)  9)HTN-hold lisinopril and metoprolol due to soft BP in the setting of sepsis  10)Hypothyroidism --continue levothyroxine 75 mcg daily  11) urinary retention-- -Please note the patient had a Foley catheter placed around 01/26/2019 at the rehab facility in [place] apparently due to urinary retention -BP is too soft to allow for initiation of Flomax at this  time Foley catheter changed on 02/02/2020  12)HypoNatrenia--improved with IV fluid, Na is 134  13) ambulatory dysfunction/generalized weakness--- PT eval requested  --DVT prophylaxis--patient was recently started on low-dose Eliquis apparently post-COVID due to concerns for hypercoagulable state, will consider discontinuing Eliquis when patient  becomes ambulatory  Disposition/Need for in-Hospital Stay- patient unable to be discharged at this time due to --- SIRS pathophysiology requiring IV fluids and IV antibiotics pending culture data  Status is: Inpatient  Remains inpatient appropriate because:Please see above   Dispo: The patient is from: SNF  Anticipated d/c is to: SNF  Anticipated d/c date is: 2 days  Patient currently is not medically stable to d/c. Barriers: Not Clinically Stable-    Code Status :  -  Code Status: Full Code   Family Communication:    (patient is alert, awake and coherent)  Discussed with with POA  Consults  :  na  DVT Prophylaxis  :   - SCDs  apixaban (ELIQUIS) tablet 2.5 mg Start: 01/31/20 2200 SCDs Start: 01/31/20 1546 Place TED hose Start: 01/31/20 1546 apixaban (ELIQUIS) tablet 2.5 mg    Lab Results  Component Value Date   PLT 294 02/02/2020    Inpatient Medications  Scheduled Meds: . apixaban  2.5 mg Oral BID  . Chlorhexidine Gluconate Cloth  6 each Topical Daily  . insulin aspart  0-5 Units Subcutaneous QHS  . insulin aspart  0-6 Units Subcutaneous TID WC  . levothyroxine  75 mcg Oral QAC breakfast  . pantoprazole (PROTONIX) IV  40 mg Intravenous Q24H  . pravastatin  40 mg Oral QHS  . sodium chloride flush  3 mL Intravenous Q12H  . sodium chloride flush  3 mL Intravenous Q12H   Continuous Infusions: . sodium chloride    . sodium chloride Stopped (02/02/20 1040)  . ceFEPime (MAXIPIME) IV Stopped (02/02/20 0929)   PRN Meds:.sodium chloride, acetaminophen **OR** acetaminophen, bisacodyl, HYDROcodone-acetaminophen, ondansetron **OR** ondansetron (ZOFRAN) IV, polyethylene glycol, sodium chloride flush    Anti-infectives (From admission, onward)   Start     Dose/Rate Route Frequency Ordered Stop   02/01/20 1000  ceFEPIme (MAXIPIME) 2 g in sodium chloride 0.9 % 100 mL IVPB        2 g 200 mL/hr over 30 Minutes Intravenous Every 12  hours 02/01/20 0832     01/31/20 1800  ceFEPIme (MAXIPIME) 2 g in sodium chloride 0.9 % 100 mL IVPB  Status:  Discontinued        2 g 200 mL/hr over 30 Minutes Intravenous Every 24 hours 01/31/20 1554 02/01/20 0832   01/31/20 1215  piperacillin-tazobactam (ZOSYN) IVPB 3.375 g        3.375 g 100 mL/hr over 30 Minutes Intravenous  Once 01/31/20 1203 01/31/20 1302   01/31/20 1215  vancomycin (VANCOCIN) IVPB 1000 mg/200 mL premix        1,000 mg 200 mL/hr over 60 Minutes Intravenous  Once 01/31/20 1203 01/31/20 1440        Objective:   Vitals:   02/02/20 0900 02/02/20 1000 02/02/20 1159 02/02/20 1626  BP: (!) 161/63 134/71    Pulse: 80 79    Resp: (!) 29 (!) 24    Temp:   97.7 F (36.5 C) 99.4 F (37.4 C)  TempSrc:   Axillary Axillary  SpO2: 95% 96%    Weight:      Height:        Wt Readings from Last 3 Encounters:  02/02/20 89 kg  03/30/19 95.3 kg  08/27/17 88.5 kg     Intake/Output Summary (Last 24 hours) at 02/02/2020 1819 Last data filed at 02/02/2020 1045 Gross per 24 hour  Intake 776.28 ml  Output 600 ml  Net 176.28 ml     Physical Exam  Gen:- Awake Alert, more talkative HEENT:- [place].AT, No sclera icterus Neck-Supple Neck,No JVD,.  Lungs-  CTAB , fair symmetrical air movement CV- S1, S2 normal, irregular  Abd-  +ve B.Sounds, Abd Soft, No tenderness,    Extremity/Skin:- No  edema, pedal pulses present  Psych-affect is appropriate, oriented x3 Neuro-generalized weakness, no new focal deficits, no tremors GU-Foley catheter in situ (changed 02/02/2020)   Data Review:   Micro Results Recent Results (from the past 240 hour(s))  Resp Panel by RT-PCR (Flu A&B, Covid) Nasopharyngeal Swab     Status: None   Collection Time: 01/31/20 11:45 AM   Specimen: Nasopharyngeal Swab; Nasopharyngeal(NP) swabs in vial transport medium  Result Value Ref Range Status   SARS Coronavirus 2 by RT PCR NEGATIVE NEGATIVE Final    Comment: (NOTE) SARS-CoV-2 target nucleic acids are  NOT DETECTED.  The SARS-CoV-2 RNA is generally detectable in upper respiratory specimens during the acute phase of infection. The lowest concentration of SARS-CoV-2 viral copies this assay can detect is 138 copies/mL. A negative result does not preclude SARS-Cov-2 infection and should not be used as the sole basis for treatment or other patient management decisions. A negative result may occur with  improper specimen collection/handling, submission of specimen other than nasopharyngeal swab, presence of viral mutation(s) within the areas targeted by this assay, and inadequate number of viral copies(<138 copies/mL). A negative result must be combined with clinical observations, patient history, and epidemiological information. The expected result is Negative.  Fact Sheet for Patients:  BloggerCourse.com  Fact Sheet for Healthcare Providers:  SeriousBroker.it  This test is no t yet approved or cleared by the [place] FDA and  has been authorized for detection and/or diagnosis of SARS-CoV-2 by FDA under an Emergency Use Authorization (EUA). This EUA will remain  in effect (meaning this test can be used) for the duration of the COVID-19 declaration under Section 564(b)(1) of the Act, 21 U.S.C.section 360bbb-3(b)(1), unless the authorization is terminated  or revoked sooner.       Influenza A by PCR NEGATIVE NEGATIVE Final   Influenza B by PCR NEGATIVE NEGATIVE Final    Comment: (NOTE) The Xpert Xpress SARS-CoV-2/FLU/RSV plus assay is intended as an aid in the diagnosis of influenza from Nasopharyngeal swab specimens and should not be used as a sole basis for treatment. Nasal washings and aspirates are unacceptable for Xpert Xpress SARS-CoV-2/FLU/RSV testing.  Fact Sheet for Patients: BloggerCourse.com  Fact Sheet for Healthcare Providers: SeriousBroker.it  This test is not  yet approved or cleared by the [place] FDA and has been authorized for detection and/or diagnosis of SARS-CoV-2 by FDA under an Emergency Use Authorization (EUA). This EUA will remain in effect (meaning this test can be used) for the duration of the COVID-19 declaration under Section 564(b)(1) of the Act, 21 U.S.C. section 360bbb-3(b)(1), unless the authorization is terminated or revoked.  Performed at [facility], [address], [place], [place] [address]   Blood Culture (routine x 2)     Status: None (Preliminary result)   Collection Time: 01/31/20 11:47 AM   Specimen: BLOOD  Result Value Ref Range Status   Specimen Description BLOOD RIGHT ANTECUBITAL  Final   Special Requests   Final    Blood  Culture adequate volume BOTTLES DRAWN AEROBIC AND ANAEROBIC   Culture   Final    NO GROWTH 2 DAYS Performed at [facility], [address] [place], [place] [address]    Report Status PENDING  Incomplete  Blood Culture (routine x 2)     Status: None (Preliminary result)   Collection Time: 01/31/20 11:47 AM   Specimen: BLOOD  Result Value Ref Range Status   Specimen Description BLOOD LEFT ANTECUBITAL  Final   Special Requests   Final    Blood Culture adequate volume BOTTLES DRAWN AEROBIC AND ANAEROBIC   Culture   Final    NO GROWTH 2 DAYS Performed at [facility], [address] [place], [place] [address]    Report Status PENDING  Incomplete  Urine culture     Status: None   Collection Time: 01/31/20 12:35 PM   Specimen: Urine, Catheterized  Result Value Ref Range Status   Specimen Description   Final    URINE, CATHETERIZED Performed at [facility], [address] [place], [place] [address]    Special Requests   Final    NONE Performed at [facility], [address] [place], [place] [address]    Culture   Final    NO GROWTH Performed at [facility], [address] [place], [place] [address]    Report Status 02/02/2020 FINAL  Final    Radiology Reports CT  Head Wo Contrast  Result Date: 01/31/2020 CLINICAL DATA:  Altered mental status EXAM: CT HEAD WITHOUT CONTRAST TECHNIQUE: Contiguous axial images were obtained from the base of the skull through the vertex without intravenous contrast. COMPARISON:  01/09/2020 FINDINGS: Brain: There is no acute intracranial hemorrhage, mass effect, or edema. Gray-white differentiation is preserved. There is no extra-axial fluid collection. Prominence of the ventricles and sulci reflects stable generalized parenchymal volume loss. Patchy and confluent areas of hypoattenuation in the supratentorial white matter nonspecific but probably reflects stable advanced chronic microvascular ischemic changes. Vascular: There is atherosclerotic calcification at the skull base. Skull: Calvarium is unremarkable. Sinuses/Orbits: Mild mucosal thickening.  Orbits are unremarkable. Other: None. IMPRESSION: No acute intracranial hemorrhage, mass effect, or evidence of acute infarction. Advanced chronic microvascular ischemic changes. Electronically Signed   By: Guadlupe SpanishPraneil  Patel M.D.   On: 01/31/2020 13:59   DG Chest Port 1 View  Result Date: 01/31/2020 CLINICAL DATA:  AMS/Spesis/htn/hx MI/ex smoker/hx heart cath Covid + 12/2019-retested 2 weeks later (per family) and was neg retesting todayQuestionable sepsis - evaluate for abnormality EXAM: PORTABLE CHEST 1 VIEW COMPARISON:  01/18/2020 FINDINGS: Enlarged cardiac silhouette. Low lung volumes. No effusion, infiltrate, or pneumothorax. Mild LEFT basilar atelectasis. IMPRESSION: 1. No interval change. 2. Low lung volumes. 3. Mild LEFT basilar atelectasis. Electronically Signed   By: Genevive BiStewart  Edmunds M.D.   On: 01/31/2020 12:33     CBC Recent Labs  Lab 01/31/20 1147 02/02/20 0448  WBC 18.2* 8.8  HGB 10.5* 8.2*  HCT 32.9* 26.6*  PLT 479* 294  MCV 99.4 101.1*  MCH 31.7 31.2  MCHC 31.9 30.8  RDW 13.6 13.4  LYMPHSABS 1.7  --   MONOABS 1.2*  --   EOSABS 0.2  --   BASOSABS 0.0  --      Chemistries  Recent Labs  Lab 01/31/20 1147 02/01/20 0517 02/02/20 0448  NA 133* 136 134*  K 4.0 3.7 3.5  CL 101 105 105  CO2 18* 23 20*  GLUCOSE 202* 114* 116*  BUN 63* 46* 32*  CREATININE 2.52*  1.55* 1.30*  CALCIUM 8.9 8.3* 7.9*  AST 41 44*  --   ALT 58* 61*  --   ALKPHOS 101 95  --   BILITOT 1.1 0.9  --    ------------------------------------------------------------------------------------------------------------------ No results for input(s): CHOL, HDL, LDLCALC, TRIG, CHOLHDL, LDLDIRECT in the last 72 hours.  No results found for: HGBA1C ------------------------------------------------------------------------------------------------------------------ No results for input(s): TSH, T4TOTAL, T3FREE, THYROIDAB in the last 72 hours.  Invalid input(s): FREET3 ------------------------------------------------------------------------------------------------------------------ Recent Labs    01/31/20 1147  VITAMINB12 218  FOLATE 11.5  FERRITIN 859*  TIBC 221*  IRON 25*    Coagulation profile Recent Labs  Lab 01/31/20 1147  INR 1.6*    No results for input(s): DDIMER in the last 72 hours.  Cardiac Enzymes No results for input(s): CKMB, TROPONINI, MYOGLOBIN in the last 168 hours.  Invalid input(s): CK ------------------------------------------------------------------------------------------------------------------ No results found for: BNP   Shon Hale M.D on 02/02/2020 at 6:19 PM  Go to [email or web address] - for contact info  [facility] - Office  [phone]

## 2020-02-02 NOTE — Care Management Important Message (Signed)
Important Message  Patient Details  Name: Ronnie Gregory MRN: [id number] Date of Birth: 1934/01/16   Medicare Important Message Given:  Yes     Corey Harold 02/02/2020, 1:10 PM

## 2020-02-02 NOTE — TOC Initial Note (Signed)
Transition of Care [facility]) - Initial/Assessment Note    Patient Details  Name: Ronnie Gregory MRN: [id number] Date of Birth: [birth year]  Transition of Care [facility]) CM/SW Contact:    Elliot Gault, LCSW Phone Number: 02/02/2020, 11:15 AM  Clinical Narrative:                  Received TOC consult for SNF at dc. Pt was recently at a SNF in [place] after being hospitalized at [facility] for COVID. Pt's daughter took pt out of the SNF due to family impression that his care was inadequate per MD. Sherron Monday with pt's daughter, Arline Asp, by phone today to update. Per Arline Asp, they will consider as a family whether they will want SNF rehab at dc or not depending on how pt is doing here in the hospital as stay progresses. Pt has not worked with PT yet.   TOC will follow up once pt is more stable and PT recommendations are entered.   Expected Discharge Plan: Skilled Nursing Facility Barriers to Discharge: Continued Medical Work up   Patient Goals and CMS Choice        Expected Discharge Plan and Services Expected Discharge Plan: Skilled Nursing Facility In-house Referral: Clinical Social Work                                            Prior Living Arrangements/Services   Lives with:: Adult Children Patient language and need for interpreter reviewed:: Yes Do you feel safe going back to the place where you live?: Yes      Need for Family Participation in Patient Care: Yes (Comment) Care giver support system in place?: Yes (comment)   Criminal Activity/Legal Involvement Pertinent to Current Situation/Hospitalization: No - Comment as needed  Activities of Daily Living Home Assistive Devices/Equipment: Oxygen,Hospital bed ADL Screening (condition at time of admission) Patient's cognitive ability adequate to safely complete daily activities?: No Is the patient deaf or have difficulty hearing?: No Does the patient have difficulty seeing, even when wearing glasses/contacts?: No Does the  patient have difficulty concentrating, remembering, or making decisions?: Yes Patient able to express need for assistance with ADLs?: No Does the patient have difficulty dressing or bathing?: Yes Independently performs ADLs?: No Communication: Independent Dressing (OT): Needs assistance Is this a change from baseline?: Pre-admission baseline Grooming: Needs assistance Is this a change from baseline?: Pre-admission baseline Feeding: Needs assistance Is this a change from baseline?: Pre-admission baseline Bathing: Needs assistance Is this a change from baseline?: Pre-admission baseline Toileting: Needs assistance Is this a change from baseline?: Pre-admission baseline In/Out Bed: Needs assistance Is this a change from baseline?: Pre-admission baseline Walks in Home: Needs assistance Is this a change from baseline?: Pre-admission baseline Does the patient have difficulty walking or climbing stairs?: Yes Weakness of Legs: Both Weakness of Arms/Hands: None  Permission Sought/Granted                  Emotional Assessment       Orientation: : Oriented to Self Alcohol / Substance Use: Not Applicable Psych Involvement: No (comment)  Admission diagnosis:  Hypoxemia [R09.02] SIRS (systemic inflammatory response syndrome) (HCC) [R65.10] Hypotension, unspecified hypotension type [I95.9] Sepsis, due to unspecified organism, unspecified whether acute organ dysfunction present [facility]) [A41.9] Patient Active Problem List   Diagnosis Date Noted  . SIRS (systemic inflammatory response syndrome) (HCC) 01/31/2020  . Sepsis secondary to  UTI (HCC) 01/31/2020  . H/o COVID-19 virus infection--+ve 01/09/20 , negative 01/25/20 (vaccinated Fall of 2021) 01/31/2020  . Acute metabolic encephalopathy  01/31/2020  . CRI (chronic renal insufficiency), stage 3 (moderate) (HCC) 05/28/2017  . Carotid artery disease (HCC) 05/28/2017  . Pseudoarthrosis of lumbar spine 03/05/2015  . Spinal stenosis,  lumbar region, with neurogenic claudication 10/16/2014  . CAD (coronary artery disease) 12/02/2012  . S/P coronary artery stent placement 12/02/2012   PCP:  Ignatius Specking, MD Pharmacy:   [facility] - [place], [place] - [address] [place] [address] Phone: [phone] Fax: [phone]     Social Determinants of Health (SDOH) Interventions    Readmission Risk Interventions Readmission Risk Prevention Plan 02/02/2020  Medication Screening Complete  Transportation Screening Complete  Some recent data might be hidden

## 2020-02-03 DIAGNOSIS — R6510 Systemic inflammatory response syndrome (SIRS) of non-infectious origin without acute organ dysfunction: Secondary | ICD-10-CM | POA: Diagnosis not present

## 2020-02-03 LAB — CBC
HCT: 27.1 % — ABNORMAL LOW (ref 39.0–52.0)
Hemoglobin: 8.5 g/dL — ABNORMAL LOW (ref 13.0–17.0)
MCH: 31.8 pg (ref 26.0–34.0)
MCHC: 31.4 g/dL (ref 30.0–36.0)
MCV: 101.5 fL — ABNORMAL HIGH (ref 80.0–100.0)
Platelets: 338 10*3/uL (ref 150–400)
RBC: 2.67 MIL/uL — ABNORMAL LOW (ref 4.22–5.81)
RDW: 13.3 % (ref 11.5–15.5)
WBC: 9.4 10*3/uL (ref 4.0–10.5)
nRBC: 0 % (ref 0.0–0.2)

## 2020-02-03 LAB — BASIC METABOLIC PANEL
Anion gap: 9 (ref 5–15)
BUN: 22 mg/dL (ref 8–23)
CO2: 21 mmol/L — ABNORMAL LOW (ref 22–32)
Calcium: 8 mg/dL — ABNORMAL LOW (ref 8.9–10.3)
Chloride: 103 mmol/L (ref 98–111)
Creatinine, Ser: 1.26 mg/dL — ABNORMAL HIGH (ref 0.61–1.24)
GFR, Estimated: 56 mL/min — ABNORMAL LOW (ref >60)
Glucose, Bld: 131 mg/dL — ABNORMAL HIGH (ref 70–99)
Potassium: 3.3 mmol/L — ABNORMAL LOW (ref 3.5–5.1)
Sodium: 133 mmol/L — ABNORMAL LOW (ref 135–145)

## 2020-02-03 LAB — GLUCOSE, CAPILLARY
Glucose-Capillary: 117 mg/dL — ABNORMAL HIGH (ref 70–99)
Glucose-Capillary: 109 mg/dL — ABNORMAL HIGH (ref 70–99)
Glucose-Capillary: 122 mg/dL — ABNORMAL HIGH (ref 70–99)

## 2020-02-03 MED ORDER — POTASSIUM CHLORIDE CRYS ER 20 MEQ PO TBCR
40.0000 meq | EXTENDED_RELEASE_TABLET | ORAL | Status: AC
  Administered 2020-02-03 (×2): 40 meq via ORAL
  Filled 2020-02-03 (×2): qty 2

## 2020-02-03 NOTE — Progress Notes (Signed)
Patient Demographics:    Ronnie Gregory, is a 87 y.o. male, DOB - [birth year], HQP:[id number]  Admit date - 01/31/2020   Admitting Physician Khyle Goodell Mariea Clonts, MD  Outpatient Primary MD for the patient is Ignatius Specking, MD  LOS - 3   Chief Complaint  Patient presents with  . Altered Mental Status        Subjective:    Ronnie Gregory today has no fevers, no emesis,  No chest pain,   -No new complaints, oral intake is inconsistent -Daughter at bedside  Assessment  & Plan :    Principal Problem:   SIRS (systemic inflammatory response syndrome) (HCC) Active Problems:   Acute metabolic encephalopathy    H/o COVID-19 virus infection--+ve 01/09/20 , negative 01/25/20 (vaccinated Fall of 2021)   CAD (coronary artery disease)   S/P coronary artery stent placement  Brief Summary:- 87 y.o. male with past medical history relevant for HTN, recent COVID-19 infection, CAD with prior stents in 2012, hypothyroidism, DM 2, CKD 3B who presents to the ED from SNF due to worsening altered mental status, admitted on 01/31/2020 with SIRS pathophysiology   A/p  1)SIRS-- ?? source--  Lactic Acid 2.3 >>>1.7 after IVF WBC  18.2>>9.4 Covid Neg -- May discontinue IV cefepime on 02/03/2020 if blood and urine cultures remain negative  --Initially septic source suspected on admission, however patient did NOT rule in for sepsis SIRS pathophysiology continues to improve  2) dehydration with Aki on CKD 3B--- on admission creatinine was 2.52 , baseline 1.3 to 1.5 -Worsening renal function due to dehydration and the patient with altered mentation and poor intake, compounded by lisinopril use -, renally adjust medications, avoid nephrotoxic agents / dehydration  / hypotension -Stopped lisinopril -Creatinine improved to 1.26 with hydration  3)Acute Metabolic Encephalopathy--- most likely due to #1 and #2 above compounded by  opiate and muscle relaxant to use -Overall much improved with avoidance of excessive opiates and treatment of  dehydration/AKI  4) iron deficiency anemia ---Hgb 10.5>>.8.2----acute Vs acute on chronic -Anemia work-up requested including stool occult blood - patient was recently started on Eliquis -Anemia work-up reveals iron deficiency with a serum iron of 25 and iron saturation of 11% -Ferritin is not low -B12 and folate WNL -Suspect some of patient's drop in hemoglobin is due to hemodilution  5)DM2--- Recent A1C is 7.3 Use Novolog/Humalog Sliding scale insulin with Accu-Cheks/Fingersticks as ordered  6)CAD-- NSTEMI and stents in 2012----no chest pains, hold metoprolol due to hypotension, -Continue pravastatin, no aspirin due to Eliquis use   7)Recent Covid -19 Infection--- tested positive 01/09/20, tested Negative 1 week  ago and Negative again 01/31/20 -Previously completed Vaccination in early fall 2021 (not boosted) -Does not appear to have any acute/active COVID-19 symptoms at this time  8)Social/Ethics--Full code, with full scope of treatment  Discussed with daughter Ronnie Gregory [phone] is POA)  9)HTN-hold lisinopril and metoprolol due to soft BP in the setting of dehydration and AKI   10)Hypothyroidism --continue levothyroxine 75 mcg daily  11) urinary retention-- -Please note the patient had a Foley catheter placed around 01/26/2019 at the rehab facility in [place] apparently due to urinary retention -BP is too soft to allow for initiation of Flomax at this time Foley catheter changed on  02/02/2020  12)HypoNatrenia--improved with IV fluid, Na is 133  13) ambulatory dysfunction/generalized weakness--- PT eval requested, anticipate patient will require SNF level rehab  --DVT prophylaxis--patient was recently started on low-dose Eliquis apparently post-COVID due to concerns for hypercoagulable state, will consider discontinuing Eliquis when patient becomes  ambulatory  Disposition/Need for in-Hospital Stay- patient unable to be discharged at this time due to --- SIRS pathophysiology requiring IV fluids and IV antibiotics pending culture data  Status is: Inpatient  Remains inpatient appropriate because:Please see above   Dispo: The patient is from: SNF  Anticipated d/c is to: SNF  Anticipated d/c date is: 2 days  Patient currently is not medically stable to d/c. Barriers: Not Clinically Stable-    Code Status :  -  Code Status: Full Code   Family Communication:    (patient is alert, awake and coherent)  Discussed with with POA on 02/03/20  Consults  :  na  DVT Prophylaxis  :   - SCDs  apixaban (ELIQUIS) tablet 2.5 mg Start: 01/31/20 2200 SCDs Start: 01/31/20 1546 Place TED hose Start: 01/31/20 1546 apixaban (ELIQUIS) tablet 2.5 mg    Lab Results  Component Value Date   PLT 338 02/03/2020    Inpatient Medications  Scheduled Meds: . apixaban  2.5 mg Oral BID  . Chlorhexidine Gluconate Cloth  6 each Topical Daily  . insulin aspart  0-5 Units Subcutaneous QHS  . insulin aspart  0-6 Units Subcutaneous TID WC  . levothyroxine  75 mcg Oral QAC breakfast  . pantoprazole (PROTONIX) IV  40 mg Intravenous Q24H  . potassium chloride  40 mEq Oral Q3H  . pravastatin  40 mg Oral QHS  . sodium chloride flush  3 mL Intravenous Q12H  . sodium chloride flush  3 mL Intravenous Q12H   Continuous Infusions: . sodium chloride    . ceFEPime (MAXIPIME) IV 2 g (02/03/20 1052)   PRN Meds:.sodium chloride, acetaminophen **OR** acetaminophen, bisacodyl, HYDROcodone-acetaminophen, ondansetron **OR** ondansetron (ZOFRAN) IV, polyethylene glycol, sodium chloride flush    Anti-infectives (From admission, onward)   Start     Dose/Rate Route Frequency Ordered Stop   02/01/20 1000  ceFEPIme (MAXIPIME) 2 g in sodium chloride 0.9 % 100 mL IVPB        2 g 200 mL/hr over 30 Minutes Intravenous Every 12 hours  02/01/20 0832 02/03/20 2359   01/31/20 1800  ceFEPIme (MAXIPIME) 2 g in sodium chloride 0.9 % 100 mL IVPB  Status:  Discontinued        2 g 200 mL/hr over 30 Minutes Intravenous Every 24 hours 01/31/20 1554 02/01/20 0832   01/31/20 1215  piperacillin-tazobactam (ZOSYN) IVPB 3.375 g        3.375 g 100 mL/hr over 30 Minutes Intravenous  Once 01/31/20 1203 01/31/20 1302   01/31/20 1215  vancomycin (VANCOCIN) IVPB 1000 mg/200 mL premix        1,000 mg 200 mL/hr over 60 Minutes Intravenous  Once 01/31/20 1203 01/31/20 1440        Objective:   Vitals:   02/03/20 1134 02/03/20 1200 02/03/20 1300 02/03/20 1333  BP:  121/63 (!) 142/78   Pulse:  92 91 63  Resp: (!) 30 (!) 32 (!) 27 (!) 21  Temp: 98.5 F (36.9 C)     TempSrc: Oral     SpO2:  100% 100% 100%  Weight:      Height:        Wt Readings from Last 3 Encounters:  02/02/20  89 kg  03/30/19 95.3 kg  08/27/17 88.5 kg     Intake/Output Summary (Last 24 hours) at 02/03/2020 1429 Last data filed at 02/03/2020 1020 Gross per 24 hour  Intake 103 ml  Output 950 ml  Net -847 ml    Physical Exam  Gen:- Awake Alert, more talkative HEENT:- [place].AT, No sclera icterus Ears-very HOH  Neck-Supple Neck,No JVD,.  Lungs-  CTAB , fair symmetrical air movement CV- S1, S2 normal, irregular  Abd-  +ve B.Sounds, Abd Soft, No tenderness, no CVA area tenderness Extremity/Skin:- No  edema, pedal pulses present  Psych-affect is appropriate, oriented x3 Neuro-generalized weakness, no new focal deficits, no tremors GU-Foley catheter in situ (changed 02/02/2020)   Data Review:   Micro Results Recent Results (from the past 240 hour(s))  Resp Panel by RT-PCR (Flu A&B, Covid) Nasopharyngeal Swab     Status: None   Collection Time: 01/31/20 11:45 AM   Specimen: Nasopharyngeal Swab; Nasopharyngeal(NP) swabs in vial transport medium  Result Value Ref Range Status   SARS Coronavirus 2 by RT PCR NEGATIVE NEGATIVE Final    Comment: (NOTE) SARS-CoV-2  target nucleic acids are NOT DETECTED.  The SARS-CoV-2 RNA is generally detectable in upper respiratory specimens during the acute phase of infection. The lowest concentration of SARS-CoV-2 viral copies this assay can detect is 138 copies/mL. A negative result does not preclude SARS-Cov-2 infection and should not be used as the sole basis for treatment or other patient management decisions. A negative result may occur with  improper specimen collection/handling, submission of specimen other than nasopharyngeal swab, presence of viral mutation(s) within the areas targeted by this assay, and inadequate number of viral copies(<138 copies/mL). A negative result must be combined with clinical observations, patient history, and epidemiological information. The expected result is Negative.  Fact Sheet for Patients:  BloggerCourse.comhttps://www.fda.gov/media/152166/download  Fact Sheet for Healthcare Providers:  SeriousBroker.ithttps://www.fda.gov/media/152162/download  This test is no t yet approved or cleared by the [place] FDA and  has been authorized for detection and/or diagnosis of SARS-CoV-2 by FDA under an Emergency Use Authorization (EUA). This EUA will remain  in effect (meaning this test can be used) for the duration of the COVID-19 declaration under Section 564(b)(1) of the Act, 21 U.S.C.section 360bbb-3(b)(1), unless the authorization is terminated  or revoked sooner.       Influenza A by PCR NEGATIVE NEGATIVE Final   Influenza B by PCR NEGATIVE NEGATIVE Final    Comment: (NOTE) The Xpert Xpress SARS-CoV-2/FLU/RSV plus assay is intended as an aid in the diagnosis of influenza from Nasopharyngeal swab specimens and should not be used as a sole basis for treatment. Nasal washings and aspirates are unacceptable for Xpert Xpress SARS-CoV-2/FLU/RSV testing.  Fact Sheet for Patients: BloggerCourse.comhttps://www.fda.gov/media/152166/download  Fact Sheet for Healthcare  Providers: SeriousBroker.ithttps://www.fda.gov/media/152162/download  This test is not yet approved or cleared by the [place] FDA and has been authorized for detection and/or diagnosis of SARS-CoV-2 by FDA under an Emergency Use Authorization (EUA). This EUA will remain in effect (meaning this test can be used) for the duration of the COVID-19 declaration under Section 564(b)(1) of the Act, 21 U.S.C. section 360bbb-3(b)(1), unless the authorization is terminated or revoked.  Performed at [facility], [address] [place], [place] [address]   Blood Culture (routine x 2)     Status: None (Preliminary result)   Collection Time: 01/31/20 11:47 AM   Specimen: BLOOD  Result Value Ref Range Status   Specimen Description BLOOD RIGHT ANTECUBITAL  Final  Special Requests   Final    Blood Culture adequate volume BOTTLES DRAWN AEROBIC AND ANAEROBIC   Culture   Final    NO GROWTH 3 DAYS Performed at [facility], [address] [place], [place] [address]    Report Status PENDING  Incomplete  Blood Culture (routine x 2)     Status: None (Preliminary result)   Collection Time: 01/31/20 11:47 AM   Specimen: BLOOD  Result Value Ref Range Status   Specimen Description BLOOD LEFT ANTECUBITAL  Final   Special Requests   Final    Blood Culture adequate volume BOTTLES DRAWN AEROBIC AND ANAEROBIC   Culture   Final    NO GROWTH 3 DAYS Performed at [facility], [address] [place], [place] [address]    Report Status PENDING  Incomplete  Urine culture     Status: None   Collection Time: 01/31/20 12:35 PM   Specimen: Urine, Catheterized  Result Value Ref Range Status   Specimen Description   Final    URINE, CATHETERIZED Performed at [facility], [address] [place], [place] [address]    Special Requests   Final    NONE Performed at [facility], [address] [place], [place] [address]    Culture   Final    NO GROWTH Performed at [facility], [address] [place], [place]  [address]    Report Status 02/02/2020 FINAL  Final    Radiology Reports CT Head Wo Contrast  Result Date: 01/31/2020 CLINICAL DATA:  Altered mental status EXAM: CT HEAD WITHOUT CONTRAST TECHNIQUE: Contiguous axial images were obtained from the base of the skull through the vertex without intravenous contrast. COMPARISON:  01/09/2020 FINDINGS: Brain: There is no acute intracranial hemorrhage, mass effect, or edema. Gray-white differentiation is preserved. There is no extra-axial fluid collection. Prominence of the ventricles and sulci reflects stable generalized parenchymal volume loss. Patchy and confluent areas of hypoattenuation in the supratentorial white matter nonspecific but probably reflects stable advanced chronic microvascular ischemic changes. Vascular: There is atherosclerotic calcification at the skull base. Skull: Calvarium is unremarkable. Sinuses/Orbits: Mild mucosal thickening.  Orbits are unremarkable. Other: None. IMPRESSION: No acute intracranial hemorrhage, mass effect, or evidence of acute infarction. Advanced chronic microvascular ischemic changes. Electronically Signed   By: Guadlupe SpanishPraneil  Patel M.D.   On: 01/31/2020 13:59   DG Chest Port 1 View  Result Date: 01/31/2020 CLINICAL DATA:  AMS/Spesis/htn/hx MI/ex smoker/hx heart cath Covid + 12/2019-retested 2 weeks later (per family) and was neg retesting todayQuestionable sepsis - evaluate for abnormality EXAM: PORTABLE CHEST 1 VIEW COMPARISON:  01/18/2020 FINDINGS: Enlarged cardiac silhouette. Low lung volumes. No effusion, infiltrate, or pneumothorax. Mild LEFT basilar atelectasis. IMPRESSION: 1. No interval change. 2. Low lung volumes. 3. Mild LEFT basilar atelectasis. Electronically Signed   By: Genevive BiStewart  Edmunds M.D.   On: 01/31/2020 12:33     CBC Recent Labs  Lab 01/31/20 1147 02/02/20 0448 02/03/20 0450  WBC 18.2* 8.8 9.4  HGB 10.5* 8.2* 8.5*  HCT 32.9* 26.6* 27.1*  PLT 479* 294 338  MCV 99.4 101.1* 101.5*  MCH 31.7 31.2  31.8  MCHC 31.9 30.8 31.4  RDW 13.6 13.4 13.3  LYMPHSABS 1.7  --   --   MONOABS 1.2*  --   --   EOSABS 0.2  --   --   BASOSABS 0.0  --   --     Chemistries  Recent Labs  Lab 01/31/20 1147 02/01/20 0517 02/02/20 0448 02/03/20 0450  NA 133* 136 134* 133*  K 4.0 3.7 3.5 3.3*  CL 101 105 105 103  CO2 18* 23 20* 21*  GLUCOSE 202* 114* 116* 131*  BUN 63* 46* 32* 22  CREATININE 2.52* 1.55* 1.30* 1.26*  CALCIUM 8.9 8.3* 7.9* 8.0*  AST 41 44*  --   --   ALT 58* 61*  --   --   ALKPHOS 101 95  --   --   BILITOT 1.1 0.9  --   --    ------------------------------------------------------------------------------------------------------------------ No results for input(s): CHOL, HDL, LDLCALC, TRIG, CHOLHDL, LDLDIRECT in the last 72 hours.  No results found for: HGBA1C ------------------------------------------------------------------------------------------------------------------ No results for input(s): TSH, T4TOTAL, T3FREE, THYROIDAB in the last 72 hours.  Invalid input(s): FREET3 ------------------------------------------------------------------------------------------------------------------ No results for input(s): VITAMINB12, FOLATE, FERRITIN, TIBC, IRON, RETICCTPCT in the last 72 hours.  Coagulation profile Recent Labs  Lab 01/31/20 1147  INR 1.6*    No results for input(s): DDIMER in the last 72 hours.  Cardiac Enzymes No results for input(s): CKMB, TROPONINI, MYOGLOBIN in the last 168 hours.  Invalid input(s): CK ------------------------------------------------------------------------------------------------------------------ No results found for: BNP   Shon Hale M.D on 02/03/2020 at 2:29 PM  Go to [email or web address] - for contact info  [facility] - Office  [phone]

## 2020-02-03 NOTE — Progress Notes (Signed)
Pharmacy Antibiotic Note  Ronnie Gregory is a 87 y.o. male admitted on 01/31/2020 with sepsis.  Pharmacy has been consulted for Cefepime dosing.  Plan: Cefepime 2000 mg IV every 24 hours. Monitor labs, c/s, and patient improvement.  Height: 6' (182.9 cm) Weight: 89 kg (196 lb 3.4 oz) IBW/kg (Calculated) : 77.6  Temp (24hrs), Avg:98.8 F (37.1 C), Min:97.8 F (36.6 C), Max:99.6 F (37.6 C)  Recent Labs  Lab 01/31/20 1147 01/31/20 1408 02/01/20 0517 02/02/20 0448 02/03/20 0450  WBC 18.2*  --   --  8.8 9.4  CREATININE 2.52*  --  1.55* 1.30* 1.26*  LATICACIDVEN 2.3* 1.7  --   --   --     Estimated Creatinine Clearance: 47.9 mL/min (A) (by C-G formula based on SCr of 1.26 mg/dL (H)).    Allergies  Allergen Reactions  . Hydrocodone     Other reaction(s): Confusion, Hallucinations  . Mobic [Meloxicam] Other (See Comments)    Mucus    Antimicrobials this admission: Cefepime 1/12 >>  Vanco 1/12 x 1    Microbiology results: 1/12 BCx: NGTD 1/12 UCx: NG   Thank you for allowing pharmacy to be a part of this patient's care.  Ronnie Gregory 02/03/2020 1:39 PM

## 2020-02-04 DIAGNOSIS — R6510 Systemic inflammatory response syndrome (SIRS) of non-infectious origin without acute organ dysfunction: Secondary | ICD-10-CM | POA: Diagnosis not present

## 2020-02-04 LAB — GLUCOSE, CAPILLARY: Glucose-Capillary: 95 mg/dL (ref 70–99)

## 2020-02-04 NOTE — Evaluation (Signed)
Physical Therapy Evaluation Patient Details Name: Ronnie Gregory MRN: [id number] DOB: Oct 28, 1933 Today's Date: 02/04/2020   History of Present Illness  Ronnie Gregory  is a 87 y.o. male with past medical history relevant for HTN, recent COVID-19 infection, CAD with prior stents in 2012, hypothyroidism, DM 2, CKD 3B who presents to the ED from SNF due to worsening altered mental status    Clinical Impression  Patient presents slightly lethargic and requires Max verbal/tactile cueing to participate with therapy.  Patient demonstrates slow labored movement for sitting up at bedside with c/o severe pain BLE with movement, poor grip strength with right hand and unable to hold onto RW, frequent falling over to the right when sitting and standing, at severe risk for falls and unable to transfer to chair due to weakness.  Patient put back to bed with Max/total assist to reposition after therapy.  Patient will benefit from continued physical therapy in hospital and recommended venue below to increase strength, balance, endurance for safe ADLs and gait.     Follow Up Recommendations SNF    Equipment Recommendations  None recommended by PT    Recommendations for Other Services       Precautions / Restrictions Precautions Precautions: Fall Restrictions Weight Bearing Restrictions: No      Mobility  Bed Mobility Overal bed mobility: Needs Assistance Bed Mobility: Supine to Sit;Sit to Supine     Supine to sit: Max assist Sit to supine: Max assist   General bed mobility comments: slow labored movement with c/o severe pain with movement of BLE    Transfers Overall transfer level: Needs assistance Equipment used: Rolling walker (2 wheeled);1 person hand held assist Transfers: Sit to/from Stand Sit to Stand: Max assist         General transfer comment: limited for standing due to BLE weakness and poor grip strength right hand possible due to 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  Ambulation/Gait                Stairs            Wheelchair Mobility    Modified Rankin (Stroke Patients Only)       Balance Overall balance assessment: Needs assistance Sitting-balance support: Feet supported;No upper extremity supported Sitting balance-Leahy Scale: Poor Sitting balance - Comments: frequent leaning to the right Postural control: Right lateral lean Standing balance support: During functional activity;Bilateral upper extremity supported Standing balance-Leahy Scale: Poor Standing balance comment: using RW                             Pertinent Vitals/Pain Pain Assessment: Faces Faces Pain Scale: Hurts even more Pain Location: with movement of BLE, pressure to feet Pain Descriptors / Indicators: Grimacing;Guarding;Sore Pain Intervention(s): Limited activity within patient's tolerance;Monitored during session;Repositioned    Home Living Family/patient expects to be discharged to:: Private residence Living Arrangements: Alone Available Help at Discharge: Family;Available 24 hours/day Type of Home: House Home Access: Level entry     Home Layout: Able to live on main level with bedroom/bathroom;Two level Home Equipment: Walker - 2 wheels;Cane - single point;Bedside commode;Wheelchair - manual;Tub bench;Hand held shower head;Adaptive equipment Additional Comments: Patient is poor historian, info taken from previous admission    Prior Function Level of Independence: Needs assistance   Gait / Transfers Assistance Needed: Patient is poor historian           Hand Dominance   Dominant Hand: Left  Extremity/Trunk Assessment   Upper Extremity Assessment Upper Extremity Assessment: Generalized weakness    Lower Extremity Assessment Lower Extremity Assessment: Generalized weakness    Cervical / Trunk Assessment Cervical / Trunk Assessment: Normal  Communication   Communication: Other (comment) (patient  non-verbal other than c/o pain with movement)  Cognition Arousal/Alertness: Awake/alert Behavior During Therapy: WFL for tasks assessed/performed Overall Cognitive Status: No family/caregiver present to determine baseline cognitive functioning                                        General Comments      Exercises     Assessment/Plan    PT Assessment Patient needs continued PT services  PT Problem List Decreased strength;Decreased activity tolerance;Decreased balance;Decreased mobility       PT Treatment Interventions Balance training;DME instruction;Gait training;Stair training;Functional mobility training;Therapeutic activities;Therapeutic exercise;Patient/family education    PT Goals (Current goals can be found in the Care Plan section)  Acute Rehab PT Goals Patient Stated Goal: not stated PT Goal Formulation: With patient Time For Goal Achievement: 02/18/20 Potential to Achieve Goals: Fair    Frequency Min 3X/week   Barriers to discharge        Co-evaluation               AM-PAC PT "6 Clicks" Mobility  Outcome Measure Help needed turning from your back to your side while in a flat bed without using bedrails?: A Lot Help needed moving from lying on your back to sitting on the side of a flat bed without using bedrails?: A Lot Help needed moving to and from a bed to a chair (including a wheelchair)?: Total Help needed standing up from a chair using your arms (e.g., wheelchair or bedside chair)?: A Lot Help needed to walk in hospital room?: Total Help needed climbing 3-5 steps with a railing? : Total 6 Click Score: 9    End of Session   Activity Tolerance: Patient tolerated treatment well;Patient limited by fatigue;Patient limited by pain Patient left: in bed;with call bell/phone within reach;with bed alarm set Nurse Communication: Mobility status PT Visit Diagnosis: Unsteadiness on feet (R26.81);Other abnormalities of gait and mobility  (R26.89);Muscle weakness (generalized) (M62.81)    Time: 1829-9371 PT Time Calculation (min) (ACUTE ONLY): 31 min   Charges:   PT Evaluation $PT Eval Moderate Complexity: 1 Mod PT Treatments $Therapeutic Activity: 23-37 mins        10:09 AM, 02/04/20 Ocie Bob, MPT Physical Therapist with [facility] 336 [phone] office [phone] mobile phone

## 2020-02-04 NOTE — Progress Notes (Signed)
Patient Demographics:    Ronnie Gregory, is a 87 y.o. male, DOB - Jun 15, 1933, WUJ:[id number]  Admit date - 01/31/2020   Admitting Physician Taiyana Kissler Mariea Clonts, MD  Outpatient Primary MD for the patient is Ignatius Specking, MD  LOS - 4   Chief Complaint  Patient presents with  . Altered Mental Status        Subjective:    Ronnie Gregory today has no fevers, no emesis,  No chest pain,   - Oral intake is fair -Answers questions appropriately  Assessment  & Plan :    Principal Problem:   SIRS (systemic inflammatory response syndrome) (HCC) Active Problems:   Acute metabolic encephalopathy    H/o COVID-19 virus infection--+ve 01/09/20 , negative 01/25/20 (vaccinated Fall of 2021)   CAD (coronary artery disease)   S/P coronary artery stent placement  Brief Summary:- 87 y.o. male with past medical history relevant for HTN, recent COVID-19 infection, CAD with prior stents in 2012, hypothyroidism, DM 2, CKD 3B who presents to the ED from SNF due to worsening altered mental status, admitted on 01/31/2020 with SIRS pathophysiology   A/p  1)SIRS-- ?? source--  Lactic Acid 2.3 >>>1.7 after IVF WBC  18.2>>9.4 Covid Neg -- May discontinue IV cefepime on 02/03/2020 if blood and urine cultures remain negative  --Initially septic source suspected on admission, however patient did NOT rule in for sepsis SIRS pathophysiology has resolved  2) dehydration with Aki on CKD 3B--- on admission creatinine was 2.52 , baseline 1.3 to 1.5 -Worsening renal function due to dehydration and the patient with altered mentation and poor intake, compounded by lisinopril use -, renally adjust medications, avoid nephrotoxic agents / dehydration  / hypotension -Stopped lisinopril -Creatinine improved to 1.26 with hydration  3)Acute Metabolic Encephalopathy--- most likely due to #1 and #2 above compounded by opiate and muscle  relaxant to use -Overall much improved with avoidance of excessive opiates and treatment of  dehydration/AKI  4) iron deficiency anemia ---Hgb 10.5>>.8.2----acute Vs acute on chronic -Anemia work-up requested including stool occult blood - patient was recently started on Eliquis -Anemia work-up reveals iron deficiency with a serum iron of 25 and iron saturation of 11% -Ferritin is not low -B12 and folate WNL -Suspect some of patient's drop in hemoglobin is due to hemodilution  5)DM2--- Recent A1C is 7.3 Use Novolog/Humalog Sliding scale insulin with Accu-Cheks/Fingersticks as ordered  6)CAD-- NSTEMI and stents in 2012----no chest pains, hold metoprolol due to hypotension, -Continue pravastatin, no aspirin due to Eliquis use   7)Recent Covid -19 Infection--- tested positive 01/09/20, tested Negative 1 week  ago and Negative again 01/31/20 -Previously completed Vaccination in early fall 2021 (not boosted) -Does not appear to have any acute/active COVID-19 symptoms at this time  8)Social/Ethics--Full code, with full scope of treatment  Discussed with daughter [name] [phone] is POA)  9)HTN-hold lisinopril and metoprolol due to soft BP in the setting of dehydration and AKI   10)Hypothyroidism --continue levothyroxine 75 mcg daily  11) urinary retention-- -Please note the patient had a Foley catheter placed around 01/26/2019 at the rehab facility in [place] apparently due to urinary retention -BP is too soft to allow for initiation of Flomax at this time Foley catheter changed on 02/02/2020  12)HypoNatrenia--improved  with IV fluid, Na is 133  13) ambulatory dysfunction/generalized weakness--- PT eval appreciated recommends SNF.   --DVT prophylaxis--patient was recently started on low-dose Eliquis apparently post-COVID due to concerns for hypercoagulable state, will consider discontinuing Eliquis when patient becomes ambulatory  Disposition/Need for in-Hospital Stay-  patient unable to be discharged at this time due to ---  awaiting transfer to SNF rehab  Status is: Inpatient  Remains inpatient appropriate because:Please see above   Dispo: The patient is from: SNF  Anticipated d/c is to: SNF  Anticipated d/c date is: 1 days  Patient currently is  medically stable to d/c--- awaiting transfer to SNF rehab when bed is available   Code Status :  -  Code Status: Full Code   Family Communication:    (patient is alert, awake and coherent)  Discussed with with POA on 02/03/20  Consults  :  na  DVT Prophylaxis  :   - SCDs  apixaban (ELIQUIS) tablet 2.5 mg Start: 01/31/20 2200 SCDs Start: 01/31/20 1546 Place TED hose Start: 01/31/20 1546 apixaban (ELIQUIS) tablet 2.5 mg    Lab Results  Component Value Date   PLT 338 02/03/2020    Inpatient Medications  Scheduled Meds: . apixaban  2.5 mg Oral BID  . Chlorhexidine Gluconate Cloth  6 each Topical Daily  . insulin aspart  0-5 Units Subcutaneous QHS  . insulin aspart  0-6 Units Subcutaneous TID WC  . levothyroxine  75 mcg Oral QAC breakfast  . pantoprazole (PROTONIX) IV  40 mg Intravenous Q24H  . pravastatin  40 mg Oral QHS  . sodium chloride flush  3 mL Intravenous Q12H  . sodium chloride flush  3 mL Intravenous Q12H   Continuous Infusions: . sodium chloride     PRN Meds:.sodium chloride, acetaminophen **OR** acetaminophen, bisacodyl, HYDROcodone-acetaminophen, ondansetron **OR** ondansetron (ZOFRAN) IV, polyethylene glycol, sodium chloride flush    Anti-infectives (From admission, onward)   Start     Dose/Rate Route Frequency Ordered Stop   02/01/20 1000  ceFEPIme (MAXIPIME) 2 g in sodium chloride 0.9 % 100 mL IVPB        2 g 200 mL/hr over 30 Minutes Intravenous Every 12 hours 02/01/20 0832 02/03/20 2204   01/31/20 1800  ceFEPIme (MAXIPIME) 2 g in sodium chloride 0.9 % 100 mL IVPB  Status:  Discontinued        2 g 200 mL/hr over 30 Minutes  Intravenous Every 24 hours 01/31/20 1554 02/01/20 0832   01/31/20 1215  piperacillin-tazobactam (ZOSYN) IVPB 3.375 g        3.375 g 100 mL/hr over 30 Minutes Intravenous  Once 01/31/20 1203 01/31/20 1302   01/31/20 1215  vancomycin (VANCOCIN) IVPB 1000 mg/200 mL premix        1,000 mg 200 mL/hr over 60 Minutes Intravenous  Once 01/31/20 1203 01/31/20 1440        Objective:   Vitals:   02/04/20 0048 02/04/20 0454 02/04/20 1425 02/04/20 1507  BP: (!) 142/61 (!) 143/97 (!) 171/82 (!) 164/84  Pulse: 82 80 81 83  Resp: 20 18    Temp: 99.9 F (37.7 C) 99.2 F (37.3 C)    TempSrc: Oral     SpO2: 98% 99% 95% 96%  Weight:      Height:        Wt Readings from Last 3 Encounters:  02/02/20 89 kg  03/30/19 95.3 kg  08/27/17 88.5 kg     Intake/Output Summary (Last 24 hours) at 02/04/2020 1711 Last data  filed at 02/04/2020 0900 Gross per 24 hour  Intake 483 ml  Output 1000 ml  Net -517 ml    Physical Exam  Gen:- Awake Alert, more talkative HEENT:- [place].AT, No sclera icterus Ears-very HOH  Neck-Supple Neck,No JVD,.  Lungs-  CTAB , fair symmetrical air movement CV- S1, S2 normal, irregular  Abd-  +ve B.Sounds, Abd Soft, No tenderness, no CVA area tenderness Extremity/Skin:- No  edema, pedal pulses present  Psych-affect is appropriate, oriented x3 Neuro-generalized weakness, no new focal deficits, no tremors GU-Foley catheter in situ (changed 02/02/2020)   Data Review:   Micro Results Recent Results (from the past 240 hour(s))  Resp Panel by RT-PCR (Flu A&B, Covid) Nasopharyngeal Swab     Status: None   Collection Time: 01/31/20 11:45 AM   Specimen: Nasopharyngeal Swab; Nasopharyngeal(NP) swabs in vial transport medium  Result Value Ref Range Status   SARS Coronavirus 2 by RT PCR NEGATIVE NEGATIVE Final    Comment: (NOTE) SARS-CoV-2 target nucleic acids are NOT DETECTED.  The SARS-CoV-2 RNA is generally detectable in upper respiratory specimens during the acute phase of  infection. The lowest concentration of SARS-CoV-2 viral copies this assay can detect is 138 copies/mL. A negative result does not preclude SARS-Cov-2 infection and should not be used as the sole basis for treatment or other patient management decisions. A negative result may occur with  improper specimen collection/handling, submission of specimen other than nasopharyngeal swab, presence of viral mutation(s) within the areas targeted by this assay, and inadequate number of viral copies(<138 copies/mL). A negative result must be combined with clinical observations, patient history, and epidemiological information. The expected result is Negative.  Fact Sheet for Patients:  BloggerCourse.comhttps://www.fda.gov/media/152166/download  Fact Sheet for Healthcare Providers:  SeriousBroker.ithttps://www.fda.gov/media/152162/download  This test is no t yet approved or cleared by the [place] FDA and  has been authorized for detection and/or diagnosis of SARS-CoV-2 by FDA under an Emergency Use Authorization (EUA). This EUA will remain  in effect (meaning this test can be used) for the duration of the COVID-19 declaration under Section 564(b)(1) of the Act, 21 U.S.C.section 360bbb-3(b)(1), unless the authorization is terminated  or revoked sooner.       Influenza A by PCR NEGATIVE NEGATIVE Final   Influenza B by PCR NEGATIVE NEGATIVE Final    Comment: (NOTE) The Xpert Xpress SARS-CoV-2/FLU/RSV plus assay is intended as an aid in the diagnosis of influenza from Nasopharyngeal swab specimens and should not be used as a sole basis for treatment. Nasal washings and aspirates are unacceptable for Xpert Xpress SARS-CoV-2/FLU/RSV testing.  Fact Sheet for Patients: BloggerCourse.comhttps://www.fda.gov/media/152166/download  Fact Sheet for Healthcare Providers: SeriousBroker.ithttps://www.fda.gov/media/152162/download  This test is not yet approved or cleared by the [place] FDA and has been authorized for detection and/or diagnosis of SARS-CoV-2  by FDA under an Emergency Use Authorization (EUA). This EUA will remain in effect (meaning this test can be used) for the duration of the COVID-19 declaration under Section 564(b)(1) of the Act, 21 U.S.C. section 360bbb-3(b)(1), unless the authorization is terminated or revoked.  Performed at [facility], [address] [place], [place] [address]   Blood Culture (routine x 2)     Status: None (Preliminary result)   Collection Time: 01/31/20 11:47 AM   Specimen: BLOOD  Result Value Ref Range Status   Specimen Description BLOOD RIGHT ANTECUBITAL  Final   Special Requests   Final    Blood Culture adequate volume BOTTLES DRAWN AEROBIC AND ANAEROBIC   Culture   Final  NO GROWTH 4 DAYS Performed at [facility], [address] [place], [place] [address]    Report Status PENDING  Incomplete  Blood Culture (routine x 2)     Status: None (Preliminary result)   Collection Time: 01/31/20 11:47 AM   Specimen: BLOOD  Result Value Ref Range Status   Specimen Description BLOOD LEFT ANTECUBITAL  Final   Special Requests   Final    Blood Culture adequate volume BOTTLES DRAWN AEROBIC AND ANAEROBIC   Culture   Final    NO GROWTH 4 DAYS Performed at [facility], [address] [place], [place] [address]    Report Status PENDING  Incomplete  Urine culture     Status: None   Collection Time: 01/31/20 12:35 PM   Specimen: Urine, Catheterized  Result Value Ref Range Status   Specimen Description   Final    URINE, CATHETERIZED Performed at [facility], [address] [place], [place] [address]    Special Requests   Final    NONE Performed at [facility], [address], [place], [place] [address]    Culture   Final    NO GROWTH Performed at [facility], [address] [place], [place] [address]    Report Status 02/02/2020 FINAL  Final    Radiology Reports CT Head Wo Contrast  Result Date: 01/31/2020 CLINICAL DATA:  Altered mental status EXAM: CT HEAD WITHOUT CONTRAST  TECHNIQUE: Contiguous axial images were obtained from the base of the skull through the vertex without intravenous contrast. COMPARISON:  01/09/2020 FINDINGS: Brain: There is no acute intracranial hemorrhage, mass effect, or edema. Gray-white differentiation is preserved. There is no extra-axial fluid collection. Prominence of the ventricles and sulci reflects stable generalized parenchymal volume loss. Patchy and confluent areas of hypoattenuation in the supratentorial white matter nonspecific but probably reflects stable advanced chronic microvascular ischemic changes. Vascular: There is atherosclerotic calcification at the skull base. Skull: Calvarium is unremarkable. Sinuses/Orbits: Mild mucosal thickening.  Orbits are unremarkable. Other: None. IMPRESSION: No acute intracranial hemorrhage, mass effect, or evidence of acute infarction. Advanced chronic microvascular ischemic changes. Electronically Signed   By: Guadlupe Spanish M.D.   On: 01/31/2020 13:59   DG Chest Port 1 View  Result Date: 01/31/2020 CLINICAL DATA:  AMS/Spesis/htn/hx MI/ex smoker/hx heart cath Covid + 12/2019-retested 2 weeks later (per family) and was neg retesting todayQuestionable sepsis - evaluate for abnormality EXAM: PORTABLE CHEST 1 VIEW COMPARISON:  01/18/2020 FINDINGS: Enlarged cardiac silhouette. Low lung volumes. No effusion, infiltrate, or pneumothorax. Mild LEFT basilar atelectasis. IMPRESSION: 1. No interval change. 2. Low lung volumes. 3. Mild LEFT basilar atelectasis. Electronically Signed   By: Genevive Bi M.D.   On: 01/31/2020 12:33     CBC Recent Labs  Lab 01/31/20 1147 02/02/20 0448 02/03/20 0450  WBC 18.2* 8.8 9.4  HGB 10.5* 8.2* 8.5*  HCT 32.9* 26.6* 27.1*  PLT 479* 294 338  MCV 99.4 101.1* 101.5*  MCH 31.7 31.2 31.8  MCHC 31.9 30.8 31.4  RDW 13.6 13.4 13.3  LYMPHSABS 1.7  --   --   MONOABS 1.2*  --   --   EOSABS 0.2  --   --   BASOSABS 0.0  --   --     Chemistries  Recent Labs  Lab  01/31/20 1147 02/01/20 0517 02/02/20 0448 02/03/20 0450  NA 133* 136 134* 133*  K 4.0 3.7 3.5 3.3*  CL 101 105 105 103  CO2 18* 23 20* 21*  GLUCOSE  202* 114* 116* 131*  BUN 63* 46* 32* 22  CREATININE 2.52* 1.55* 1.30* 1.26*  CALCIUM 8.9 8.3* 7.9* 8.0*  AST 41 44*  --   --   ALT 58* 61*  --   --   ALKPHOS 101 95  --   --   BILITOT 1.1 0.9  --   --    ------------------------------------------------------------------------------------------------------------------ No results for input(s): CHOL, HDL, LDLCALC, TRIG, CHOLHDL, LDLDIRECT in the last 72 hours.  No results found for: HGBA1C ------------------------------------------------------------------------------------------------------------------ No results for input(s): TSH, T4TOTAL, T3FREE, THYROIDAB in the last 72 hours.  Invalid input(s): FREET3 ------------------------------------------------------------------------------------------------------------------ No results for input(s): VITAMINB12, FOLATE, FERRITIN, TIBC, IRON, RETICCTPCT in the last 72 hours.  Coagulation profile Recent Labs  Lab 01/31/20 1147  INR 1.6*    No results for input(s): DDIMER in the last 72 hours.  Cardiac Enzymes No results for input(s): CKMB, TROPONINI, MYOGLOBIN in the last 168 hours.  Invalid input(s): CK ------------------------------------------------------------------------------------------------------------------ No results found for: BNP   Shon Haleourage Nirvan Laban M.D on 02/04/2020 at 5:11 PM  Go to [email or web address] - for contact info  [facility] - Office  [phone]

## 2020-02-04 NOTE — Plan of Care (Signed)
  Problem: Acute Rehab PT Goals(only PT should resolve) Goal: Pt Will Go Supine/Side To Sit Outcome: Progressing Flowsheets (Taken 02/04/2020 1011) Pt will go Supine/Side to Sit: with moderate assist Goal: Patient Will Perform Sitting Balance Outcome: Progressing Flowsheets (Taken 02/04/2020 1011) Patient will perform sitting balance: with moderate assist Goal: Pt Will Transfer Bed To Chair/Chair To Bed Outcome: Progressing Flowsheets (Taken 02/04/2020 1011) Pt will Transfer Bed to Chair/Chair to Bed: with mod assist Goal: Pt Will Ambulate Outcome: Progressing Flowsheets (Taken 02/04/2020 1011) Pt will Ambulate:  10 feet  with moderate assist  with rolling walker   10:11 AM, 02/04/20 Ocie Bob, MPT Physical Therapist with [facility] 336 [phone] office [phone] mobile phone

## 2020-02-05 ENCOUNTER — Inpatient Hospital Stay
Admission: RE | Admit: 2020-02-05 | Discharge: 2020-02-11 | Disposition: A | Discharge status: Other Acute Inpatient Hospital | Payer: Medicare Other | Source: Ambulatory Visit | Attending: Internal Medicine | Admitting: Internal Medicine

## 2020-02-05 DIAGNOSIS — R6510 Systemic inflammatory response syndrome (SIRS) of non-infectious origin without acute organ dysfunction: Secondary | ICD-10-CM | POA: Diagnosis not present

## 2020-02-05 LAB — CBC
HCT: 27.7 % — ABNORMAL LOW (ref 39.0–52.0)
Hemoglobin: 8.9 g/dL — ABNORMAL LOW (ref 13.0–17.0)
MCH: 31.2 pg (ref 26.0–34.0)
MCHC: 32.1 g/dL (ref 30.0–36.0)
MCV: 97.2 fL (ref 80.0–100.0)
Platelets: 335 10*3/uL (ref 150–400)
RBC: 2.85 MIL/uL — ABNORMAL LOW (ref 4.22–5.81)
RDW: 13.1 % (ref 11.5–15.5)
WBC: 7.5 10*3/uL (ref 4.0–10.5)
nRBC: 0 % (ref 0.0–0.2)

## 2020-02-05 LAB — BASIC METABOLIC PANEL
Anion gap: 10 (ref 5–15)
BUN: 18 mg/dL (ref 8–23)
CO2: 23 mmol/L (ref 22–32)
Calcium: 8.4 mg/dL — ABNORMAL LOW (ref 8.9–10.3)
Chloride: 101 mmol/L (ref 98–111)
Creatinine, Ser: 1.08 mg/dL (ref 0.61–1.24)
GFR, Estimated: >60 mL/min (ref >60)
Glucose, Bld: 108 mg/dL — ABNORMAL HIGH (ref 70–99)
Potassium: 3.6 mmol/L (ref 3.5–5.1)
Sodium: 134 mmol/L — ABNORMAL LOW (ref 135–145)

## 2020-02-05 LAB — GLUCOSE, CAPILLARY
Glucose-Capillary: 97 mg/dL (ref 70–99)
Glucose-Capillary: 107 mg/dL — ABNORMAL HIGH (ref 70–99)

## 2020-02-05 LAB — CULTURE, BLOOD (ROUTINE X 2)
Culture: NO GROWTH
Culture: NO GROWTH
Special Requests: ADEQUATE
Special Requests: ADEQUATE

## 2020-02-05 LAB — RESP PANEL BY RT-PCR (FLU A&B, COVID) ARPGX2
Influenza A by PCR: NEGATIVE
Influenza B by PCR: NEGATIVE
SARS Coronavirus 2 by RT PCR: NEGATIVE

## 2020-02-05 MED ORDER — HYDROCODONE-ACETAMINOPHEN 5-325 MG PO TABS: 1.0000 | ORAL_TABLET | Freq: Two times a day (BID) | ORAL | 0 refills | Status: DC | PRN

## 2020-02-05 MED ORDER — POLYETHYLENE GLYCOL 3350 17 G PO PACK: 17.0000 g | PACK | Freq: Every day | ORAL | 1 refills | Status: AC

## 2020-02-05 MED ORDER — ACETAMINOPHEN 325 MG PO TABS: 650.0000 mg | ORAL_TABLET | Freq: Four times a day (QID) | ORAL | 1 refills | Status: AC | PRN

## 2020-02-05 MED ORDER — ONDANSETRON HCL 4 MG PO TABS: 4.0000 mg | ORAL_TABLET | Freq: Three times a day (TID) | ORAL | 0 refills | Status: AC | PRN

## 2020-02-05 MED ORDER — PANTOPRAZOLE SODIUM 40 MG PO TBEC: 40.0000 mg | DELAYED_RELEASE_TABLET | Freq: Every day | ORAL | 3 refills | Status: AC

## 2020-02-05 MED ORDER — ASPIRIN EC 81 MG PO TBEC: 81.0000 mg | DELAYED_RELEASE_TABLET | Freq: Every day | ORAL | 2 refills | Status: AC

## 2020-02-05 MED ORDER — TIZANIDINE HCL 2 MG PO TABS: 2.0000 mg | ORAL_TABLET | Freq: Two times a day (BID) | ORAL | 1 refills | Status: DC | PRN

## 2020-02-05 MED ORDER — PANTOPRAZOLE SODIUM 40 MG PO TBEC: 40.0000 mg | DELAYED_RELEASE_TABLET | Freq: Every day | ORAL | Status: DC

## 2020-02-05 MED ORDER — PRAVASTATIN SODIUM 40 MG PO TABS: 40.0000 mg | ORAL_TABLET | Freq: Every day | ORAL | 1 refills | Status: AC

## 2020-02-05 MED ORDER — LEVOTHYROXINE SODIUM 75 MCG PO TABS: 75.0000 ug | ORAL_TABLET | Freq: Every day | ORAL | 1 refills | Status: AC

## 2020-02-05 MED ORDER — FERROUS SULFATE 325 (65 FE) MG PO TBEC: 325.0000 mg | DELAYED_RELEASE_TABLET | Freq: Two times a day (BID) | ORAL | 3 refills | Status: AC

## 2020-02-05 MED ORDER — METOPROLOL TARTRATE 25 MG PO TABS
25.0000 mg | ORAL_TABLET | Freq: Two times a day (BID) | ORAL | 2 refills | Status: AC
Start: 2020-02-05 — End: ?

## 2020-02-05 MED ORDER — INSULIN ASPART 100 UNIT/ML FLEXPEN
0.0000 [IU] | PEN_INJECTOR | SUBCUTANEOUS | 11 refills | Status: DC
Start: 2020-02-05 — End: 2020-02-06

## 2020-02-05 NOTE — [place] FL2 (Signed)
[place] MEDICAID FL2 LEVEL OF CARE SCREENING TOOL     IDENTIFICATION  Patient Name: Ronnie Gregory Birthdate: 14-Aug-1933 Sex: male Admission Date (Current Location): 01/31/2020  [facility] and [place] Number:  [employer] and Address:  [facility],  [address], [place] [address]      Provider Number: [id number]  Attending Physician Name and Address:  Shon Hale, MD  Relative Name and Phone Number:       Current Level of Care: Hospital Recommended Level of Care: Skilled Nursing Facility Prior Approval Number:    Date Approved/Denied:   PASRR Number: [id number]  Discharge Plan: SNF    Current Diagnoses: Patient Active Problem List   Diagnosis Date Noted  . SIRS (systemic inflammatory response syndrome) (HCC) 01/31/2020  . Sepsis secondary to UTI (HCC) 01/31/2020  . H/o COVID-19 virus infection--+ve 01/09/20 , negative 01/25/20 (vaccinated Fall of 2021) 01/31/2020  . Acute metabolic encephalopathy  01/31/2020  . CRI (chronic renal insufficiency), stage 3 (moderate) (HCC) 05/28/2017  . Carotid artery disease (HCC) 05/28/2017  . Pseudoarthrosis of lumbar spine 03/05/2015  . Spinal stenosis, lumbar region, with neurogenic claudication 10/16/2014  . CAD (coronary artery disease) 12/02/2012  . S/P coronary artery stent placement 12/02/2012    Orientation RESPIRATION BLADDER Height & Weight     Self,Place  Normal Incontinent Weight: 196 lb 3.4 oz (89 kg) Height:  6' (182.9 cm)  BEHAVIORAL SYMPTOMS/MOOD NEUROLOGICAL BOWEL NUTRITION STATUS      Continent Diet (see dc summary)  AMBULATORY STATUS COMMUNICATION OF NEEDS Skin   Extensive Assist Verbally Normal                       Personal Care Assistance Level of Assistance  Bathing,Feeding,Dressing Bathing Assistance: Limited assistance Feeding assistance: Limited assistance Dressing Assistance: Limited assistance     Functional Limitations Info  Sight,Hearing,Speech Sight  Info: Adequate Hearing Info: Adequate Speech Info: Adequate    SPECIAL CARE FACTORS FREQUENCY  PT (By licensed PT),OT (By licensed OT)     PT Frequency: 5x week OT Frequency: 3x week            Contractures Contractures Info: Not present    Additional Factors Info  Code Status,Allergies Code Status Info: Full Allergies Info: Hydrocodone, Mobic           Current Medications (02/05/2020):  This is the current hospital active medication list Current Facility-Administered Medications  Medication Dose Route Frequency Provider Last Rate Last Admin  . 0.9 %  sodium chloride infusion  250 mL Intravenous PRN Emokpae, Courage, MD      . acetaminophen (TYLENOL) tablet 650 mg  650 mg Oral Q6H PRN Emokpae, Courage, MD       Or  . acetaminophen (TYLENOL) suppository 650 mg  650 mg Rectal Q6H PRN Emokpae, Courage, MD      . apixaban (ELIQUIS) tablet 2.5 mg  2.5 mg Oral BID Mariea Clonts, Courage, MD   2.5 mg at 02/05/20 0905  . bisacodyl (DULCOLAX) suppository 10 mg  10 mg Rectal Daily PRN Emokpae, Courage, MD      . Chlorhexidine Gluconate Cloth 2 % PADS 6 each  6 each Topical Daily Shon Hale, MD   6 each at 02/05/20 0907  . HYDROcodone-acetaminophen (NORCO/VICODIN) 5-325 MG per tablet 1 tablet  1 tablet Oral Q8H PRN Shon Hale, MD   1 tablet at 02/04/20 2251  . insulin aspart (novoLOG) injection 0-5 Units  0-5 Units Subcutaneous QHS Emokpae, Courage,  MD      . insulin aspart (novoLOG) injection 0-6 Units  0-6 Units Subcutaneous TID WC Emokpae, Courage, MD      . levothyroxine (SYNTHROID) tablet 75 mcg  75 mcg Oral QAC breakfast Shon Hale, MD   75 mcg at 02/03/20 0605  . ondansetron (ZOFRAN) tablet 4 mg  4 mg Oral Q6H PRN Emokpae, Courage, MD       Or  . ondansetron (ZOFRAN) injection 4 mg  4 mg Intravenous Q6H PRN Emokpae, Courage, MD      . pantoprazole (PROTONIX) injection 40 mg  40 mg Intravenous Q24H Emokpae, Courage, MD   40 mg at 02/04/20 1708  . polyethylene glycol  (MIRALAX / GLYCOLAX) packet 17 g  17 g Oral Daily PRN Emokpae, Courage, MD      . pravastatin (PRAVACHOL) tablet 40 mg  40 mg Oral QHS Emokpae, Courage, MD   40 mg at 02/04/20 2251  . sodium chloride flush (NS) 0.9 % injection 3 mL  3 mL Intravenous Q12H Emokpae, Courage, MD   3 mL at 02/05/20 0908  . sodium chloride flush (NS) 0.9 % injection 3 mL  3 mL Intravenous Q12H Emokpae, Courage, MD   3 mL at 02/05/20 0907  . sodium chloride flush (NS) 0.9 % injection 3 mL  3 mL Intravenous PRN Shon Hale, MD         Discharge Medications: Please see discharge summary for a list of discharge medications.  Relevant Imaging Results:  Relevant Lab Results:   Additional Information SSN: [id number], [place]

## 2020-02-05 NOTE — Progress Notes (Signed)
Pt has left with staff from [facility].

## 2020-02-05 NOTE — Progress Notes (Signed)
Pt has discharge orders. Report called to Olu at [facility] center and no further questions at this time. Iv removed and cardiac monitoring removed.

## 2020-02-05 NOTE — Progress Notes (Signed)
PT Cancellation Note  Patient Details Name: Ronnie Gregory MRN: [id number] DOB: 1933-12-02   Cancelled Treatment:    Reason Eval/Treat Not Completed: Fatigue/lethargy limiting ability to participate;Patient's level of consciousness; Patient arousable  but immediately returns to sleeping and is unable to participate with PT. Will check back later if time permits.   9:54 AM, 02/05/20 Wyman Songster PT, DPT Physical Therapist at [facility]

## 2020-02-05 NOTE — Discharge Summary (Signed)
Ronnie Gregory, is a 87 y.o. male  DOB [birth year]  MRN [id number].  Admission date:  01/31/2020  Admitting Physician  Roxan Hockey, MD  Discharge Date:  02/05/2020   Primary MD  Glenda Chroman, MD  Recommendations for primary care physician for things to follow:   1)Avoid ibuprofen/Advil/Aleve/Motrin/Goody Powders/Naproxen/BC powders/Meloxicam/Diclofenac/Indomethacin and other Nonsteroidal anti-inflammatory medications as these will make you more likely to bleed and can cause stomach ulcers, can also cause Kidney problems.   2)CBC blood test every Friday starting 02/09/20  3)insulin aspart (novoLOG) injection 0-10 Units 0-10 Units Subcutaneous, 3 times daily with meals CBG < 70: Implement Hypoglycemia Standing Orders and refer to Hypoglycemia Standing Orders sidebar report  CBG 70 - 120: 0 unit CBG 121 - 150: 0 unit  CBG 151 - 200: 1 unit CBG 201 - 250: 2 units CBG 251 - 300: 4 units CBG 301 - 350: 6 units  CBG 351 - 400: 8 units  CBG > 400: 10 units  4)--Outpatient urology visit in 1 to 2 weeks advised for voiding trial  Admission Diagnosis  Hypoxemia [R09.02] SIRS (systemic inflammatory response syndrome) (HCC) [R65.10] Hypotension, unspecified hypotension type [I95.9] Sepsis, due to unspecified organism, unspecified whether acute organ dysfunction present [facility]) [A41.9]   Discharge Diagnosis  Hypoxemia [R09.02] SIRS (systemic inflammatory response syndrome) (HCC) [R65.10] Hypotension, unspecified hypotension type [I95.9] Sepsis, due to unspecified organism, unspecified whether acute organ dysfunction present [facility]) [A41.9]    Principal Problem:   SIRS (systemic inflammatory response syndrome) (HCC) Active Problems:   Acute metabolic encephalopathy    H/o COVID-19 virus infection--+ve 01/09/20 , negative 01/25/20 (vaccinated Fall of 2021)   CAD (coronary artery disease)   S/P coronary artery  stent placement      Past Medical History:  Diagnosis Date  . Arthritis   . Chronic kidney disease   . Constipation    uses stool softener daily  . GERD (gastroesophageal reflux disease)   . Hypercholesteremia   . Hypertension   . Hypothyroidism   . Myocardial infarction [facility]) 2009   Dr. Cathren Harsh cardiologist 2 stents    Past Surgical History:  Procedure Laterality Date  . APPENDECTOMY    . BACK SURGERY     several   . CARDIAC CATHETERIZATION    . CORONARY ANGIOPLASTY     3 years ago  . LUMBAR LAMINECTOMY/ DECOMPRESSION WITH MET-RX N/A 03/05/2015   Procedure: Removal of posterior hardware Lumbar one with Metrex;  Surgeon: Kristeen Miss, MD;  Location: [facility];  Service: Neurosurgery;  Laterality: N/A;  . SKIN SURGERY     skin cancer removed from forehead       HPI  from the history and physical done on the day of admission:    Ronnie Gregory  is a 87 y.o. male with past medical history relevant for HTN, recent COVID-19 infection, CAD with prior stents in 2012, hypothyroidism, DM 2, CKD 3B who presents to the ED from SNF due to worsening altered mental status --  Most of  the history is obtained from patient's  daughter Foye Clock [phone] is POA)--as patient has altered mentation  -Patient was recently admitted to [facility] on 01/09/2020 with COVID-19 infection (he was vaccinated in the fall but he did not get his booster yet)--- during hospitalization patient had confusion and disorientation, he was treated with steroids and remdesivir, he was found to be hypoxic received bronchodilators and supplemental oxygen -He was discharged on 01/25/2020 from [facility] to SNF rehab in [place] - Pt's  daughter Foye Clock [phone] is POA)--- took patient out of the SNF in [place] today 01/31/2020 due to altered mentation that was getting worse and brought him straight to the ED today for evaluation  --The patient is found to be tachycardic,  tachypneic, hypotensive with leukocytosis, elevated lactic acid levels altered mentation and UA suspicious for possible UTI -Please note the patient had a Foley catheter placed around 01/26/2019 at the rehab facility in [place] apparently due to urinary retention -- Blood and urine culture sent and IV antibiotics initiated in the ED  -COVID-19 test negative today, please note the patient previously tested positive on 01/09/2020, please note that patient is vaccinated but not booked - In the ED patient's creatinine found to be up to 2.52 from a baseline usually between 1.2 and 1.5 -Elevated lactic acid at 2.3 noted improved to 1.7 after IV fluids -Imaging studies in the ED revealed negative chest x-ray and negative CT head from a stroke or bleed standpoint, chest x-ray      Hospital Course:    Brief Summary:- 86 y.o.malewith past medical history relevant for HTN, recent COVID-19 infection, CAD with prior stents in 2012, hypothyroidism, DM 2, CKD 3B who presents to the ED from SNF due to worsening altered mental status, admitted on 01/31/2020 with SIRS pathophysiology   A/p 1)SIRS-- ?? source--  Lactic Acid 2.3 >>>1.7 after IVF WBC 18.2>>9.4>>7.5 Covid Neg -Treated with  IV cefepime through 02/03/2020 - blood and urine cultures NGTD --Initially septic source suspected on admission, however patient did NOT rule in for sepsis SIRS pathophysiology has resolved  2) dehydration with Aki on CKD 3B--- on admission creatinine was 2.52 , - baseline 1.3 to 1.5 -Worsening renal function due to dehydration and the patient with altered mentation and poor intake, compounded by lisinopril use -, renally adjust medications, avoid nephrotoxic agents / dehydration / hypotension -Creatinine improved to 1.08 with hydration -AKI has resolved  3)Acute Metabolic Encephalopathy---most likely due to #1 and #2 above compounded by opiate and muscle relaxant to use -Overall much improved with avoidance  of excessive opiates and treatment of  dehydration/AKI  4) iron deficiency anemia ---Hgb 10.5>>.8.9----acute Vs acute on chronic -Anemia work-up requested including stool occult blood-patient was recently started on Eliquis -Anemia work-up reveals iron deficiency with a serum iron of 25 and iron saturation of 11% -Ferritin is not low -B12 and folate WNL -Suspect some of patient's drop in hemoglobin is due to hemodilution -Hemoglobin is stable above 8, start iron supplements, stop Eliquis  5)DM2--- Recent A1C is 7.3 reflecting uncontrolled DM with hyperglycemia PTA Use Novolog/Humalog Sliding scale insulin with Accu-Cheks/Fingersticks as ordered  6)CAD-- NSTEMI and stents in 2012----no chest pains, hold metoprolol due to hypotension, -Continue pravastatin, okay to restart aspirin with food now that Eliquis has been discontinued  7)Recent Covid -19 Infection--- tested positive 01/09/20, tested Negative 1 week ago and Negative again 01/31/20, COVID test negative again on 02/05/2020 -Previously completed Vaccination in early fall 2021 (not boosted) -Does not appear to  have any acute/active COVID-19 symptoms at this time  8)Social/Ethics--Full code,with full scope of treatment Discussed withdaughter Foye Clock [phone] is POA)  9)HTN-okay to restart PT antihypertensives as BP stabilized  10)Hypothyroidism --continue levothyroxine 75 mcg daily  11)urinary retention-- -Please note the patient had a Foley catheter placed around 01/26/2019 at the rehab facility in [place] apparently due to urinary retention --Outpatient urology visit advised for voiding trial Foley catheter changed on 02/02/2020  12)HypoNatrenia--improved with IV fluid, Na is 133  13) ambulatory dysfunction/generalized weakness--- PT eval appreciated recommends SNF.    Disposition/Need for in-Hospital Stay- patient unable to be discharged at this time due to---   transfer to SNF rehab   Dispo:  The patient is from:SNF Anticipated d/c is to:SNF  Code Status :  -  Code Status: Full Code   Family Communication:    (patient is alert, awake and coherent)  Discussed with with POA   Consults  :  na   Discharge Condition: Stable,  Follow UP----Outpatient urology visit advised for voiding trial   Follow-up Information    McKenzie, Candee Furbish, MD. Schedule an appointment as soon as possible for a visit in 1 week(s).   Specialty: Urology Why: for voiding trial Contact information: [address] [place] [address] [phone]             Diet and Activity recommendation:  As advised  Discharge Instructions     Discharge Instructions    Call MD for:  difficulty breathing, headache or visual disturbances   Complete by: As directed    Call MD for:  persistant dizziness or light-headedness   Complete by: As directed    Call MD for:  persistant nausea and vomiting   Complete by: As directed    Call MD for:  severe uncontrolled pain   Complete by: As directed    Call MD for:  temperature >100.4   Complete by: As directed    Diet - low sodium heart healthy   Complete by: As directed    Diet Carb Modified   Complete by: As directed    Discharge instructions   Complete by: As directed    1)Avoid ibuprofen/Advil/Aleve/Motrin/Goody Powders/Naproxen/BC powders/Meloxicam/Diclofenac/Indomethacin and other Nonsteroidal anti-inflammatory medications as these will make you more likely to bleed and can cause stomach ulcers, can also cause Kidney problems.   2)CBC blood test every Friday starting 02/09/20  3)insulin aspart (novoLOG) injection 0-10 Units 0-10 Units Subcutaneous, 3 times daily with meals CBG < 70: Implement Hypoglycemia Standing Orders and refer to Hypoglycemia Standing Orders sidebar report  CBG 70 - 120: 0 unit CBG 121 - 150: 0 unit  CBG 151 - 200: 1 unit CBG 201 - 250: 2 units CBG 251 - 300: 4 units CBG 301 - 350:  6 units  CBG 351 - 400: 8 units  CBG > 400: 10 units  4)--Outpatient urology visit in 1 to 2 weeks advised for voiding trial   Increase activity slowly   Complete by: As directed         Discharge Medications     Allergies as of 02/05/2020      Reactions   Hydrocodone    Other reaction(s): Confusion, Hallucinations   Mobic [meloxicam] Other (See Comments)   Mucus      Medication List    STOP taking these medications   Diclofenac Sodium CR 100 MG 24 hr tablet   rosuvastatin 20 MG tablet Commonly known as: CRESTOR  TAKE these medications   acetaminophen 325 MG tablet Commonly known as: TYLENOL Take 2 tablets (650 mg total) by mouth every 6 (six) hours as needed for mild pain or headache (or Fever >/= 101).   aspirin EC 81 MG tablet Take 1 tablet (81 mg total) by mouth daily with breakfast.   citalopram 20 MG tablet Commonly known as: CELEXA Take 20 mg by mouth daily.   ferrous sulfate 325 (65 FE) MG EC tablet Take 1 tablet (325 mg total) by mouth 2 (two) times daily with a meal.   HYDROcodone-acetaminophen 5-325 MG tablet Commonly known as: NORCO/VICODIN Take 1 tablet by mouth every 12 (twelve) hours as needed for moderate pain. What changed:   how much to take  when to take this  Another medication with the same name was removed. Continue taking this medication, and follow the directions you see here.   insulin aspart 100 UNIT/ML FlexPen Commonly known as: NOVOLOG Inject 0-10 Units into the skin See admin instructions. insulin aspart (novoLOG) injection 0-10 Units 0-10 Units Subcutaneous, 3 times daily with meals CBG < 70: Implement Hypoglycemia Standing Orders and refer to Hypoglycemia Standing Orders sidebar report  CBG 70 - 120: 0 unit CBG 121 - 150: 0 unit  CBG 151 - 200: 1 unit CBG 201 - 250: 2 units CBG 251 - 300: 4 units CBG 301 - 350: 6 units  CBG 351 - 400: 8 units  CBG > 400: 10 units   levothyroxine 75 MCG tablet Commonly known as:  SYNTHROID Take 1 tablet (75 mcg total) by mouth daily before breakfast. What changed: Another medication with the same name was removed. Continue taking this medication, and follow the directions you see here.   metoprolol tartrate 25 MG tablet Commonly known as: LOPRESSOR Take 1 tablet (25 mg total) by mouth 2 (two) times daily.   ondansetron 4 MG tablet Commonly known as: ZOFRAN Take 1 tablet (4 mg total) by mouth every 8 (eight) hours as needed for nausea.   pantoprazole 40 MG tablet Commonly known as: PROTONIX Take 1 tablet (40 mg total) by mouth daily. Start taking on: February 06, 2020   polyethylene glycol 17 g packet Commonly known as: MIRALAX / GLYCOLAX Take 17 g by mouth daily.   pravastatin 40 MG tablet Commonly known as: PRAVACHOL Take 1 tablet (40 mg total) by mouth at bedtime.   tiZANidine 2 MG tablet Commonly known as: ZANAFLEX Take 1 tablet (2 mg total) by mouth every 12 (twelve) hours as needed for muscle spasms (back pain). What changed:   medication strength  how much to take  when to take this  reasons to take this       Major procedures and Radiology Reports - PLEASE review detailed and final reports for all details, in brief -   CT Head Wo Contrast  Result Date: 01/31/2020 CLINICAL DATA:  Altered mental status EXAM: CT HEAD WITHOUT CONTRAST TECHNIQUE: Contiguous axial images were obtained from the base of the skull through the vertex without intravenous contrast. COMPARISON:  01/09/2020 FINDINGS: Brain: There is no acute intracranial hemorrhage, mass effect, or edema. Gray-white differentiation is preserved. There is no extra-axial fluid collection. Prominence of the ventricles and sulci reflects stable generalized parenchymal volume loss. Patchy and confluent areas of hypoattenuation in the supratentorial white matter nonspecific but probably reflects stable advanced chronic microvascular ischemic changes. Vascular: There is atherosclerotic  calcification at the skull base. Skull: Calvarium is unremarkable. Sinuses/Orbits: Mild mucosal thickening.  Orbits are  unremarkable. Other: None. IMPRESSION: No acute intracranial hemorrhage, mass effect, or evidence of acute infarction. Advanced chronic microvascular ischemic changes. Electronically Signed   By: Macy Mis M.D.   On: 01/31/2020 13:59   DG Chest Port 1 View  Result Date: 01/31/2020 CLINICAL DATA:  AMS/Spesis/htn/hx MI/ex smoker/hx heart cath Covid + 12/2019-retested 2 weeks later (per family) and was neg retesting todayQuestionable sepsis - evaluate for abnormality EXAM: PORTABLE CHEST 1 VIEW COMPARISON:  01/18/2020 FINDINGS: Enlarged cardiac silhouette. Low lung volumes. No effusion, infiltrate, or pneumothorax. Mild LEFT basilar atelectasis. IMPRESSION: 1. No interval change. 2. Low lung volumes. 3. Mild LEFT basilar atelectasis. Electronically Signed   By: Suzy Bouchard M.D.   On: 01/31/2020 12:33    Micro Results   Recent Results (from the past 240 hour(s))  Resp Panel by RT-PCR (Flu A&B, Covid) Nasopharyngeal Swab     Status: None   Collection Time: 01/31/20 11:45 AM   Specimen: Nasopharyngeal Swab; Nasopharyngeal(NP) swabs in vial transport medium  Result Value Ref Range Status   SARS Coronavirus 2 by RT PCR NEGATIVE NEGATIVE Final    Comment: (NOTE) SARS-CoV-2 target nucleic acids are NOT DETECTED.  The SARS-CoV-2 RNA is generally detectable in upper respiratory specimens during the acute phase of infection. The lowest concentration of SARS-CoV-2 viral copies this assay can detect is 138 copies/mL. A negative result does not preclude SARS-Cov-2 infection and should not be used as the sole basis for treatment or other patient management decisions. A negative result may occur with  improper specimen collection/handling, submission of specimen other than nasopharyngeal swab, presence of viral mutation(s) within the areas targeted by this assay, and inadequate  number of viral copies(<138 copies/mL). A negative result must be combined with clinical observations, patient history, and epidemiological information. The expected result is Negative.  Fact Sheet for Patients:  EntrepreneurPulse.com.au  Fact Sheet for Healthcare Providers:  IncredibleEmployment.be  This test is no t yet approved or cleared by the [place] FDA and  has been authorized for detection and/or diagnosis of SARS-CoV-2 by FDA under an Emergency Use Authorization (EUA). This EUA will remain  in effect (meaning this test can be used) for the duration of the COVID-19 declaration under Section 564(b)(1) of the Act, 21 U.S.C.section 360bbb-3(b)(1), unless the authorization is terminated  or revoked sooner.       Influenza A by PCR NEGATIVE NEGATIVE Final   Influenza B by PCR NEGATIVE NEGATIVE Final    Comment: (NOTE) The Xpert Xpress SARS-CoV-2/FLU/RSV plus assay is intended as an aid in the diagnosis of influenza from Nasopharyngeal swab specimens and should not be used as a sole basis for treatment. Nasal washings and aspirates are unacceptable for Xpert Xpress SARS-CoV-2/FLU/RSV testing.  Fact Sheet for Patients: EntrepreneurPulse.com.au  Fact Sheet for Healthcare Providers: IncredibleEmployment.be  This test is not yet approved or cleared by the [place] FDA and has been authorized for detection and/or diagnosis of SARS-CoV-2 by FDA under an Emergency Use Authorization (EUA). This EUA will remain in effect (meaning this test can be used) for the duration of the COVID-19 declaration under Section 564(b)(1) of the Act, 21 U.S.C. section 360bbb-3(b)(1), unless the authorization is terminated or revoked.  Performed at [facility], [address], [place], [place] [address]   Blood Culture (routine x 2)     Status: None   Collection Time: 01/31/20 11:47 AM   Specimen: BLOOD  Result  Value Ref Range Status   Specimen Description BLOOD RIGHT ANTECUBITAL  Final  Special Requests   Final    Blood Culture adequate volume BOTTLES DRAWN AEROBIC AND ANAEROBIC   Culture   Final    NO GROWTH 5 DAYS Performed at [facility], [address] [place], [place] [address]    Report Status 02/05/2020 FINAL  Final  Blood Culture (routine x 2)     Status: None   Collection Time: 01/31/20 11:47 AM   Specimen: BLOOD  Result Value Ref Range Status   Specimen Description BLOOD LEFT ANTECUBITAL  Final   Special Requests   Final    Blood Culture adequate volume BOTTLES DRAWN AEROBIC AND ANAEROBIC   Culture   Final    NO GROWTH 5 DAYS Performed at [facility], [address], [place], [place] [address]    Report Status 02/05/2020 FINAL  Final  Urine culture     Status: None   Collection Time: 01/31/20 12:35 PM   Specimen: Urine, Catheterized  Result Value Ref Range Status   Specimen Description   Final    URINE, CATHETERIZED Performed at [facility], [address] [place], [place] [address]    Special Requests   Final    NONE Performed at [facility], [address] [place], [place] [address]    Culture   Final    NO GROWTH Performed at [facility], [place] [address] [place], [place] [address]    Report Status 02/02/2020 FINAL  Final  Resp Panel by RT-PCR (Flu A&B, Covid) Nasopharyngeal Swab     Status: None   Collection Time: 02/05/20 11:42 AM   Specimen: Nasopharyngeal Swab; Nasopharyngeal(NP) swabs in vial transport medium  Result Value Ref Range Status   SARS Coronavirus 2 by RT PCR NEGATIVE NEGATIVE Final    Comment: (NOTE) SARS-CoV-2 target nucleic acids are NOT DETECTED.  The SARS-CoV-2 RNA is generally detectable in upper respiratory specimens during the acute phase of infection. The lowest concentration of SARS-CoV-2 viral copies this assay can detect is 138 copies/mL. A negative result does not preclude SARS-Cov-2 infection and should not be used as  the sole basis for treatment or other patient management decisions. A negative result may occur with  improper specimen collection/handling, submission of specimen other than nasopharyngeal swab, presence of viral mutation(s) within the areas targeted by this assay, and inadequate number of viral copies(<138 copies/mL). A negative result must be combined with clinical observations, patient history, and epidemiological information. The expected result is Negative.  Fact Sheet for Patients:  EntrepreneurPulse.com.au  Fact Sheet for Healthcare Providers:  IncredibleEmployment.be  This test is no t yet approved or cleared by the [place] FDA and  has been authorized for detection and/or diagnosis of SARS-CoV-2 by FDA under an Emergency Use Authorization (EUA). This EUA will remain  in effect (meaning this test can be used) for the duration of the COVID-19 declaration under Section 564(b)(1) of the Act, 21 U.S.C.section 360bbb-3(b)(1), unless the authorization is terminated  or revoked sooner.       Influenza A by PCR NEGATIVE NEGATIVE Final   Influenza B by PCR NEGATIVE NEGATIVE Final    Comment: (NOTE) The Xpert Xpress SARS-CoV-2/FLU/RSV plus assay is intended as an aid in the diagnosis of influenza from Nasopharyngeal swab specimens and should not be used as a sole basis for treatment. Nasal washings and aspirates are unacceptable for Xpert Xpress SARS-CoV-2/FLU/RSV testing.  Fact Sheet for Patients: EntrepreneurPulse.com.au  Fact Sheet for Healthcare Providers: IncredibleEmployment.be  This test is not yet approved or cleared by the  [place] Architectural technologist and has been authorized for detection and/or diagnosis of SARS-CoV-2 by FDA under an Print production planner (EUA). This EUA will remain in effect (meaning this test can be used) for the duration of the COVID-19 declaration under Section 564(b)(1) of  the Act, 21 U.S.C. section 360bbb-3(b)(1), unless the authorization is terminated or revoked.  Performed at [facility], [address], [place], [place] [address]      Today   Subjective    Ronnie Gregory today has no new complaints -Oral intake is not great No fevers, no vomiting          Patient has been seen and examined prior to discharge   Objective   Blood pressure 140/67, pulse 94, temperature 98 F (36.7 C), resp. rate 17, height 6' (1.829 m), weight 89 kg, SpO2 94 %.   Intake/Output Summary (Last 24 hours) at 02/05/2020 1448 Last data filed at 02/05/2020 0908 Gross per 24 hour  Intake 486 ml  Output 1400 ml  Net -914 ml    Exam  Gen:- Awake Alert, more talkative HEENT:- [place].AT, No sclera icterus Ears-very HOH  Neck-Supple Neck,No JVD,.  Lungs-  CTAB , fair symmetrical air movement CV- S1, S2 normal, irregular  Abd-  +ve B.Sounds, Abd Soft, No tenderness, no CVA area tenderness Extremity/Skin:- No  edema, pedal pulses present  Psych-affect is appropriate, oriented x3 Neuro-generalized weakness, no new focal deficits, no tremors GU-Foley catheter in situ (changed 02/02/2020)   Data Review   CBC w Diff:  Lab Results  Component Value Date   WBC 7.5 02/05/2020   HGB 8.9 (L) 02/05/2020   HCT 27.7 (L) 02/05/2020   PLT 335 02/05/2020   LYMPHOPCT 9 01/31/2020   MONOPCT 7 01/31/2020   EOSPCT 1 01/31/2020   BASOPCT 0 01/31/2020    CMP:  Lab Results  Component Value Date   NA 134 (L) 02/05/2020   K 3.6 02/05/2020   CL 101 02/05/2020   CO2 23 02/05/2020   BUN 18 02/05/2020   CREATININE 1.08 02/05/2020   PROT 5.4 (L) 02/01/2020   ALBUMIN 2.1 (L) 02/02/2020   BILITOT 0.9 02/01/2020   ALKPHOS 95 02/01/2020   AST 44 (H) 02/01/2020   ALT 61 (H) 02/01/2020  .   Total Discharge time is about 33 minutes  Roxan Hockey M.D on 02/05/2020 at 2:48 PM  Go to [email or web address] -  for contact info  [facility] - Office  [phone]

## 2020-02-05 NOTE — TOC Progression Note (Signed)
Transition of Care [facility]) - Progression Note    Patient Details  Name: Ronnie Gregory MRN: [id number] Date of Birth: 16-Mar-1933  Transition of Care [facility]) CM/SW Contact  Elliot Gault, LCSW Phone Number: 02/05/2020, 9:33 AM  Clinical Narrative:     TOC following. Spoke with pt's daughter, Arline Asp, by phone this AM to discuss dc planning. Reviewed options of HH vs SNF. Cindy agreeable to SNF referrals at this time. Discussed CMS provider options and will refer as requested. Will notify pt's insurance.  TOC will follow.  Expected Discharge Plan: Skilled Nursing Facility Barriers to Discharge: Continued Medical Work up  Expected Discharge Plan and Services Expected Discharge Plan: Skilled Nursing Facility In-house Referral: Clinical Social Work                                             Social Determinants of Health (SDOH) Interventions    Readmission Risk Interventions Readmission Risk Prevention Plan 02/02/2020  Medication Screening Complete  Transportation Screening Complete  Some recent data might be hidden

## 2020-02-05 NOTE — Discharge Instructions (Signed)
1)Avoid ibuprofen/Advil/Aleve/Motrin/Goody Powders/Naproxen/BC powders/Meloxicam/Diclofenac/Indomethacin and other Nonsteroidal anti-inflammatory medications as these will make you more likely to bleed and can cause stomach ulcers, can also cause Kidney problems.   2)CBC blood test every Friday starting 02/09/20  3)insulin aspart (novoLOG) injection 0-10 Units 0-10 Units Subcutaneous, 3 times daily with meals CBG < 70: Implement Hypoglycemia Standing Orders and refer to Hypoglycemia Standing Orders sidebar report  CBG 70 - 120: 0 unit CBG 121 - 150: 0 unit  CBG 151 - 200: 1 unit CBG 201 - 250: 2 units CBG 251 - 300: 4 units CBG 301 - 350: 6 units  CBG 351 - 400: 8 units  CBG > 400: 10 units  4)--Outpatient urology visit in 1 to 2 weeks advised for voiding trial

## 2020-02-05 NOTE — TOC Transition Note (Signed)
Transition of Care [facility]) - CM/SW Discharge Note   Patient Details  Name: Ronnie Gregory MRN: [id number] Date of Birth: February 07, 1933  Transition of Care [facility]) CM/SW Contact:  Elliot Gault, LCSW Phone Number: 02/05/2020, 2:54 PM   Clinical Narrative:     Everything complete for pt to dc to [facility] SNF today. Pt's daughter aware and in agreement. RN to call report. DC clinical sent electronically.   There are no other TOC needs for dc.  Final next level of care: Skilled Nursing Facility Barriers to Discharge: Barriers Resolved   Patient Goals and CMS Choice   CMS Medicare.gov Compare Post Acute Care list provided to:: Patient Represenative (must comment) Choice offered to / list presented to : Adult Children  Discharge Placement PASRR number recieved: 02/05/20            Patient chooses bed at: [facility] Patient to be transferred to facility by: w/c Name of family member notified: Arline Asp Patient and family notified of of transfer: 02/05/20  Discharge Plan and Services In-house Referral: Clinical Social Work                                   Social Determinants of Health (SDOH) Interventions     Readmission Risk Interventions Readmission Risk Prevention Plan 02/02/2020  Medication Screening Complete  Transportation Screening Complete  Some recent data might be hidden

## 2020-02-05 NOTE — Care Management Important Message (Signed)
Important Message  Patient Details  Name: Ronnie Gregory MRN: [id number] Date of Birth: 1933/02/28   Medicare Important Message Given:  Yes (spoke with daughter Arline Asp by telephone, Mosie Lukes number provided.  RN was asked to place copy with patient paperwork)     Corey Harold 02/05/2020, 1:04 PM

## 2020-02-06 ENCOUNTER — Encounter: Payer: Self-pay | Admitting: Adult Health

## 2020-02-06 ENCOUNTER — Non-Acute Institutional Stay (SKILLED_NURSING_FACILITY): Payer: Medicare Other | Admitting: Adult Health

## 2020-02-06 DIAGNOSIS — I2510 Atherosclerotic heart disease of native coronary artery without angina pectoris: Secondary | ICD-10-CM | POA: Diagnosis not present

## 2020-02-06 DIAGNOSIS — F0393 Unspecified dementia, unspecified severity, with mood disturbance: Secondary | ICD-10-CM

## 2020-02-06 DIAGNOSIS — E785 Hyperlipidemia, unspecified: Secondary | ICD-10-CM

## 2020-02-06 DIAGNOSIS — M48062 Spinal stenosis, lumbar region with neurogenic claudication: Secondary | ICD-10-CM | POA: Diagnosis not present

## 2020-02-06 DIAGNOSIS — K5909 Other constipation: Secondary | ICD-10-CM

## 2020-02-06 DIAGNOSIS — N183 Chronic kidney disease, stage 3 unspecified: Secondary | ICD-10-CM

## 2020-02-06 DIAGNOSIS — E1159 Type 2 diabetes mellitus with other circulatory complications: Secondary | ICD-10-CM | POA: Diagnosis not present

## 2020-02-06 DIAGNOSIS — E039 Hypothyroidism, unspecified: Secondary | ICD-10-CM

## 2020-02-06 DIAGNOSIS — R6510 Systemic inflammatory response syndrome (SIRS) of non-infectious origin without acute organ dysfunction: Secondary | ICD-10-CM

## 2020-02-06 DIAGNOSIS — I152 Hypertension secondary to endocrine disorders: Secondary | ICD-10-CM

## 2020-02-06 DIAGNOSIS — Z794 Long term (current) use of insulin: Secondary | ICD-10-CM

## 2020-02-06 DIAGNOSIS — R339 Retention of urine, unspecified: Secondary | ICD-10-CM

## 2020-02-06 DIAGNOSIS — E1122 Type 2 diabetes mellitus with diabetic chronic kidney disease: Secondary | ICD-10-CM

## 2020-02-06 DIAGNOSIS — U071 COVID-19: Secondary | ICD-10-CM

## 2020-02-06 DIAGNOSIS — G834 Cauda equina syndrome: Secondary | ICD-10-CM

## 2020-02-06 DIAGNOSIS — F0280 Dementia in other diseases classified elsewhere without behavioral disturbance: Secondary | ICD-10-CM

## 2020-02-06 DIAGNOSIS — E1169 Type 2 diabetes mellitus with other specified complication: Secondary | ICD-10-CM

## 2020-02-06 DIAGNOSIS — D509 Iron deficiency anemia, unspecified: Secondary | ICD-10-CM

## 2020-02-06 DIAGNOSIS — K219 Gastro-esophageal reflux disease without esophagitis: Secondary | ICD-10-CM

## 2020-02-06 DIAGNOSIS — F32A Depression, unspecified: Secondary | ICD-10-CM

## 2020-02-06 DIAGNOSIS — F0150 Vascular dementia without behavioral disturbance: Secondary | ICD-10-CM

## 2020-02-06 LAB — GLUCOSE, CAPILLARY
Glucose-Capillary: 104 mg/dL — ABNORMAL HIGH (ref 70–99)
Glucose-Capillary: 105 mg/dL — ABNORMAL HIGH (ref 70–99)
Glucose-Capillary: 101 mg/dL — ABNORMAL HIGH (ref 70–99)
Glucose-Capillary: 94 mg/dL (ref 70–99)
Glucose-Capillary: 101 mg/dL — ABNORMAL HIGH (ref 70–99)

## 2020-02-06 NOTE — Progress Notes (Signed)
Location:  [facility] Room Number: 127-D Place of Service:  SNF (31)   CODE STATUS: Full Code   Allergies  Allergen Reactions  . Hydrocodone     Other reaction(s): Confusion, Hallucinations  . Mobic [Meloxicam] Other (See Comments)    Mucus    Chief Complaint  Patient presents with  . Hospitalization Follow-up    Follow up from hospital stay 01/31/2020-02/05/2020    HPI:  He is a 87 year old man who has been hospitalized from 01-31-20; through 02-05-20. He was covid positive on 01-09-20 then negative on 01-25-20. He had been living in another SNF. His daughter took him to the ED. He was admitted to the hospital for altered mental status prior to his previous admission to a SNF. He was treated for SIRS from questionable source. Urine and blood cultures are negative. He was treated with IV cefepime through 02-03-20. He was treated for acute on chronic stage 3b kidney failure. His altered mental status; poor intake. He has a chronic foley placed in Jan 2021 due to  Urine retention. He has iron deficiency anemia with a serum iron of 25. b12 and folate normal. His hgb has dropped to 8.9 felt to be dilution. He was started on iron and his eliquis (taken post covid)  was stopped.   More than likely this does represent a long term placement for him. There are indications of pain; is alert to himself; there are no reports of agitation or anxiety; no reports of constipation. He will continue to be followed for his chronic illnesses including: Hypertension associated with diabetes:    Coronary artery disease involving native artery of native heart without angina:    Controlled type 2 diabetes mellitus with stage 3 chronic kidney disease with long term current use of insulin:   Past Medical History:  Diagnosis Date  . Arthritis   . Chronic kidney disease   . Constipation    uses stool softener daily  . GERD (gastroesophageal reflux disease)   . Hypercholesteremia   .  Hypertension   . Hypothyroidism   . Myocardial infarction [facility]) 2009   Dr. Cathren Harsh cardiologist 2 stents    Past Surgical History:  Procedure Laterality Date  . APPENDECTOMY    . BACK SURGERY     several   . CARDIAC CATHETERIZATION    . CORONARY ANGIOPLASTY     3 years ago  . LUMBAR LAMINECTOMY/ DECOMPRESSION WITH MET-RX N/A 03/05/2015   Procedure: Removal of posterior hardware Lumbar one with Metrex;  Surgeon: Kristeen Miss, MD;  Location: [facility];  Service: Neurosurgery;  Laterality: N/A;  . SKIN SURGERY     skin cancer removed from forehead    Social History   Socioeconomic History  . Marital status: Widowed    Spouse name: Not on file  . Number of children: Not on file  . Years of education: Not on file  . Highest education level: Not on file  Occupational History  . Not on file  Tobacco Use  . Smoking status: Former Smoker    Start date: 01/20/1955    Quit date: 01/20/1960    Years since quitting: 60.0  . Smokeless tobacco: Never Used  Vaping Use  . Vaping Use: Never used  Substance and Sexual Activity  . Alcohol use: No    Alcohol/week: 0.0 standard drinks  . Drug use: No  . Sexual activity: Not on file  Other Topics Concern  . Not on file  Social History Narrative  . Not on file   Social Determinants of Health   Financial Resource Strain: Not on file  Food Insecurity: Not on file  Transportation Needs: Not on file  Physical Activity: Not on file  Stress: Not on file  Social Connections: Not on file  Intimate Partner Violence: Not on file   Family History  Problem Relation Age of Onset  . Alzheimer's disease Mother       VITAL SIGNS BP 138/84   Pulse 74   Temp 98.2 F (36.8 C)   Resp 20   Ht 6' (1.829 m)   Wt 196 lb (88.9 kg)   SpO2 95%   BMI 26.58 kg/m   Outpatient Encounter Medications as of 02/06/2020  Medication Sig  . acetaminophen (TYLENOL) 325 MG tablet Take 2 tablets (650 mg total) by mouth every 6 (six) hours as  needed for mild pain or headache (or Fever >/= 101).  Marland Kitchen aspirin EC 81 MG tablet Take 1 tablet (81 mg total) by mouth daily with breakfast.  . citalopram (CELEXA) 20 MG tablet Take 20 mg by mouth daily.  . ferrous sulfate 325 (65 FE) MG EC tablet Take 1 tablet (325 mg total) by mouth 2 (two) times daily with a meal.  . insulin aspart (NOVOLOG FLEXPEN) 100 UNIT/ML FlexPen Inject 5 Units into the skin 3 (three) times daily with meals. Special instructions: If accu-check is >200 . Hold for accu-check 200 or less  . levothyroxine (SYNTHROID) 75 MCG tablet Take 1 tablet (75 mcg total) by mouth daily before breakfast.  . metoprolol tartrate (LOPRESSOR) 25 MG tablet Take 1 tablet (25 mg total) by mouth 2 (two) times daily.  . ondansetron (ZOFRAN) 4 MG tablet Take 1 tablet (4 mg total) by mouth every 8 (eight) hours as needed for nausea.  . pantoprazole (PROTONIX) 40 MG tablet Take 1 tablet (40 mg total) by mouth daily.  . polyethylene glycol (MIRALAX / GLYCOLAX) 17 g packet Take 17 g by mouth daily.  . pravastatin (PRAVACHOL) 40 MG tablet Take 1 tablet (40 mg total) by mouth at bedtime.  Marland Kitchen tiZANidine (ZANAFLEX) 2 MG tablet Take 1 tablet (2 mg total) by mouth every 12 (twelve) hours as needed for muscle spasms (back pain).  Marland Kitchen UNABLE TO FIND Diet: NAS & Consistent Carbohydrate   No facility-administered encounter medications on file as of 02/06/2020.     SIGNIFICANT DIAGNOSTIC EXAMS  TODAY;   01-31-20: ct head: No acute intracranial hemorrhage, mass effect, or evidence of acute infarction. Advanced chronic microvascular ischemic changes.  01-31-20: chest x-ray: 1. No interval change. 2. Low lung volumes. 3. Mild LEFT basilar atelectasis  LABS REVIEWED:   01-11-20: hgb a1c 7.3 01-16-20: tsh 3.532 01-31-20: wbc 18.2; hgb 10.5; hct 32.9; mcv 99.4 plt 479; glucose 202; bun 63; creat 2.52; k+ 4.0; na++ 133; ca 8.9; ALT 58; albumin 2.9 GFR 24; blood and urine culture: no growth; iron 25; tibc 221; ferritin  859; vit B 12: 218 folate 11.5  02-02-20: wbc 8.8 hgb 8.2 ;hct 26.6; mcv 101.1 plt 294; glucose 116; bun 32; creat 1.30; na++ 134; ca 7.9; phos 2.7 albumin 2.1  02-05-20: glucose 108; bun 18; creat 1.08; k+ 3.6; na++ 134; ca 8.4 GFR>60     Review of Systems  Unable to perform ROS: Dementia (unable to participate )    Physical Exam Constitutional:      General: He is not in acute distress.    Appearance: He is well-developed and  well-nourished. He is not diaphoretic.  Neck:     Thyroid: No thyromegaly.     Vascular: Carotid bruit present.  Cardiovascular:     Rate and Rhythm: Normal rate and regular rhythm.     Pulses: Normal pulses and intact distal pulses.     Heart sounds: Normal heart sounds.  Pulmonary:     Effort: Pulmonary effort is normal. No respiratory distress.     Breath sounds: Normal breath sounds.  Abdominal:     General: Bowel sounds are normal. There is no distension.     Palpations: Abdomen is soft.     Tenderness: There is no abdominal tenderness.  Genitourinary:    Comments: Foley  Musculoskeletal:        General: No edema.     Cervical back: Neck supple.     Right lower leg: No edema.     Left lower leg: No edema.     Comments: Is able to move all extremities   Lymphadenopathy:     Cervical: No cervical adenopathy.  Skin:    General: Skin is warm and dry.  Neurological:     Mental Status: He is alert. Mental status is at baseline.  Psychiatric:        Mood and Affect: Mood and affect and mood normal.        ASSESSMENT/ PLAN:  TODAY  1. SIRS (systemic inflammation response syndrome)/history of covid 01-09-20 now negative: has completed cefepime vital signs are stable; will continue to monitor   2. Hypertension associated with diabetes: is stable b/p 138/84; will continue lopressor 25 mg twice daily   3. Coronary artery disease involving native artery of native heart without angina: is status post stent placement: will continue asa 81 mg daily  lopressor 25 mg twice daily   4. Controlled type 2 diabetes mellitus with stage 3 chronic kidney disease with long term current use of insulin: is stable hgb a1c 7.3; will continue novolog 5 units with meals for cbg>200  5. Stage 3 chronic kidney disease due to type 2 diabetes mellitus: is stable bun 18; creat 1.08 will monitor  6. Spinal stenosis lumbar region with neurogenic claudication/ cauda equina syndrome: is stable no indications of pain present; will continue zanaflex 2 mg twice daily as needed for spasms  7. Hyperlipidemia due to type 2 diabetes mellitus: is stable will continue pravachol 40 mg daily   8. Vascular dementia without behavioral disturbance: weight is 196 pounds will continue to monitor his status  9. Depression due to dementia: is stable will continue celexa 20 mg daily   10. Hypothyroidism in adult: is stable tsh 3.523: will continue synthroid 75 mcg daily   11. GERD without esophagitis: is stable will continue protonix 40 mg daily   12. Chronic constipation: is stable will continue miralax daily   13. Chronic iron anemia: is stable hgb 8.2 will continue iron twice daily   14. Chronic urine retention: is stable has long term foley.        MD is aware of resident's narcotic use and is in agreement with current plan of care. We will attempt to wean resident as appropriate.  Ok Edwards NP [facility]  Contact [phone] Monday through Friday 8am- 5pm  After hours call [phone]

## 2020-02-07 DIAGNOSIS — R339 Retention of urine, unspecified: Secondary | ICD-10-CM | POA: Insufficient documentation

## 2020-02-07 DIAGNOSIS — F0393 Unspecified dementia, unspecified severity, with mood disturbance: Secondary | ICD-10-CM | POA: Insufficient documentation

## 2020-02-07 DIAGNOSIS — F0150 Vascular dementia without behavioral disturbance: Secondary | ICD-10-CM | POA: Insufficient documentation

## 2020-02-07 DIAGNOSIS — K219 Gastro-esophageal reflux disease without esophagitis: Secondary | ICD-10-CM | POA: Insufficient documentation

## 2020-02-07 DIAGNOSIS — F0280 Dementia in other diseases classified elsewhere without behavioral disturbance: Secondary | ICD-10-CM | POA: Insufficient documentation

## 2020-02-07 DIAGNOSIS — E1122 Type 2 diabetes mellitus with diabetic chronic kidney disease: Secondary | ICD-10-CM | POA: Insufficient documentation

## 2020-02-07 DIAGNOSIS — N183 Chronic kidney disease, stage 3 unspecified: Secondary | ICD-10-CM | POA: Insufficient documentation

## 2020-02-07 DIAGNOSIS — G834 Cauda equina syndrome: Secondary | ICD-10-CM | POA: Insufficient documentation

## 2020-02-07 DIAGNOSIS — K5909 Other constipation: Secondary | ICD-10-CM | POA: Insufficient documentation

## 2020-02-07 DIAGNOSIS — I152 Hypertension secondary to endocrine disorders: Secondary | ICD-10-CM | POA: Insufficient documentation

## 2020-02-07 DIAGNOSIS — D509 Iron deficiency anemia, unspecified: Secondary | ICD-10-CM | POA: Insufficient documentation

## 2020-02-07 DIAGNOSIS — E1159 Type 2 diabetes mellitus with other circulatory complications: Secondary | ICD-10-CM | POA: Insufficient documentation

## 2020-02-07 DIAGNOSIS — E785 Hyperlipidemia, unspecified: Secondary | ICD-10-CM | POA: Insufficient documentation

## 2020-02-07 DIAGNOSIS — E1169 Type 2 diabetes mellitus with other specified complication: Secondary | ICD-10-CM | POA: Insufficient documentation

## 2020-02-07 DIAGNOSIS — E039 Hypothyroidism, unspecified: Secondary | ICD-10-CM | POA: Insufficient documentation

## 2020-02-08 ENCOUNTER — Encounter: Payer: Self-pay | Admitting: Internal Medicine

## 2020-02-08 ENCOUNTER — Non-Acute Institutional Stay (SKILLED_NURSING_FACILITY): Payer: Medicare Other | Admitting: Internal Medicine

## 2020-02-08 DIAGNOSIS — R6510 Systemic inflammatory response syndrome (SIRS) of non-infectious origin without acute organ dysfunction: Secondary | ICD-10-CM | POA: Diagnosis not present

## 2020-02-08 DIAGNOSIS — Z794 Long term (current) use of insulin: Secondary | ICD-10-CM

## 2020-02-08 DIAGNOSIS — U071 COVID-19: Secondary | ICD-10-CM

## 2020-02-08 DIAGNOSIS — N183 Chronic kidney disease, stage 3 unspecified: Secondary | ICD-10-CM

## 2020-02-08 DIAGNOSIS — D509 Iron deficiency anemia, unspecified: Secondary | ICD-10-CM | POA: Diagnosis not present

## 2020-02-08 DIAGNOSIS — N179 Acute kidney failure, unspecified: Secondary | ICD-10-CM

## 2020-02-08 DIAGNOSIS — E1122 Type 2 diabetes mellitus with diabetic chronic kidney disease: Secondary | ICD-10-CM

## 2020-02-08 DIAGNOSIS — G9341 Metabolic encephalopathy: Secondary | ICD-10-CM | POA: Diagnosis not present

## 2020-02-08 NOTE — Assessment & Plan Note (Signed)
02/08/2020 patient is nonverbal and will open eyes but not focus.  He follows no commands.  Epic records were reviewed.  He did have mild elevation of ALT at 61 and AST at 44 on 1/13.  Complete metabolic profile, CBC and differential, and ammonia level be checked. D/C Zanaflex

## 2020-02-08 NOTE — Progress Notes (Signed)
NURSING HOME LOCATION:  [facility]  ROOM NUMBER:  127-D  CODE STATUS:  Full Code  PCP:  Ignatius Specking, MD  This is a comprehensive admission note to [facility] performed on this date less than 30 days from date of admission. Included are preadmission medical/surgical history; reconciled medication list; family history; social history and comprehensive review of systems.  Corrections and additions to the records were documented. Comprehensive physical exam was also performed. Additionally a clinical summary was entered for each active diagnosis pertinent to this admission in the Problem List to enhance continuity of care.  HPI: He was hospitalized 1/12 - 02/05/2020 admitted from a SNF due to altered mental status.  Clinically etiology was SIRS associated with hypoxemia, hypotension, tachycardia, leukocytosis, and elevated lactic acid levels at 2.3 in the context of sepsis due to unspecified organism.  With rehydration lactic acid level dropped to 1.7.  White count was initially 18,200 but resolved with a final value of 7500.  He did receive IV cefepime through 1/15.  Blood and urine cultures revealed no growth. CT of the head was negative for acute processes.  Chest x-ray revealed no evidence of active infiltrate. AKI superimposed on CKD was present.  Creatinine was 2.52 with a baseline felt to be 1.2-1.5.  With rehydration creatinine improved to 1.08. The acute metabolic encephalopathy secondary to the above processes was felt to have been impacted by opioids and muscle relaxant medications. He did exhibit iron deficiency anemia with a drop in hemoglobin from 10.5 to 8.9.Serum iron was 25 and iron saturation 11%.  Ferritin was not decreased.  B12 and folate levels were normal.  Some of the drop in hemoglobin was felt to be due to hemodilution. Anemia was in the context of recent initiation of Eliquis.  Eliquis was discontinued and iron supplements initiated. Current A1c was  7.3 indicating adequate control in the context of his advanced age and multiple comorbidities.  Sliding scale insulin was initiated. Because of hypotension metoprolol was held but subsequently reinitiated. Hyponatremia was present but improved with IV fluids.  Final value was 133. Course was complicated by urinary retention requiring a Foley catheterization as of 1/7.  Urology visit advised for voiding trial post discharge.  Foley catheter was changed 1/14. Significant history reveals recent COVID-19 viral infection with positive screening 01/09/2020.  He had been vaccinated x2 in the fall 2021 but had not received his booster.  He had been admitted to [facility] on 12/21; that hospitalization was associated with confusion and disorientation.  He received steroids and remdesivir.  Aggressive pulmonary toilet and supplemental oxygen were prescribed for hypoxia.  At that time he was discharged on 1/6 to an SNF rehab in [place].   Repeat PCR testing 01/25/2020 was negative.  Because of ambulatory dysfunction and generalized weakness discharge to SNF for PT/OT was pursued.  Past medical and surgical history: Includes CAD with history of MI, CKD, GERD, dyslipidemia, hypothyroidism, and diabetes with CKD. Surgeries include CAD stent placement and lumbar laminectomy.  Social history: Former smoker, nondrinker at present.  Family history: Family history is noncontributory due to his advanced age.  His mother did have Alzheimer's disease.   Review of systems:  Could not be completed due to nonverbal state.  Physical exam:  Pertinent or positive findings:   He lay in the left lateral decubitus position seemingly asleep.  His meal tray was present and untouched.  He would open his eyes but not focus.  He followed no  commands.  Hair is disheveled.  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  He would not keep his eyes open to examine pupillary response.  He would not open his mouth for intraoral exam.  He exhibited  a gallop rhythm.  He had minor diffuse low-grade rales posteriorly.  Dorsalis pulses are strong.  Posterior tibial pulses are decreased.  As stated he would follow no commands and strength could not be tested.  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  General appearance:  no acute distress, increased work of breathing is present.   Lymphatic: No lymphadenopathy about the head, neck, axilla. Eyes: No conjunctival inflammation or lid edema is present. There is no scleral icterus. Ears:  External ear exam shows no significant lesions or deformities.   Nose:  External nasal examination shows no deformity or inflammation. Nasal mucosa are pink and moist without lesions, exudates Neck:  No thyromegaly, masses, tenderness noted.    Heart:  No murmur, click, rub.  Lungs:  without wheezes, rhonchi, rubs. Abdomen: Bowel sounds are normal.  Abdomen is soft and nontender with no organomegaly, hernias, masses. GU: Deferred  Extremities:  No cyanosis, clubbing, edema. Neurologic exam: Balance, Rhomberg, finger to nose testing could not be completed due to clinical state Skin: Warm & dry. No significant lesions or rash.  See clinical summary under each active problem in the Problem List with associated updated therapeutic plan

## 2020-02-08 NOTE — Assessment & Plan Note (Signed)
Avoid nephrotoxic medications and monitor BMET

## 2020-02-08 NOTE — Patient Instructions (Signed)
See assessment and plan under each diagnosis in the problem list and acutely for this visit 

## 2020-02-08 NOTE — Assessment & Plan Note (Signed)
No bleeding dyscrasias reported , but CBC willl be rechecked

## 2020-02-08 NOTE — Assessment & Plan Note (Signed)
Repeat PCR negative

## 2020-02-08 NOTE — Assessment & Plan Note (Signed)
With IV cefepime and IV fluid resuscitation return to baseline of probable vascular dementia.

## 2020-02-08 NOTE — Assessment & Plan Note (Addendum)
Current A1c is 7.3% which indicates good control in this [age]ogenarian with multiple advanced comorbidities.  Minimum goal would be less than 8% with absence of any hypoglycemic spells.  In that vein, SSI was discontinued here at the SNF.

## 2020-02-09 ENCOUNTER — Encounter: Payer: Self-pay | Admitting: Adult Health

## 2020-02-09 ENCOUNTER — Other Ambulatory Visit (HOSPITAL_COMMUNITY)
Admission: RE | Admit: 2020-02-09 | Discharge: 2020-02-09 | Disposition: A | Discharge status: Home / Self Care | Payer: Medicare Other | Source: Skilled Nursing Facility | Attending: *Deleted | Admitting: *Deleted

## 2020-02-09 ENCOUNTER — Non-Acute Institutional Stay (SKILLED_NURSING_FACILITY): Payer: Medicare Other | Admitting: Adult Health

## 2020-02-09 DIAGNOSIS — Z8616 Personal history of COVID-19: Secondary | ICD-10-CM | POA: Insufficient documentation

## 2020-02-09 DIAGNOSIS — U071 COVID-19: Secondary | ICD-10-CM

## 2020-02-09 DIAGNOSIS — F0631 Mood disorder due to known physiological condition with depressive features: Secondary | ICD-10-CM | POA: Insufficient documentation

## 2020-02-09 DIAGNOSIS — Z09 Encounter for follow-up examination after completed treatment for conditions other than malignant neoplasm: Secondary | ICD-10-CM | POA: Insufficient documentation

## 2020-02-09 DIAGNOSIS — E1122 Type 2 diabetes mellitus with diabetic chronic kidney disease: Secondary | ICD-10-CM | POA: Insufficient documentation

## 2020-02-09 DIAGNOSIS — F0150 Vascular dementia without behavioral disturbance: Secondary | ICD-10-CM | POA: Insufficient documentation

## 2020-02-09 DIAGNOSIS — E876 Hypokalemia: Secondary | ICD-10-CM

## 2020-02-09 DIAGNOSIS — M48061 Spinal stenosis, lumbar region without neurogenic claudication: Secondary | ICD-10-CM | POA: Insufficient documentation

## 2020-02-09 DIAGNOSIS — R6510 Systemic inflammatory response syndrome (SIRS) of non-infectious origin without acute organ dysfunction: Secondary | ICD-10-CM | POA: Diagnosis not present

## 2020-02-09 DIAGNOSIS — I2699 Other pulmonary embolism without acute cor pulmonale: Secondary | ICD-10-CM | POA: Diagnosis not present

## 2020-02-09 DIAGNOSIS — D631 Anemia in chronic kidney disease: Secondary | ICD-10-CM | POA: Insufficient documentation

## 2020-02-09 DIAGNOSIS — E785 Hyperlipidemia, unspecified: Secondary | ICD-10-CM | POA: Insufficient documentation

## 2020-02-09 DIAGNOSIS — K219 Gastro-esophageal reflux disease without esophagitis: Secondary | ICD-10-CM | POA: Insufficient documentation

## 2020-02-09 DIAGNOSIS — Z79899 Other long term (current) drug therapy: Secondary | ICD-10-CM | POA: Insufficient documentation

## 2020-02-09 DIAGNOSIS — R6511 Systemic inflammatory response syndrome (SIRS) of non-infectious origin with acute organ dysfunction: Secondary | ICD-10-CM | POA: Insufficient documentation

## 2020-02-09 DIAGNOSIS — Z794 Long term (current) use of insulin: Secondary | ICD-10-CM | POA: Insufficient documentation

## 2020-02-09 DIAGNOSIS — I10 Essential (primary) hypertension: Secondary | ICD-10-CM | POA: Insufficient documentation

## 2020-02-09 DIAGNOSIS — N183 Chronic kidney disease, stage 3 unspecified: Secondary | ICD-10-CM | POA: Insufficient documentation

## 2020-02-09 DIAGNOSIS — I2510 Atherosclerotic heart disease of native coronary artery without angina pectoris: Secondary | ICD-10-CM | POA: Insufficient documentation

## 2020-02-09 DIAGNOSIS — R4182 Altered mental status, unspecified: Secondary | ICD-10-CM | POA: Diagnosis not present

## 2020-02-09 LAB — COMPREHENSIVE METABOLIC PANEL
ALT: 39 U/L (ref 0–44)
AST: 26 U/L (ref 15–41)
Albumin: 2.3 g/dL — ABNORMAL LOW (ref 3.5–5.0)
Alkaline Phosphatase: 93 U/L (ref 38–126)
Anion gap: 15 (ref 5–15)
BUN: 21 mg/dL (ref 8–23)
CO2: 22 mmol/L (ref 22–32)
Calcium: 8.5 mg/dL — ABNORMAL LOW (ref 8.9–10.3)
Chloride: 100 mmol/L (ref 98–111)
Creatinine, Ser: 1.2 mg/dL (ref 0.61–1.24)
GFR, Estimated: 60 mL/min — ABNORMAL LOW (ref >60)
Glucose, Bld: 113 mg/dL — ABNORMAL HIGH (ref 70–99)
Potassium: 3.4 mmol/L — ABNORMAL LOW (ref 3.5–5.1)
Sodium: 137 mmol/L (ref 135–145)
Total Bilirubin: 0.2 mg/dL — ABNORMAL LOW (ref 0.3–1.2)
Total Protein: 6.1 g/dL — ABNORMAL LOW (ref 6.5–8.1)

## 2020-02-09 LAB — CBC WITH DIFFERENTIAL/PLATELET
Abs Immature Granulocytes: 0.18 10*3/uL — ABNORMAL HIGH (ref 0.00–0.07)
Basophils Absolute: 0 10*3/uL (ref 0.0–0.1)
Basophils Relative: 1 %
Eosinophils Absolute: 0.2 10*3/uL (ref 0.0–0.5)
Eosinophils Relative: 3 %
HCT: 32.4 % — ABNORMAL LOW (ref 39.0–52.0)
Hemoglobin: 10.3 g/dL — ABNORMAL LOW (ref 13.0–17.0)
Immature Granulocytes: 2 %
Lymphocytes Relative: 17 %
Lymphs Abs: 1.4 10*3/uL (ref 0.7–4.0)
MCH: 30.9 pg (ref 26.0–34.0)
MCHC: 31.8 g/dL (ref 30.0–36.0)
MCV: 97.3 fL (ref 80.0–100.0)
Monocytes Absolute: 0.8 10*3/uL (ref 0.1–1.0)
Monocytes Relative: 10 %
Neutro Abs: 5.7 10*3/uL (ref 1.7–7.7)
Neutrophils Relative %: 67 %
Platelets: 466 10*3/uL — ABNORMAL HIGH (ref 150–400)
RBC: 3.33 MIL/uL — ABNORMAL LOW (ref 4.22–5.81)
RDW: 13.1 % (ref 11.5–15.5)
WBC: 8.4 10*3/uL (ref 4.0–10.5)
nRBC: 0 % (ref 0.0–0.2)

## 2020-02-09 NOTE — Progress Notes (Signed)
Location:  [place] Room Number: 127/D Place of Service:  SNF (31)   CODE STATUS: Full Code  Allergies  Allergen Reactions  . Hydrocodone     Other reaction(s): Confusion, Hallucinations  . Mobic [Meloxicam] Other (See Comments)    Mucus    Chief Complaint  Patient presents with  . Follow-up    Follow Up Status    HPI:  He remains weak; with difficulty participating in therapy. His k+ level is 3.4. there are no reports of difficulty breathing; no cough; no change in appetite. Staff is concerned that something is wrong he is not himself.   Past Medical History:  Diagnosis Date  . Arthritis   . Chronic kidney disease   . Constipation    uses stool softener daily  . GERD (gastroesophageal reflux disease)   . Hypercholesteremia   . Hypertension   . Hypothyroidism   . Myocardial infarction [facility]) 2009   Dr. Cathren Harsh cardiologist 2 stents    Past Surgical History:  Procedure Laterality Date  . APPENDECTOMY    . BACK SURGERY     several   . CARDIAC CATHETERIZATION    . CORONARY ANGIOPLASTY     3 years ago  . LUMBAR LAMINECTOMY/ DECOMPRESSION WITH MET-RX N/A 03/05/2015   Procedure: Removal of posterior hardware Lumbar one with Metrex;  Surgeon: Kristeen Miss, MD;  Location: [place] NEURO ORS;  Service: Neurosurgery;  Laterality: N/A;  . SKIN SURGERY     skin cancer removed from forehead    Social History   Socioeconomic History  . Marital status: Widowed    Spouse name: Not on file  . Number of children: Not on file  . Years of education: Not on file  . Highest education level: Not on file  Occupational History  . Not on file  Tobacco Use  . Smoking status: Former Smoker    Start date: 01/20/1955    Quit date: 01/20/1960    Years since quitting: 60.0  . Smokeless tobacco: Never Used  Vaping Use  . Vaping Use: Never used  Substance and Sexual Activity  . Alcohol use: No    Alcohol/week: 0.0 standard drinks  . Drug use: No  .  Sexual activity: Not on file  Other Topics Concern  . Not on file  Social History Narrative  . Not on file   Social Determinants of Health   Financial Resource Strain: Not on file  Food Insecurity: Not on file  Transportation Needs: Not on file  Physical Activity: Not on file  Stress: Not on file  Social Connections: Not on file  Intimate Partner Violence: Not on file   Family History  Problem Relation Age of Onset  . Alzheimer's disease Mother       VITAL SIGNS BP (!) 141/85   Pulse 94   Temp 98 F (36.7 C)   Resp 20   Ht 6' (1.829 m)   Wt 196 lb (88.9 kg)   SpO2 91%   BMI 26.58 kg/m   Outpatient Encounter Medications as of 02/09/2020  Medication Sig  . acetaminophen (TYLENOL) 325 MG tablet Take 2 tablets (650 mg total) by mouth every 6 (six) hours as needed for mild pain or headache (or Fever >/= 101).  . Amino Acids-Protein Hydrolys (FEEDING SUPPLEMENT, PRO-STAT SUGAR FREE 64,) LIQD Take 30 mLs by mouth 3 (three) times daily with meals.  Marland Kitchen aspirin EC 81 MG tablet Take 1 tablet (81 mg total) by mouth daily with  breakfast.  . citalopram (CELEXA) 20 MG tablet Take 20 mg by mouth daily.  . ferrous sulfate 325 (65 FE) MG EC tablet Take 1 tablet (325 mg total) by mouth 2 (two) times daily with a meal.  . insulin aspart (NOVOLOG FLEXPEN) 100 UNIT/ML FlexPen Inject 5 Units into the skin 3 (three) times daily with meals. Special instructions: If accu-check is >200 . Hold for accu-check 200 or less  . levothyroxine (SYNTHROID) 75 MCG tablet Take 1 tablet (75 mcg total) by mouth daily before breakfast.  . metoprolol tartrate (LOPRESSOR) 25 MG tablet Take 1 tablet (25 mg total) by mouth 2 (two) times daily.  . ondansetron (ZOFRAN) 4 MG tablet Take 1 tablet (4 mg total) by mouth every 8 (eight) hours as needed for nausea.  . pantoprazole (PROTONIX) 40 MG tablet Take 1 tablet (40 mg total) by mouth daily.  . polyethylene glycol (MIRALAX / GLYCOLAX) 17 g packet Take 17 g by mouth  daily.  . pravastatin (PRAVACHOL) 40 MG tablet Take 1 tablet (40 mg total) by mouth at bedtime.  Marland Kitchen tiZANidine (ZANAFLEX) 2 MG tablet Take 1 tablet (2 mg total) by mouth every 12 (twelve) hours as needed for muscle spasms (back pain).  Marland Kitchen UNABLE TO FIND Diet: NAS & Consistent Carbohydrate   No facility-administered encounter medications on file as of 02/09/2020.     SIGNIFICANT DIAGNOSTIC EXAM   PREVIOUS    01-31-20: ct head: No acute intracranial hemorrhage, mass effect, or evidence of acute infarction. Advanced chronic microvascular ischemic changes.  01-31-20: chest x-ray: 1. No interval change. 2. Low lung volumes. 3. Mild LEFT basilar atelectasis  NO NEW EXAMS.   LABS REVIEWED: PREVIOUS   01-11-20: hgb a1c 7.3 01-16-20: tsh 3.532 01-31-20: wbc 18.2; hgb 10.5; hct 32.9; mcv 99.4 plt 479; glucose 202; bun 63; creat 2.52; k+ 4.0; na++ 133; ca 8.9; ALT 58; albumin 2.9 GFR 24; blood and urine culture: no growth; iron 25; tibc 221; ferritin 859; vit B 12: 218 folate 11.5  02-02-20: wbc 8.8 hgb 8.2 ;hct 26.6; mcv 101.1 plt 294; glucose 116; bun 32; creat 1.30; na++ 134; ca 7.9; phos 2.7 albumin 2.1  02-05-20: glucose 108; bun 18; creat 1.08; k+ 3.6; na++ 134; ca 8.4 GFR>60   TODAY  02-09-20: glucose 8.4; hgb 10.3; hct 32.4 mcv 97.3 plt 466 glucose 113; bun 21; creat 1.20; k+ 3.4; na++ 137; ca 8.5 liver normal albumin 2.3 GFR 60    Review of Systems  Unable to perform ROS: Dementia (unable to participate )   Physical Exam Constitutional:      General: He is not in acute distress.    Appearance: He is well-developed and well-nourished. He is not diaphoretic.  Neck:     Thyroid: No thyromegaly.     Vascular: Carotid bruit present.  Cardiovascular:     Rate and Rhythm: Normal rate and regular rhythm.     Pulses: Intact distal pulses.     Heart sounds: Normal heart sounds.  Pulmonary:     Effort: Pulmonary effort is normal. No respiratory distress.     Breath sounds: Normal breath  sounds.  Abdominal:     General: Bowel sounds are normal. There is no distension.     Palpations: Abdomen is soft.     Tenderness: There is no abdominal tenderness.  Genitourinary:    Comments: Foley  Musculoskeletal:        General: No edema.     Right lower leg: No edema.  Left lower leg: No edema.     Comments: Is able to move all extremities  Lymphadenopathy:     Cervical: No cervical adenopathy.  Skin:    General: Skin is warm and dry.  Neurological:     Mental Status: He is alert. Mental status is at baseline.  Psychiatric:        Mood and Affect: Mood and affect and mood normal.      ASSESSMENT/ PLAN:  TODAY  1. SIRS (systemic inflammation response syndrome)/history of covid 01-09-20 now negative; is without change; he does have weakness present. Will continue to monitor his status.   2. Hypokalemia: is worse k+ 3.4 will give one time k+ 40 meq tab    MD is aware of resident's narcotic use and is in agreement with current plan of care. We will attempt to wean resident as appropriate.  Ok Edwards NP [facility]  Contact [phone] Monday through Friday 8am- 5pm  After hours call [phone]

## 2020-02-11 ENCOUNTER — Other Ambulatory Visit: Payer: Self-pay

## 2020-02-11 ENCOUNTER — Inpatient Hospital Stay (HOSPITAL_COMMUNITY)
Admission: EM | Admit: 2020-02-11 | Discharge: 2020-02-19 | DRG: 175 | Disposition: A | Discharge status: Home Health | Payer: Medicare Other | Attending: Internal Medicine | Admitting: Internal Medicine

## 2020-02-11 ENCOUNTER — Emergency Department (HOSPITAL_COMMUNITY): Payer: Medicare Other

## 2020-02-11 ENCOUNTER — Encounter (HOSPITAL_COMMUNITY): Payer: Self-pay

## 2020-02-11 DIAGNOSIS — K5900 Constipation, unspecified: Secondary | ICD-10-CM | POA: Diagnosis present

## 2020-02-11 DIAGNOSIS — E1122 Type 2 diabetes mellitus with diabetic chronic kidney disease: Secondary | ICD-10-CM | POA: Diagnosis present

## 2020-02-11 DIAGNOSIS — Z885 Allergy status to narcotic agent status: Secondary | ICD-10-CM

## 2020-02-11 DIAGNOSIS — N183 Chronic kidney disease, stage 3 unspecified: Secondary | ICD-10-CM | POA: Diagnosis present

## 2020-02-11 DIAGNOSIS — Z7982 Long term (current) use of aspirin: Secondary | ICD-10-CM

## 2020-02-11 DIAGNOSIS — Z79899 Other long term (current) drug therapy: Secondary | ICD-10-CM

## 2020-02-11 DIAGNOSIS — R627 Adult failure to thrive: Secondary | ICD-10-CM | POA: Diagnosis present

## 2020-02-11 DIAGNOSIS — Z20822 Contact with and (suspected) exposure to covid-19: Secondary | ICD-10-CM | POA: Diagnosis present

## 2020-02-11 DIAGNOSIS — I129 Hypertensive chronic kidney disease with stage 1 through stage 4 chronic kidney disease, or unspecified chronic kidney disease: Secondary | ICD-10-CM | POA: Diagnosis present

## 2020-02-11 DIAGNOSIS — E039 Hypothyroidism, unspecified: Secondary | ICD-10-CM | POA: Diagnosis present

## 2020-02-11 DIAGNOSIS — G9341 Metabolic encephalopathy: Secondary | ICD-10-CM | POA: Diagnosis present

## 2020-02-11 DIAGNOSIS — I252 Old myocardial infarction: Secondary | ICD-10-CM

## 2020-02-11 DIAGNOSIS — I2699 Other pulmonary embolism without acute cor pulmonale: Principal | ICD-10-CM | POA: Diagnosis present

## 2020-02-11 DIAGNOSIS — F0150 Vascular dementia without behavioral disturbance: Secondary | ICD-10-CM | POA: Diagnosis present

## 2020-02-11 DIAGNOSIS — G834 Cauda equina syndrome: Secondary | ICD-10-CM | POA: Diagnosis present

## 2020-02-11 DIAGNOSIS — M1990 Unspecified osteoarthritis, unspecified site: Secondary | ICD-10-CM | POA: Diagnosis present

## 2020-02-11 DIAGNOSIS — Z87891 Personal history of nicotine dependence: Secondary | ICD-10-CM

## 2020-02-11 DIAGNOSIS — K219 Gastro-esophageal reflux disease without esophagitis: Secondary | ICD-10-CM | POA: Diagnosis present

## 2020-02-11 DIAGNOSIS — E872 Acidosis: Secondary | ICD-10-CM | POA: Diagnosis present

## 2020-02-11 DIAGNOSIS — E7800 Pure hypercholesterolemia, unspecified: Secondary | ICD-10-CM | POA: Diagnosis present

## 2020-02-11 DIAGNOSIS — E785 Hyperlipidemia, unspecified: Secondary | ICD-10-CM | POA: Diagnosis present

## 2020-02-11 DIAGNOSIS — Z794 Long term (current) use of insulin: Secondary | ICD-10-CM

## 2020-02-11 DIAGNOSIS — R4182 Altered mental status, unspecified: Secondary | ICD-10-CM | POA: Diagnosis present

## 2020-02-11 DIAGNOSIS — N179 Acute kidney failure, unspecified: Secondary | ICD-10-CM | POA: Diagnosis present

## 2020-02-11 DIAGNOSIS — Z955 Presence of coronary angioplasty implant and graft: Secondary | ICD-10-CM

## 2020-02-11 DIAGNOSIS — E1169 Type 2 diabetes mellitus with other specified complication: Secondary | ICD-10-CM | POA: Diagnosis present

## 2020-02-11 DIAGNOSIS — Z85828 Personal history of other malignant neoplasm of skin: Secondary | ICD-10-CM

## 2020-02-11 DIAGNOSIS — E860 Dehydration: Secondary | ICD-10-CM | POA: Diagnosis present

## 2020-02-11 DIAGNOSIS — Z8616 Personal history of COVID-19: Secondary | ICD-10-CM | POA: Diagnosis not present

## 2020-02-11 DIAGNOSIS — I248 Other forms of acute ischemic heart disease: Secondary | ICD-10-CM | POA: Diagnosis present

## 2020-02-11 DIAGNOSIS — I2510 Atherosclerotic heart disease of native coronary artery without angina pectoris: Secondary | ICD-10-CM | POA: Diagnosis present

## 2020-02-11 DIAGNOSIS — R339 Retention of urine, unspecified: Secondary | ICD-10-CM | POA: Diagnosis present

## 2020-02-11 DIAGNOSIS — H9190 Unspecified hearing loss, unspecified ear: Secondary | ICD-10-CM | POA: Diagnosis present

## 2020-02-11 DIAGNOSIS — J9601 Acute respiratory failure with hypoxia: Secondary | ICD-10-CM | POA: Diagnosis present

## 2020-02-11 DIAGNOSIS — Z886 Allergy status to analgesic agent status: Secondary | ICD-10-CM

## 2020-02-11 DIAGNOSIS — Z820 Family history of epilepsy and other diseases of the nervous system: Secondary | ICD-10-CM

## 2020-02-11 DIAGNOSIS — Z7189 Other specified counseling: Secondary | ICD-10-CM | POA: Diagnosis not present

## 2020-02-11 DIAGNOSIS — D509 Iron deficiency anemia, unspecified: Secondary | ICD-10-CM | POA: Diagnosis present

## 2020-02-11 DIAGNOSIS — Z515 Encounter for palliative care: Secondary | ICD-10-CM | POA: Diagnosis not present

## 2020-02-11 DIAGNOSIS — Z79890 Hormone replacement therapy (postmenopausal): Secondary | ICD-10-CM

## 2020-02-11 DIAGNOSIS — R9431 Abnormal electrocardiogram [ECG] [EKG]: Secondary | ICD-10-CM | POA: Diagnosis not present

## 2020-02-11 HISTORY — DX: Presence of other specified devices: Z97.8

## 2020-02-11 LAB — BLOOD GAS, ARTERIAL
Acid-Base Excess: 1.1 mmol/L (ref 0.0–2.0)
Acid-base deficit: 2.1 mmol/L — ABNORMAL HIGH (ref 0.0–2.0)
Bicarbonate: 23.8 mmol/L (ref 20.0–28.0)
Bicarbonate: 23 mmol/L (ref 20.0–28.0)
FIO2: 21
FIO2: 24
O2 Saturation: 11.6 %
O2 Saturation: 96.2 %
Patient temperature: 36.6
Patient temperature: 36.9
pCO2 arterial: 42.6 mmHg (ref 32.0–48.0)
pCO2 arterial: 30.1 mmHg — ABNORMAL LOW (ref 32.0–48.0)
pH, Arterial: 7.393 (ref 7.350–7.450)
pH, Arterial: 7.461 — ABNORMAL HIGH (ref 7.350–7.450)
pO2, Arterial: <31 mmHg — CL (ref 83.0–108.0)
pO2, Arterial: 80.8 mmHg — ABNORMAL LOW (ref 83.0–108.0)

## 2020-02-11 LAB — T4, FREE: Free T4: 1.15 ng/dL — ABNORMAL HIGH (ref 0.61–1.12)

## 2020-02-11 LAB — COMPREHENSIVE METABOLIC PANEL
ALT: 37 U/L (ref 0–44)
AST: 29 U/L (ref 15–41)
Albumin: 2.5 g/dL — ABNORMAL LOW (ref 3.5–5.0)
Alkaline Phosphatase: 110 U/L (ref 38–126)
Anion gap: 11 (ref 5–15)
BUN: 21 mg/dL (ref 8–23)
CO2: 23 mmol/L (ref 22–32)
Calcium: 8.8 mg/dL — ABNORMAL LOW (ref 8.9–10.3)
Chloride: 99 mmol/L (ref 98–111)
Creatinine, Ser: 1.2 mg/dL (ref 0.61–1.24)
GFR, Estimated: 60 mL/min — ABNORMAL LOW (ref >60)
Glucose, Bld: 135 mg/dL — ABNORMAL HIGH (ref 70–99)
Potassium: 3.6 mmol/L (ref 3.5–5.1)
Sodium: 133 mmol/L — ABNORMAL LOW (ref 135–145)
Total Bilirubin: 0.4 mg/dL (ref 0.3–1.2)
Total Protein: 6.5 g/dL (ref 6.5–8.1)

## 2020-02-11 LAB — URINALYSIS, ROUTINE W REFLEX MICROSCOPIC
Bilirubin Urine: NEGATIVE
Glucose, UA: 50 mg/dL — AB
Hgb urine dipstick: NEGATIVE
Ketones, ur: 5 mg/dL — AB
Leukocytes,Ua: NEGATIVE
Nitrite: NEGATIVE
Protein, ur: 30 mg/dL — AB
Specific Gravity, Urine: 1.026 (ref 1.005–1.030)
pH: 5 (ref 5.0–8.0)

## 2020-02-11 LAB — CBC WITH DIFFERENTIAL/PLATELET
Abs Immature Granulocytes: 0.21 10*3/uL — ABNORMAL HIGH (ref 0.00–0.07)
Basophils Absolute: 0.1 10*3/uL (ref 0.0–0.1)
Basophils Relative: 1 %
Eosinophils Absolute: 0.2 10*3/uL (ref 0.0–0.5)
Eosinophils Relative: 2 %
HCT: 34.2 % — ABNORMAL LOW (ref 39.0–52.0)
Hemoglobin: 10.9 g/dL — ABNORMAL LOW (ref 13.0–17.0)
Immature Granulocytes: 2 %
Lymphocytes Relative: 17 %
Lymphs Abs: 1.6 10*3/uL (ref 0.7–4.0)
MCH: 31.5 pg (ref 26.0–34.0)
MCHC: 31.9 g/dL (ref 30.0–36.0)
MCV: 98.8 fL (ref 80.0–100.0)
Monocytes Absolute: 0.9 10*3/uL (ref 0.1–1.0)
Monocytes Relative: 9 %
Neutro Abs: 6.5 10*3/uL (ref 1.7–7.7)
Neutrophils Relative %: 69 %
Platelets: 643 10*3/uL — ABNORMAL HIGH (ref 150–400)
RBC: 3.46 MIL/uL — ABNORMAL LOW (ref 4.22–5.81)
RDW: 13.2 % (ref 11.5–15.5)
WBC: 9.4 10*3/uL (ref 4.0–10.5)
nRBC: 0 % (ref 0.0–0.2)

## 2020-02-11 LAB — TSH: TSH: 6.386 u[IU]/mL — ABNORMAL HIGH (ref 0.350–4.500)

## 2020-02-11 LAB — RAPID URINE DRUG SCREEN, HOSP PERFORMED
Amphetamines: NOT DETECTED
Barbiturates: NOT DETECTED
Benzodiazepines: NOT DETECTED
Cocaine: NOT DETECTED
Opiates: NOT DETECTED
Tetrahydrocannabinol: NOT DETECTED

## 2020-02-11 LAB — LACTIC ACID, PLASMA
Lactic Acid, Venous: 2.7 mmol/L (ref 0.5–1.9)
Lactic Acid, Venous: 1.5 mmol/L (ref 0.5–1.9)

## 2020-02-11 LAB — AMMONIA: Ammonia: 16 umol/L (ref 9–35)

## 2020-02-11 LAB — TROPONIN I (HIGH SENSITIVITY): Troponin I (High Sensitivity): 324 ng/L (ref <18)

## 2020-02-11 MED ORDER — HEPARIN (PORCINE) 25000 UT/250ML-% IV SOLN
INTRAVENOUS | Status: AC
  Administered 2020-02-11: 1400 [IU]/h via INTRAVENOUS
  Filled 2020-02-11: qty 250

## 2020-02-11 MED ORDER — SODIUM CHLORIDE 0.9 % IV BOLUS
1000.0000 mL | Freq: Once | INTRAVENOUS | Status: AC
  Administered 2020-02-11: 1000 mL via INTRAVENOUS

## 2020-02-11 MED ORDER — ONDANSETRON HCL 4 MG PO TABS: 4.0000 mg | ORAL_TABLET | Freq: Four times a day (QID) | ORAL | Status: DC | PRN

## 2020-02-11 MED ORDER — LEVOTHYROXINE SODIUM 75 MCG PO TABS
75.0000 ug | ORAL_TABLET | Freq: Every day | ORAL | Status: DC
  Administered 2020-02-19: 75 ug via ORAL
  Filled 2020-02-11 (×5): qty 1

## 2020-02-11 MED ORDER — INSULIN ASPART 100 UNIT/ML ~~LOC~~ SOLN
0.0000 [IU] | Freq: Three times a day (TID) | SUBCUTANEOUS | Status: DC
  Administered 2020-02-14: 2 [IU] via SUBCUTANEOUS
  Administered 2020-02-15: 1 [IU] via SUBCUTANEOUS
  Administered 2020-02-16 – 2020-02-17 (×2): 2 [IU] via SUBCUTANEOUS
  Administered 2020-02-18: 1 [IU] via SUBCUTANEOUS

## 2020-02-11 MED ORDER — ACETAMINOPHEN 650 MG RE SUPP: 650.0000 mg | Freq: Four times a day (QID) | RECTAL | Status: DC | PRN

## 2020-02-11 MED ORDER — LORAZEPAM 2 MG/ML IJ SOLN: 0.5000 mg | Freq: Once | INTRAMUSCULAR | Status: DC

## 2020-02-11 MED ORDER — ACETAMINOPHEN 325 MG PO TABS
650.0000 mg | ORAL_TABLET | Freq: Four times a day (QID) | ORAL | Status: DC | PRN
  Administered 2020-02-14 – 2020-02-17 (×2): 650 mg via ORAL
  Filled 2020-02-11 (×2): qty 2

## 2020-02-11 MED ORDER — LORAZEPAM 2 MG/ML IJ SOLN
INTRAMUSCULAR | Status: AC
  Administered 2020-02-11: 0.5 mg via INTRAVENOUS
  Filled 2020-02-11: qty 1

## 2020-02-11 MED ORDER — PRAVASTATIN SODIUM 40 MG PO TABS
40.0000 mg | ORAL_TABLET | Freq: Every day | ORAL | Status: DC
  Administered 2020-02-13 – 2020-02-18 (×5): 40 mg via ORAL
  Filled 2020-02-11 (×6): qty 1

## 2020-02-11 MED ORDER — ONDANSETRON HCL 4 MG/2ML IJ SOLN: 4.0000 mg | Freq: Four times a day (QID) | INTRAMUSCULAR | Status: DC | PRN

## 2020-02-11 MED ORDER — IOHEXOL 350 MG/ML SOLN
75.0000 mL | Freq: Once | INTRAVENOUS | Status: AC | PRN
  Administered 2020-02-11: 75 mL via INTRAVENOUS

## 2020-02-11 MED ORDER — TAMSULOSIN HCL 0.4 MG PO CAPS
0.4000 mg | ORAL_CAPSULE | Freq: Every day | ORAL | Status: DC
  Administered 2020-02-13 – 2020-02-18 (×4): 0.4 mg via ORAL
  Filled 2020-02-11 (×6): qty 1

## 2020-02-11 MED ORDER — PANTOPRAZOLE SODIUM 40 MG PO TBEC
40.0000 mg | DELAYED_RELEASE_TABLET | Freq: Every day | ORAL | Status: DC
  Administered 2020-02-12 – 2020-02-19 (×7): 40 mg via ORAL
  Filled 2020-02-11 (×8): qty 1

## 2020-02-11 MED ORDER — LORAZEPAM 2 MG/ML IJ SOLN: 0.5000 mg | Freq: Once | INTRAMUSCULAR | Status: AC

## 2020-02-11 MED ORDER — HEPARIN (PORCINE) 25000 UT/250ML-% IV SOLN
1250.0000 [IU]/h | INTRAVENOUS | Status: DC
  Filled 2020-02-11: qty 250

## 2020-02-11 MED ORDER — ASPIRIN EC 81 MG PO TBEC
81.0000 mg | DELAYED_RELEASE_TABLET | Freq: Every day | ORAL | Status: DC
  Administered 2020-02-12 – 2020-02-19 (×7): 81 mg via ORAL
  Filled 2020-02-11 (×8): qty 1

## 2020-02-11 MED ORDER — INSULIN ASPART 100 UNIT/ML ~~LOC~~ SOLN: 0.0000 [IU] | Freq: Every day | SUBCUTANEOUS | Status: DC

## 2020-02-11 MED ORDER — SODIUM CHLORIDE 0.9 % IV SOLN: INTRAVENOUS | Status: DC

## 2020-02-11 MED ORDER — LORAZEPAM BOLUS VIA INFUSION: 0.5000 mg | Freq: Once | INTRAVENOUS | Status: DC

## 2020-02-11 MED ORDER — SODIUM CHLORIDE 0.9 % IV SOLN
1000.0000 mL | Freq: Once | INTRAVENOUS | Status: AC
  Administered 2020-02-11: 1000 mL via INTRAVENOUS

## 2020-02-11 MED ORDER — FERROUS SULFATE 325 (65 FE) MG PO TABS
325.0000 mg | ORAL_TABLET | Freq: Two times a day (BID) | ORAL | Status: DC
  Administered 2020-02-12 – 2020-02-14 (×4): 325 mg via ORAL
  Filled 2020-02-11 (×4): qty 1

## 2020-02-11 MED ORDER — POLYETHYLENE GLYCOL 3350 17 G PO PACK
17.0000 g | PACK | Freq: Every day | ORAL | Status: DC
  Administered 2020-02-12 – 2020-02-19 (×7): 17 g via ORAL
  Filled 2020-02-11 (×8): qty 1

## 2020-02-11 MED ORDER — HEPARIN BOLUS VIA INFUSION
4000.0000 [IU] | Freq: Once | INTRAVENOUS | Status: AC
  Administered 2020-02-11: 4000 [IU] via INTRAVENOUS

## 2020-02-11 MED ORDER — METOPROLOL TARTRATE 25 MG PO TABS
25.0000 mg | ORAL_TABLET | Freq: Two times a day (BID) | ORAL | Status: DC
  Administered 2020-02-12 – 2020-02-19 (×12): 25 mg via ORAL
  Filled 2020-02-11 (×14): qty 1

## 2020-02-11 NOTE — ED Triage Notes (Addendum)
Report called from [facility].Pt with recent hospitalization with sepsis. Pt with on going poor appetite and altered mental status  daughter wanted pt to be evaluated. Pt lethargic. Oriented to person and place only

## 2020-02-11 NOTE — H&P (Addendum)
History and Physical    NILES ESS ZOX:[id number] DOB: Aug 08, 1933 DOA: 02/11/2020  PCP: Glenda Chroman, MD   Patient coming from:  [facility]  I have personally briefly reviewed patient's old medical records in [place]  Chief Complaint: AMS  HPI: Ronnie Gregory is a 87 y.o. male with medical history significant for coronary artery disease, hypertension, DM, hypothyroidism. Patient was brought to the ED from [place] home reports of altered mental status, and poor appetite.  At time of my evaluation, patient is lethargic, opens eyes to voice, verbalizing but not answering questions, hence history is obtained from chart review and from talking to nursing home staff. Per staff since patient was admitted there last week, he has had a flat affect, not responding much and not eating, so they do not know what his baseline is. No vomiting or loose stools. He has had a poor appetite.   Patients daughter- Jenny Reichmann reports that prior to hospitalization at [facility] patient lived alone, able to perform all ADLS, ambulated without assistance of assistive devices, and had very occasional episodes of sundowning otherwise no memory deficits. Daughter reports patient has lost sense of taste so he has not been eating. Foley was inserted 01/09/20 for urinary retention.  Hospitalization-1/12-1/17 for SIRS, acute metabolic encephalopathy and acute kidney injury.  Source of infection was not identified, patient was treated with a course of antibiotic, and hydrated.  He was discharged to nursing home  Hospitalization at [facility] on 01/09/2020- 01/25/20 with COVID-19 infection(vaccinated in the fall but no booster dose)--during hospitalization patient had confusion and disorientation,he was treated with steroids and remdesivir,he was found to be hypoxic received bronchodilators and supplemental oxygen, he was discharged to SNF rehab in [place] 3 L of oxygen, also started on Eliquis 2.5 mg  twice daily for 14 days.  ED Course: Temperature 97.8.  Heart rate 90-108, tachypneic to 28, O2 sats 90-94 percent on room air, placed on 2 L nasal cannula.  Lactic acid 2.7.  Troponin 324.  UA few bacteria 6-10 WBCs and mucus present.  ABG erroneously collected as venous sample showed pH of 7.3, Po2- < 31.  Head CT without acute abnormality.  CTA chest-right lung pulmonary embolism-with numerous filling defects, with some airspace consolidation in the right lower lobe which may reflect pulmonary hemorrhage from pulmonary infarction. IV heparin started. EDP critical care, Okay to admit here as patient's vitals are stable. Hospitalist to admit.  Review of Systems: Unable to ascertain due to altered mental status.  Past Medical History:  Diagnosis Date  . Arthritis   . Chronic kidney disease   . Constipation    uses stool softener daily  . Foley catheter in place    urinary retention  . GERD (gastroesophageal reflux disease)   . Hypercholesteremia   . Hypertension   . Hypothyroidism   . Myocardial infarction [facility]) 2009   Dr. Cathren Harsh cardiologist 2 stents    Past Surgical History:  Procedure Laterality Date  . APPENDECTOMY    . BACK SURGERY     several   . CARDIAC CATHETERIZATION    . CORONARY ANGIOPLASTY     3 years ago  . LUMBAR LAMINECTOMY/ DECOMPRESSION WITH MET-RX N/A 03/05/2015   Procedure: Removal of posterior hardware Lumbar one with Metrex;  Surgeon: Kristeen Miss, MD;  Location: [place] NEURO ORS;  Service: Neurosurgery;  Laterality: N/A;  . SKIN SURGERY     skin cancer removed from forehead     reports  that he quit smoking about 60 years ago. He started smoking about 65 years ago. He has never used smokeless tobacco. He reports that he does not drink alcohol and does not use drugs.  Allergies  Allergen Reactions  . Hydrocodone     Other reaction(s): Confusion, Hallucinations  . Mobic [Meloxicam] Other (See Comments)    Mucus    Family History  Problem  Relation Age of Onset  . Alzheimer's disease Mother     Prior to Admission medications   Medication Sig Start Date End Date Taking? Authorizing Provider  acetaminophen (TYLENOL) 325 MG tablet Take 2 tablets (650 mg total) by mouth every 6 (six) hours as needed for mild pain or headache (or Fever >/= 101). 02/05/20  Yes Mory Herrman, Courage, MD  Amino Acids-Protein Hydrolys (FEEDING SUPPLEMENT, PRO-STAT SUGAR FREE 64,) LIQD Take 30 mLs by mouth 3 (three) times daily with meals.   Yes [provider]  aspirin EC 81 MG tablet Take 1 tablet (81 mg total) by mouth daily with breakfast. 02/05/20 02/04/21 Yes Effa Yarrow, Courage, MD  citalopram (CELEXA) 20 MG tablet Take 20 mg by mouth daily. 11/01/19  Yes [provider]  ferrous sulfate 325 (65 FE) MG EC tablet Take 1 tablet (325 mg total) by mouth 2 (two) times daily with a meal. 02/05/20  Yes Keierra Nudo, Courage, MD  insulin aspart (NOVOLOG FLEXPEN) 100 UNIT/ML FlexPen Inject 5 Units into the skin 3 (three) times daily with meals. Special instructions: If accu-check is >200 . Hold for accu-check 200 or less   Yes [provider]  levothyroxine (SYNTHROID) 75 MCG tablet Take 1 tablet (75 mcg total) by mouth daily before breakfast. 02/05/20  Yes Vaanya Shambaugh, Courage, MD  magnesium hydroxide (MILK OF MAGNESIA) 400 MG/5ML suspension Take 30 mLs by mouth once.   Yes [provider]  metoprolol tartrate (LOPRESSOR) 25 MG tablet Take 1 tablet (25 mg total) by mouth 2 (two) times daily. 02/05/20  Yes Yarlin Breisch, Courage, MD  ondansetron (ZOFRAN) 4 MG tablet Take 1 tablet (4 mg total) by mouth every 8 (eight) hours as needed for nausea. 02/05/20  Yes Zain Bingman, Courage, MD  pantoprazole (PROTONIX) 40 MG tablet Take 1 tablet (40 mg total) by mouth daily. 02/06/20  Yes Carmon Sahli, Courage, MD  polyethylene glycol (MIRALAX / GLYCOLAX) 17 g packet Take 17 g by mouth daily. 02/05/20  Yes Tephanie Escorcia, Courage, MD  pravastatin (PRAVACHOL) 40 MG tablet Take 1 tablet  (40 mg total) by mouth at bedtime. 02/05/20  Yes Develle Sievers, Courage, MD  tiZANidine (ZANAFLEX) 2 MG tablet Take 1 tablet (2 mg total) by mouth every 12 (twelve) hours as needed for muscle spasms (back pain). 02/05/20  Yes Roxan Hockey, MD  UNABLE TO FIND Diet: NAS & Consistent Carbohydrate    [provider]    Physical Exam: Exam is limited by patient's altered mental status Vitals:   02/11/20 1729 02/11/20 1730 02/11/20 1800 02/11/20 1830  BP:  139/75 (!) 147/79 (!) 146/72  Pulse: 90  (!) 102   Resp:      Temp:      TempSrc:      SpO2: 97%  97%   Weight:      Height:        Constitutional: Lethargic, calm, comfortable Vitals:   02/11/20 1729 02/11/20 1730 02/11/20 1800 02/11/20 1830  BP:  139/75 (!) 147/79 (!) 146/72  Pulse: 90  (!) 102   Resp:      Temp:      TempSrc:  SpO2: 97%  97%   Weight:      Height:       Eyes: PERRL, lids and conjunctivae normal ENMT: Lips appear dry, with mild scaling/crusting, unable to examine mouth or oropharynx, patient not following directions.   Neck: normal, no masses, no thyromegaly Respiratory: Mild tachypnea, normal respiratory effort. No accessory muscle use.  Cardiovascular: Regular rate and rhythm, no murmurs / rubs / gallops. No extremity edema.  Abdomen: no tenderness, no masses palpated. No hepatosplenomegaly. Bowel sounds positive.  Foley catheter present, about 200 mils of yellowish concentrated appearing urine in urine bag. Musculoskeletal: no clubbing / cyanosis. No joint deformity upper and lower extremities. Good ROM, no contractures. Normal muscle tone.  Skin: no rashes, lesions, ulcers. No induration Neurologic: Exam limited by patient's mental status, patient not following directions to check strength in extremities, but no apparent facial asymmetry or voice change, he appears to be able to move all 4 extremities spontaneously at least to an extent. Psychiatric: Lethargic, requesting Dr. Malachi Bonds with ice in  response to all questions.  Labs on Admission: I have personally reviewed following labs and imaging studies  CBC: Recent Labs  Lab 02/05/20 0500 02/09/20 0430 02/11/20 1542  WBC 7.5 8.4 9.4  NEUTROABS  --  5.7 6.5  HGB 8.9* 10.3* 10.9*  HCT 27.7* 32.4* 34.2*  MCV 97.2 97.3 98.8  PLT 335 466* 630*   Basic Metabolic Panel: Recent Labs  Lab 02/05/20 0500 02/09/20 0430 02/11/20 1542  NA 134* 137 133*  K 3.6 3.4* 3.6  CL 101 100 99  CO2 23 22 23   GLUCOSE 108* 113* 135*  BUN 18 21 21   CREATININE 1.08 1.20 1.20  CALCIUM 8.4* 8.5* 8.8*   Liver Function Tests: Recent Labs  Lab 02/09/20 0430 02/11/20 1542  AST 26 29  ALT 39 37  ALKPHOS 93 110  BILITOT 0.2* 0.4  PROT 6.1* 6.5  ALBUMIN 2.3* 2.5*    Recent Labs  Lab 02/11/20 1542  AMMONIA 16   CBG: Recent Labs  Lab 02/04/20 2126 02/05/20 1053 02/05/20 1130 02/05/20 1634  GLUCAP 95 97 107* 101*   Urine analysis:    Component Value Date/Time   COLORURINE YELLOW 02/11/2020 1408   APPEARANCEUR HAZY (A) 02/11/2020 1408   LABSPEC 1.026 02/11/2020 1408   PHURINE 5.0 02/11/2020 1408   GLUCOSEU 50 (A) 02/11/2020 1408   HGBUR NEGATIVE 02/11/2020 1408   BILIRUBINUR NEGATIVE 02/11/2020 1408   KETONESUR 5 (A) 02/11/2020 1408   PROTEINUR 30 (A) 02/11/2020 1408   UROBILINOGEN 0.2 11/15/2014 1215   NITRITE NEGATIVE 02/11/2020 1408   LEUKOCYTESUR NEGATIVE 02/11/2020 1408    Radiological Exams on Admission: DG Chest 2 View  Result Date: 02/11/2020 CLINICAL DATA:  Altered mental status. EXAM: CHEST - 2 VIEW COMPARISON:  Chest radiograph 06/30/2020. FINDINGS: Stable cardiac and mediastinal contours. Low lung volumes. Bibasilar atelectasis. No large area pulmonary consolidation. No pleural effusion or pneumothorax. IMPRESSION: No active cardiopulmonary disease. Electronically Signed   By: Lovey Newcomer M.D.   On: 02/11/2020 15:01   CT Head Wo Contrast  Result Date: 02/11/2020 CLINICAL DATA:  Altered level of  consciousness, decreased appetite, lethargy EXAM: CT HEAD WITHOUT CONTRAST TECHNIQUE: Contiguous axial images were obtained from the base of the skull through the vertex without intravenous contrast. COMPARISON:  01/31/2020 FINDINGS: Brain: Stable confluent hypodensities throughout the periventricular and subcortical white matter consistent with chronic small vessel ischemic change. No sign of acute infarct or hemorrhage. Lateral ventricles and midline structures are  stable. No acute extra-axial fluid collections. No mass effect. Vascular: No hyperdense vessel or unexpected calcification. Skull: Normal. Negative for fracture or focal lesion. Sinuses/Orbits: No acute finding. Other: None. IMPRESSION: 1. Stable chronic small vessel ischemic changes. No acute intracranial process. Electronically Signed   By: Randa Ngo M.D.   On: 02/11/2020 14:56   CT ANGIO CHEST PE W OR WO CONTRAST  Result Date: 02/11/2020 CLINICAL DATA:  87 year old male with history of lethargy. Evaluate for pulmonary embolism. EXAM: CT ANGIOGRAPHY CHEST WITH CONTRAST TECHNIQUE: Multidetector CT imaging of the chest was performed using the standard protocol during bolus administration of intravenous contrast. Multiplanar CT image reconstructions and MIPs were obtained to evaluate the vascular anatomy. CONTRAST:  6m OMNIPAQUE IOHEXOL 350 MG/ML SOLN COMPARISON:  No priors. FINDINGS: Cardiovascular: There are numerous filling defects within the distal right main, lobar, segmental and subsegmental sized branches of the pulmonary arteries to the right lung, indicative of pulmonary embolism. No definite left-sided filling defects are noted. Heart size is normal. There is no significant pericardial fluid, thickening or pericardial calcification. There is aortic atherosclerosis, as well as atherosclerosis of the great vessels of the mediastinum and the coronary arteries, including calcified atherosclerotic plaque in the left anterior descending  and right coronary arteries. Mediastinum/Nodes: No pathologically enlarged mediastinal or hilar lymph nodes. Please note that accurate exclusion of hilar adenopathy is limited on noncontrast CT scans. Esophagus is unremarkable in appearance. No axillary lymphadenopathy. Lungs/Pleura: There is a small amount of dependent airspace consolidation in the right lower lobe. Lungs are otherwise clear. No suspicious appearing pulmonary nodules or masses are noted. No pleural effusions. Upper Abdomen: Aortic atherosclerosis. Musculoskeletal: There are no aggressive appearing lytic or blastic lesions noted in the visualized portions of the skeleton. Review of the MIP images confirms the above findings. IMPRESSION: 1. Study is positive for pulmonary embolism in the right lung. In addition, there is some airspace consolidation in the right lower lobe which may represent alveolar hemorrhage from pulmonary infarction. 2. Aortic atherosclerosis, in addition to 2 vessel coronary artery disease. Please note that although the presence of coronary artery calcium documents the presence of coronary artery disease, the severity of this disease and any potential stenosis cannot be assessed on this non-gated CT examination. Assessment for potential risk factor modification, dietary therapy or pharmacologic therapy may be warranted, if clinically indicated. Critical Value/emergent results were called by telephone at the time of interpretation on 02/11/2020 at 5:50 pm to provider ABIGAIL HARRIS , who verbally acknowledged these results. Aortic Atherosclerosis (ICD10-I70.0). Electronically Signed   By: DVinnie LangtonM.D.   On: 02/11/2020 17:51    EKG: Independently reviewed.  Sinus rhythm QTc 457.  No significant change from prior.  Assessment/Plan Principal Problem:   Pulmonary embolus (HCC) Active Problems:   S/P coronary artery stent placement   Acute metabolic encephalopathy    Hyperlipidemia due to type 2 diabetes mellitus  [place]   Pulmonary embolism-tachypneic with no apparent dyspnea.  O2 sats 93 to 94% on room air, on 2 L currently.  In the setting of recent COVID positive infection - 01/09/20.  [facility] started on 14-day course of 2.562mBID of Eliquis,  I assume this was because of his COVID infection. - CTA chest showing numerous filling defects right lung, also alveolar hemorrhage right lower lobe. EDP talked to critical care, okay to admit here. - Troponin 324, likely demand ischemia from PE, trend troponin -IV heparin - ECHO - Bilat LE  dopplers -ABG showing PaO2 of  <31, not consistent with patient's O2 sats or respiratory status at this time, will repeat ABG.  Acute metabolic encephalopathy-  Far off from baseline- was able to perform ADLS and lived alone. Head CT without acute abnormality.  At this time doubt infectious etiology. UA- few bacteria, 6-10 WBC, mucus present. Afebrile without leukocytosis. Etiology possibly due to dehydration from poor oral intake. - 1 L bolus, cont N/s 100cc/hr x 20hrs -Hold Celexa for now -Obtain vitamin B12 and folate levels. - F/u TSH  Lactic acidosis- 2.7. Likely 2/2 dehydration.  - Hydrate - Trend  Coronary artery disease-history of stent placement in 2012. troponin 324, likely demand.  EKG without significant changes. - Trend Troponin -Resume aspirin, pravastatin, metoprolol.  Chronic indwelling Foley catheter for urinary retention placed 01/09/20. - F/u with outpt urology. - Will start tamsulosin 0.3m daily  Hypertension- Stable-  -Resume metoprolol.  Diabetes mellitus- 12/23 - A1c 7.3.  Random glucose 135. - SSI- S  Chronic iron deficiency anemia-hemoglobin stable at 10.9.  Hypothyroidism -Follow-up TSH -Resume Synthroid  DVT prophylaxis: Lovenox Code Status: Full code Family Communication: [facility] CFoye Clockon the phone-[phone], all  questions answered, plan of care explained.  Disposition Plan:  ~ 2 days Consults called: None Admission status:  Inpt, Tele I certify that at the point of admission it is my clinical judgment that the patient will require inpatient hospital care spanning beyond 2 midnights from the point of admission due to high intensity of service, high risk for further deterioration and high frequency of surveillance required.    EBethena RoysMD [facility]  02/11/2020, 8:41 PM

## 2020-02-11 NOTE — ED Notes (Signed)
Tried once again to stick patient, patient is confused but also mean at this time . Threatens Rt . Does not know where he is. Falls back asleep.

## 2020-02-11 NOTE — ED Notes (Signed)
CRITICAL VALUE ALERT  Critical Value:  PO2 <31  Date & Time Notied:  02/11/2020 1630  Provider Notified: Dr. Renaye Rakers  Orders Received/Actions taken: see chart

## 2020-02-11 NOTE — ED Notes (Signed)
Date and time results received: 02/11/20 1653 (use smartphrase ".now" to insert current time)  Test: lactic acid Critical Value: 2.7  Name of Provider Notified: A. Tiburcio Pea, PA  Orders Received? Or Actions Taken?: na

## 2020-02-11 NOTE — ED Notes (Addendum)
Date and time results received: 02/11/20 1701 (use smartphrase ".now" to insert current time)  Test: Troponin Critical Value: 324  Name of Provider Notified: Renaye Rakers, MD; Tiburcio Pea, PA  Orders Received? Or Actions Taken?:

## 2020-02-11 NOTE — Progress Notes (Signed)
ANTICOAGULATION CONSULT NOTE - Initial Consult  Pharmacy Consult for heparin gtt  Indication: pulmonary embolus  Allergies  Allergen Reactions  . Hydrocodone     Other reaction(s): Confusion, Hallucinations  . Mobic [Meloxicam] Other (See Comments)    Mucus    Patient Measurements: Height: 6' (182.9 cm) Weight: 88.9 kg (196 lb) IBW/kg (Calculated) : 77.6 Heparin Dosing Weight: HEPARIN DW (KG): 88.9   Vital Signs: Temp: 97.8 F (36.6 C) (01/23 1334) Temp Source: Oral (01/23 1334) BP: 146/72 (01/23 1830) Pulse Rate: 102 (01/23 1800)  Labs: Recent Labs    02/09/20 0430 02/11/20 1542  HGB 10.3* 10.9*  HCT 32.4* 34.2*  PLT 466* 643*  CREATININE 1.20 1.20    Estimated Creatinine Clearance: 50.3 mL/min (by C-G formula based on SCr of 1.2 mg/dL).   Medical History: Past Medical History:  Diagnosis Date  . Arthritis   . Chronic kidney disease   . Constipation    uses stool softener daily  . Foley catheter in place    urinary retention  . GERD (gastroesophageal reflux disease)   . Hypercholesteremia   . Hypertension   . Hypothyroidism   . Myocardial infarction [facility]) 2009   Dr. Caryn Section cardiologist 2 stents    Medications:  (Not in a hospital admission)  Scheduled:  . heparin  4,000 Units Intravenous Once   Infusions:  . heparin     PRN:  Anti-infectives (From admission, onward)   None      Assessment: Ronnie Gregory a 87 y.o. male requires anticoagulation with a heparin iv infusion for the indication of  pulmonary embolus. Heparin gtt will be started following pharmacy protocol per pharmacy consult. Patient is not on previous oral anticoagulant that will require aPTT/HL correlation before transitioning to only HL monitoring.   Goal of Therapy:  Heparin level 0.3-0.7 units/ml Monitor platelets by anticoagulation protocol: Yes   Plan:  Give 4000 units bolus x 1 Start heparin infusion at 1400 units/hr Check anti-Xa level in 8 hours  and daily while on heparin Continue to monitor H&H and platelets  Heparin level to be drawn in 8 hours for patients >47 years old or crcl < 80ml/min  Gerre Pebbles Shavonn Convey 02/11/2020,6:49 PM

## 2020-02-11 NOTE — Progress Notes (Signed)
ABG attempted timed 2 with no success pt to agitated and fighting against me with getting it nurse and techs helped with holding while I tried to get it but patient still jumped and moved making the stick unsuccessful. Nurse aware

## 2020-02-11 NOTE — ED Notes (Signed)
Patient transported to CT 

## 2020-02-11 NOTE — ED Notes (Signed)
Respiratory called for ABG

## 2020-02-11 NOTE — ED Provider Notes (Signed)
[facility] EMERGENCY DEPARTMENT Provider Note   CSN: [id number] Arrival date & time: 02/11/20  1327     History Chief Complaint  Patient presents with  . Failure To Thrive    Ronnie Gregory is a 87 y.o. male with a past medical history of myocardial infarction, chronic kidney disease, esophageal reflux, hypertension, hypercholesterolemia and hypothyroidism.  Patient is hard of hearing.  He presents from the [facility] for evaluation of altered mental status and failure to thrive.  History is given by the patient's daughter and power of attorney, Foye Clock, [phone]) by phone and review of EMR. Patient was recently discharged from this hospital after being admitted with hypoxia, SIRS and metabolic encephalopathy.  Patient also had acute kidney injury with elevated creatinine.  Prior to this hospitalization the patient was admitted to [facility] with COVID-19 infection, and encephalopathy.  He was discharged to a skilled nursing facility where his daughter took him out and had him brought to the emergency department here.  Today the daughter states that he has been extremely lethargic, refusing to eat or drink.  She thinks that the nursing facility is "giving him something that is making him extremely sleepy."  She states that he also had labs drawn recently that showed multiple values out of range which she found very concerning.  She states that normally he has no memory issues and prior to his hospitalization at [facility], he was up cooking and cleaning for himself.  HPI     Past Medical History:  Diagnosis Date  . Arthritis   . Chronic kidney disease   . Constipation    uses stool softener daily  . Foley catheter in place    urinary retention  . GERD (gastroesophageal reflux disease)   . Hypercholesteremia   . Hypertension   . Hypothyroidism   . Myocardial infarction [facility]) 2009   Dr. Cathren Harsh cardiologist 2 stents    Patient Active Problem List    Diagnosis Date Noted  . AKI (acute kidney injury) ([place]) 02/08/2020  . Hypertension associated with diabetes ([place]) 02/07/2020  . Controlled type 2 diabetes mellitus with chronic kidney disease, with long-term current use of insulin ([place]) 02/07/2020  . Stage 3 chronic kidney disease due to diabetes mellitus ([place]) 02/07/2020  . Hyperlipidemia due to type 2 diabetes mellitus ([place]) 02/07/2020  . Vascular dementia without behavioral disturbance ([place]) 02/07/2020  . Depression due to dementia ([place]) 02/07/2020  . Hypothyroidism in adult 02/07/2020  . GERD without esophagitis 02/07/2020  . Cauda equina syndrome ([place]) 02/07/2020  . Chronic constipation 02/07/2020  . Chronic iron deficiency anemia 02/07/2020  . Chronic retention of urine 02/07/2020  . SIRS (systemic inflammatory response syndrome) ([place]) 01/31/2020  . Sepsis secondary to UTI ([place]) 01/31/2020  . H/o COVID-19 virus infection--+ve 01/09/20 , negative 01/25/20 (vaccinated Fall of 2021) 01/31/2020  . Acute metabolic encephalopathy  32/35/5732  . CRI (chronic renal insufficiency), stage 3 (moderate) ([place]) 05/28/2017  . Carotid artery disease ([place]) 05/28/2017  . Spinal stenosis, lumbar region, with neurogenic claudication 10/16/2014  . CAD (coronary artery disease) 12/02/2012  . S/P coronary artery stent placement 12/02/2012    Past Surgical History:  Procedure Laterality Date  . APPENDECTOMY    . BACK SURGERY     several   . CARDIAC CATHETERIZATION    . CORONARY ANGIOPLASTY     3 years ago  . LUMBAR LAMINECTOMY/ DECOMPRESSION WITH MET-RX N/A 03/05/2015   Procedure: Removal of posterior hardware Lumbar one with Metrex;  Surgeon: Kristeen Miss, MD;  Location: [facility] NEURO ORS;  Service: Neurosurgery;  Laterality: N/A;  . SKIN SURGERY     skin cancer removed from forehead       Family History  Problem Relation Age of Onset  . Alzheimer's disease Mother     Social History   Tobacco Use  . Smoking status: Former Smoker     Start date: 01/20/1955    Quit date: 01/20/1960    Years since quitting: 60.1  . Smokeless tobacco: Never Used  Vaping Use  . Vaping Use: Never used  Substance Use Topics  . Alcohol use: No    Alcohol/week: 0.0 standard drinks  . Drug use: No    Home Medications Prior to Admission medications   Medication Sig Start Date End Date Taking? Authorizing Provider  acetaminophen (TYLENOL) 325 MG tablet Take 2 tablets (650 mg total) by mouth every 6 (six) hours as needed for mild pain or headache (or Fever >/= 101). 02/05/20  Yes Emokpae, Courage, MD  Amino Acids-Protein Hydrolys (FEEDING SUPPLEMENT, PRO-STAT SUGAR FREE 64,) LIQD Take 30 mLs by mouth 3 (three) times daily with meals.   Yes [provider]  aspirin EC 81 MG tablet Take 1 tablet (81 mg total) by mouth daily with breakfast. 02/05/20 02/04/21 Yes Emokpae, Courage, MD  citalopram (CELEXA) 20 MG tablet Take 20 mg by mouth daily. 11/01/19  Yes [provider]  ferrous sulfate 325 (65 FE) MG EC tablet Take 1 tablet (325 mg total) by mouth 2 (two) times daily with a meal. 02/05/20  Yes Emokpae, Courage, MD  insulin aspart (NOVOLOG FLEXPEN) 100 UNIT/ML FlexPen Inject 5 Units into the skin 3 (three) times daily with meals. Special instructions: If accu-check is >200 . Hold for accu-check 200 or less   Yes [provider]  levothyroxine (SYNTHROID) 75 MCG tablet Take 1 tablet (75 mcg total) by mouth daily before breakfast. 02/05/20  Yes Emokpae, Courage, MD  magnesium hydroxide (MILK OF MAGNESIA) 400 MG/5ML suspension Take 30 mLs by mouth once.   Yes [provider]  metoprolol tartrate (LOPRESSOR) 25 MG tablet Take 1 tablet (25 mg total) by mouth 2 (two) times daily. 02/05/20  Yes Emokpae, Courage, MD  ondansetron (ZOFRAN) 4 MG tablet Take 1 tablet (4 mg total) by mouth every 8 (eight) hours as needed for nausea. 02/05/20  Yes Emokpae, Courage, MD  pantoprazole (PROTONIX) 40 MG tablet Take 1 tablet (40 mg total) by  mouth daily. 02/06/20  Yes Emokpae, Courage, MD  polyethylene glycol (MIRALAX / GLYCOLAX) 17 g packet Take 17 g by mouth daily. 02/05/20  Yes Emokpae, Courage, MD  pravastatin (PRAVACHOL) 40 MG tablet Take 1 tablet (40 mg total) by mouth at bedtime. 02/05/20  Yes Emokpae, Courage, MD  tiZANidine (ZANAFLEX) 2 MG tablet Take 1 tablet (2 mg total) by mouth every 12 (twelve) hours as needed for muscle spasms (back pain). 02/05/20  Yes Roxan Hockey, MD  UNABLE TO FIND Diet: NAS & Consistent Carbohydrate    [provider]    Allergies    Hydrocodone and Mobic [meloxicam]  Review of Systems   Review of Systems  Unable to perform ROS: Mental status change    Physical Exam Updated Vital Signs BP 118/83   Pulse 95   Temp 97.8 F (36.6 C) (Oral)   Resp (!) 28   Ht 6' (1.829 m)   Wt 88.9 kg   SpO2 96%   BMI 26.58 kg/m   Physical Exam Vitals  and nursing note reviewed.  Constitutional:      General: He is sleeping. He is not in acute distress.    Appearance: He is well-developed and well-nourished. He is not diaphoretic.  HENT:     Head: Normocephalic and atraumatic.  Eyes:     General: No scleral icterus.    Conjunctiva/sclera: Conjunctivae normal.  Cardiovascular:     Rate and Rhythm: Normal rate and regular rhythm.     Heart sounds: Normal heart sounds.  Pulmonary:     Effort: Pulmonary effort is normal. No respiratory distress.     Breath sounds: Normal breath sounds.  Abdominal:     Palpations: Abdomen is soft.     Tenderness: There is no abdominal tenderness.  Musculoskeletal:        General: No edema.     Cervical back: Normal range of motion and neck supple.  Skin:    General: Skin is warm and dry.  Neurological:     Mental Status: He is lethargic and disoriented.     GCS: GCS eye subscore is 2. GCS verbal subscore is 4. GCS motor subscore is 6.  Psychiatric:        Behavior: Behavior normal.     ED Results / Procedures / Treatments   Labs (all labs  ordered are listed, but only abnormal results are displayed) Labs Reviewed  CBC WITH DIFFERENTIAL/PLATELET - Abnormal; Notable for the following components:      Result Value   RBC 3.46 (*)    Hemoglobin 10.9 (*)    HCT 34.2 (*)    Platelets 643 (*)    Abs Immature Granulocytes 0.21 (*)    All other components within normal limits  COMPREHENSIVE METABOLIC PANEL - Abnormal; Notable for the following components:   Sodium 133 (*)    Glucose, Bld 135 (*)    Calcium 8.8 (*)    Albumin 2.5 (*)    GFR, Estimated 60 (*)    All other components within normal limits  URINALYSIS, ROUTINE W REFLEX MICROSCOPIC - Abnormal; Notable for the following components:   APPearance HAZY (*)    Glucose, UA 50 (*)    Ketones, ur 5 (*)    Protein, ur 30 (*)    Bacteria, UA FEW (*)    All other components within normal limits  LACTIC ACID, PLASMA - Abnormal; Notable for the following components:   Lactic Acid, Venous 2.7 (*)    All other components within normal limits  BLOOD GAS, ARTERIAL - Abnormal; Notable for the following components:   pO2, Arterial <31.00 (*)    All other components within normal limits  TROPONIN I (HIGH SENSITIVITY) - Abnormal; Notable for the following components:   Troponin I (High Sensitivity) 324 (*)    All other components within normal limits  CULTURE, BLOOD (ROUTINE X 2)  CULTURE, BLOOD (ROUTINE X 2)  AMMONIA  RAPID URINE DRUG SCREEN, HOSP PERFORMED  TSH  T4, FREE  CBG MONITORING, ED  TROPONIN I (HIGH SENSITIVITY)    EKG EKG Interpretation  Date/Time:  Sunday February 11 2020 17:12:49 EST Ventricular Rate:  94 PR Interval:    QRS Duration: 102 QT Interval:  365 QTC Calculation: 457 R Axis:   13 Text Interpretation: Sinus rhythm Abnormal R-wave progression, early transition Nonspecific repol abnormality, lateral leads No STEMI Confirmed by Octaviano Glow [phone]) on 02/11/2020 5:26:03 PM   Radiology DG Chest 2 View  Result Date: 02/11/2020 CLINICAL DATA:   Altered mental status. EXAM: CHEST -  2 VIEW COMPARISON:  Chest radiograph 06/30/2020. FINDINGS: Stable cardiac and mediastinal contours. Low lung volumes. Bibasilar atelectasis. No large area pulmonary consolidation. No pleural effusion or pneumothorax. IMPRESSION: No active cardiopulmonary disease. Electronically Signed   By: Lovey Newcomer M.D.   On: 02/11/2020 15:01   CT Head Wo Contrast  Result Date: 02/11/2020 CLINICAL DATA:  Altered level of consciousness, decreased appetite, lethargy EXAM: CT HEAD WITHOUT CONTRAST TECHNIQUE: Contiguous axial images were obtained from the base of the skull through the vertex without intravenous contrast. COMPARISON:  01/31/2020 FINDINGS: Brain: Stable confluent hypodensities throughout the periventricular and subcortical white matter consistent with chronic small vessel ischemic change. No sign of acute infarct or hemorrhage. Lateral ventricles and midline structures are stable. No acute extra-axial fluid collections. No mass effect. Vascular: No hyperdense vessel or unexpected calcification. Skull: Normal. Negative for fracture or focal lesion. Sinuses/Orbits: No acute finding. Other: None. IMPRESSION: 1. Stable chronic small vessel ischemic changes. No acute intracranial process. Electronically Signed   By: Randa Ngo M.D.   On: 02/11/2020 14:56    Procedures .Critical Care Performed by: Margarita Mail, PA-C Authorized by: Margarita Mail, PA-C   Critical care provider statement:    Critical care time (minutes):  55   Critical care time was exclusive of:  Separately billable procedures and treating other patients   Critical care was necessary to treat or prevent imminent or life-threatening deterioration of the following conditions:  Respiratory failure   Critical care was time spent personally by me on the following activities:  Discussions with consultants, evaluation of patient's response to treatment, examination of patient, ordering and performing  treatments and interventions, ordering and review of laboratory studies, ordering and review of radiographic studies, pulse oximetry, re-evaluation of patient's condition, obtaining history from patient or surrogate and review of old charts   (including critical care time)  Medications Ordered in ED Medications  0.9 %  sodium chloride infusion (1,000 mLs Intravenous New Bag/Given 02/11/20 1537)    ED Course  I have reviewed the triage vital signs and the nursing notes.  Pertinent labs & imaging results that were available during my care of the patient were reviewed by me and considered in my medical decision making (see chart for details).  Clinical Course as of 02/11/20 2155  Melvyn Neth 87 yo male presenting from [facility] after recent admission for Covid in December 2021 and again at our hospital for encephalopathy in Jan 2022 - d/c to SNF on 02/05/20.  Daughter reports he isn't eating or drinking and is lethargic and confused.  Wanted him back in the hospital for evaluation.  Pt today appears lethargic, hard of hearing, has chronic catheter, and tachypneic.  UA negative for leuks and nitrites.  CTH negative for acute findings. Labs penidng [MT]  4098 Creatinine: 1.20 [AH]  1191 VBG with pO2 reportedly < 31.  pH normal.  Bicarb normal.  I strongly suspect this was an incidental venous stick, not arterial.  He is on 2L [place].  Trop elevated here - repeat ECG shows NSR without STEMI.  We'll obtain a CT PE study and he'll need a repeat troponin [MT]  1807 Paged critical care regarding large Pe's.  With question of alveolar hemorrhage I would wait to initiate heparin until discussing this with them.  BP remains stable, pt on 2L [place] [MT]  1816 I spoke to Dr. Ander Slade from [place], discussed the case, he reported if the patient is hemodynamically stable here  he can be managed on IV heparin at [facility], and would need an echocardiogram tomorrow.  If the patient decompensates, the  hospitalist should contact him regarding need for transfer.  Dr. Ander Slade reports it is okay to initiate heparin even with noted alveolar hemorrhage. [MT]  1901 Daughter updated [MT]    Clinical Course User Index [AH] Margarita Mail, PA-C [MT] Wyvonnia Dusky, MD   MDM Rules/Calculators/A&P                          CC:ams weakness VS:  Vitals:   02/11/20 2112 02/11/20 2115 02/11/20 2125 02/11/20 2126  BP:    132/70  Pulse: 88 85 100 (!) 101  Resp: (!) 28 (!) 26    Temp:      TempSrc:      SpO2: 95% 93% 94% 95%  Weight:      Height:        UX:YBFXOVA is gathered by daughter by phone and emr. Previous records obtained and reviewed. DDX:The patient's complaint of AMS involves an extensive number of diagnostic and treatment options, and is a complaint that carries with it a high risk of complications, morbidity, and potential mortality. Given the large differential diagnosis, medical decision making is of high complexity. The differential diagnosis for AMS is extensive and includes, but is not limited to: drug overdose - opioids, alcohol, sedatives, antipsychotics, drug withdrawal, others; Metabolic: hypoxia, hypoglycemia, hyperglycemia, hypercalcemia, hypernatremia, hyponatremia, uremia, hepatic encephalopathy, hypothyroidism, hyperthyroidism, vitamin B12 or thiamine deficiency, carbon monoxide poisoning, Wilson's disease, Lactic acidosis, DKA/HHOS; Infectious: meningitis, encephalitis, bacteremia/sepsis, urinary tract infection, pneumonia, neurosyphilis; Structural: Space-occupying lesion, (brain tumor, subdural hematoma, hydrocephalus,); Vascular: stroke, subarachnoid hemorrhage, coronary ischemia, hypertensive encephalopathy, CNS vasculitis, thrombotic thrombocytopenic purpura, disseminated intravascular coagulation, hyperviscosity; Psychiatric: Schizophrenia, depression; Other: Seizure, hypothermia, heat stroke, ICU psychosis, dementia -"sundowning."   Labs: I ordered reviewed and  interpreted labs which include CBC which shows mild anemia of insignificant value, CMP with slightly elevated blood glucose.  Ammonia within normal limits.  Patient's lactic acid and troponin elevated. UDS negative, urine without evidence of infection  Imaging: I ordered and reviewed images which included CT head, 2 view chest x-ray and CT angiogram of the chest. I independently visualized and interpreted all imaging. Significant findings include large right-sided PE and segmental PE with suspected alveolar hemorrhage due to pulmonary infarct.. There are no acute, significant findings on the remainder of today's images. EKG: Tachycardia at a rate of 102 Consults: Dr. Denton Brick I for admission MDM: 87 year old male here with altered mental status, weakness, tachypnea.  He has had multiple evaluations and recent COVID infection.  He is presenting from SNF.  Patient had an elevated troponin level suspicion for pulmonary embolus as the patient has had fluctuating oxygen saturations requiring 2 L via nasal cannula.  CT angiogram was positive for pulmonary embolus.  Patient started on heparin after consult with critical care.  Patient admitted to hospitalist.  Daughter updated on his status. Patient disposition:The patient appears reasonably stabilized for admission considering the current resources, flow, and capabilities available in the ED at this time, and I doubt any other [facility] requiring further screening and/or treatment in the ED prior to admission.        Final Clinical Impression(s) / ED Diagnoses Final diagnoses:  Pulmonary embolus and infarction [facility])    Rx / DC Orders ED Discharge Orders    None       Margarita Mail, PA-C 02/11/20 2203  Wyvonnia Dusky, MD 02/15/20 [phone]

## 2020-02-12 ENCOUNTER — Other Ambulatory Visit (HOSPITAL_COMMUNITY): Payer: Medicare Other

## 2020-02-12 ENCOUNTER — Inpatient Hospital Stay (HOSPITAL_COMMUNITY): Payer: Medicare Other

## 2020-02-12 DIAGNOSIS — Z955 Presence of coronary angioplasty implant and graft: Secondary | ICD-10-CM

## 2020-02-12 DIAGNOSIS — E1169 Type 2 diabetes mellitus with other specified complication: Secondary | ICD-10-CM

## 2020-02-12 DIAGNOSIS — Z7189 Other specified counseling: Secondary | ICD-10-CM

## 2020-02-12 DIAGNOSIS — Z515 Encounter for palliative care: Secondary | ICD-10-CM

## 2020-02-12 DIAGNOSIS — F0150 Vascular dementia without behavioral disturbance: Secondary | ICD-10-CM

## 2020-02-12 DIAGNOSIS — E785 Hyperlipidemia, unspecified: Secondary | ICD-10-CM

## 2020-02-12 LAB — GLUCOSE, CAPILLARY: Glucose-Capillary: 89 mg/dL (ref 70–99)

## 2020-02-12 LAB — CBC
HCT: 27.8 % — ABNORMAL LOW (ref 39.0–52.0)
Hemoglobin: 8.6 g/dL — ABNORMAL LOW (ref 13.0–17.0)
MCH: 30.6 pg (ref 26.0–34.0)
MCHC: 30.9 g/dL (ref 30.0–36.0)
MCV: 98.9 fL (ref 80.0–100.0)
Platelets: 480 10*3/uL — ABNORMAL HIGH (ref 150–400)
RBC: 2.81 MIL/uL — ABNORMAL LOW (ref 4.22–5.81)
RDW: 13.3 % (ref 11.5–15.5)
WBC: 6.8 10*3/uL (ref 4.0–10.5)
nRBC: 0 % (ref 0.0–0.2)

## 2020-02-12 LAB — CBG MONITORING, ED
Glucose-Capillary: 100 mg/dL — ABNORMAL HIGH (ref 70–99)
Glucose-Capillary: 108 mg/dL — ABNORMAL HIGH (ref 70–99)
Glucose-Capillary: 90 mg/dL (ref 70–99)

## 2020-02-12 LAB — TROPONIN I (HIGH SENSITIVITY): Troponin I (High Sensitivity): 285 ng/L (ref <18)

## 2020-02-12 LAB — SARS CORONAVIRUS 2 (TAT 6-24 HRS): SARS Coronavirus 2: NEGATIVE

## 2020-02-12 LAB — MRSA PCR SCREENING: MRSA by PCR: NEGATIVE

## 2020-02-12 LAB — HEPARIN LEVEL (UNFRACTIONATED): Heparin Unfractionated: 0.84 IU/mL — ABNORMAL HIGH (ref 0.30–0.70)

## 2020-02-12 MED ORDER — SODIUM CHLORIDE 0.9 % IV SOLN: INTRAVENOUS | Status: DC

## 2020-02-12 MED ORDER — ENOXAPARIN SODIUM 100 MG/ML ~~LOC~~ SOLN
90.0000 mg | Freq: Two times a day (BID) | SUBCUTANEOUS | Status: DC
  Administered 2020-02-13 – 2020-02-14 (×3): 90 mg via SUBCUTANEOUS
  Filled 2020-02-12 (×4): qty 1

## 2020-02-12 MED ORDER — ENOXAPARIN SODIUM 100 MG/ML ~~LOC~~ SOLN
90.0000 mg | Freq: Once | SUBCUTANEOUS | Status: AC
  Administered 2020-02-12: 90 mg via SUBCUTANEOUS
  Filled 2020-02-12: qty 1

## 2020-02-12 MED ORDER — CHLORHEXIDINE GLUCONATE CLOTH 2 % EX PADS
6.0000 | MEDICATED_PAD | Freq: Every day | CUTANEOUS | Status: DC
  Administered 2020-02-13 – 2020-02-19 (×7): 6 via TOPICAL

## 2020-02-12 NOTE — ED Notes (Signed)
ED TO INPATIENT HANDOFF REPORT  ED Nurse Name and Phone #: [phone]  S Name/Age/Gender Ronnie Gregory 87 y.o. male Room/Bed: APA02/APA02  Code Status   Code Status: Full Code  Home/SNF/Other Nursing Home Patient oriented to: self Is this baseline? Yes   Triage Complete: Triage complete  Chief Complaint Pulmonary embolus [facility]) [I26.99]  Triage Note Report [facility].Pt with recent hospitalization with sepsis. Pt with on going poor appetite and altered mental status  daughter wanted pt to be evaluated. Pt lethargic. Oriented to person and place only    Allergies Allergies  Allergen Reactions  . Hydrocodone     Other reaction(s): Confusion, Hallucinations  . Mobic [Meloxicam] Other (See Comments)    Mucus    Level of Care/Admitting Diagnosis ED Disposition    ED Disposition Condition [facility] Area: [facility] [[id number]]  Level of Care: Telemetry [5]  Covid Evaluation: Recent COVID positive no isolation required infection day 21-90  Diagnosis: Pulmonary embolus [facility]) [[id number]]  Admitting Physician: Kara Pacer  Attending Physician: Bethena Roys [phone]  Estimated length of stay: past midnight tomorrow  Certification:: I certify this patient will need inpatient services for at least 2 midnights       B Medical/Surgery History Past Medical History:  Diagnosis Date  . Arthritis   . Chronic kidney disease   . Constipation    uses stool softener daily  . Foley catheter in place    urinary retention  . GERD (gastroesophageal reflux disease)   . Hypercholesteremia   . Hypertension   . Hypothyroidism   . Myocardial infarction [facility]) 2009   Dr. Cathren Harsh cardiologist 2 stents   Past Surgical History:  Procedure Laterality Date  . APPENDECTOMY    . BACK SURGERY     several   . CARDIAC CATHETERIZATION    . CORONARY ANGIOPLASTY     3 years ago  . LUMBAR LAMINECTOMY/ DECOMPRESSION WITH  MET-RX N/A 03/05/2015   Procedure: Removal of posterior hardware Lumbar one with Metrex;  Surgeon: Kristeen Miss, MD;  Location: [place] [facility];  Service: Neurosurgery;  Laterality: N/A;  . SKIN SURGERY     skin cancer removed from forehead     A IV Location/Drains/Wounds Patient Lines/Drains/Airways Status    Active Line/Drains/Airways    Name Placement date Placement time Site Days   Peripheral IV 02/11/20 Right Antecubital 02/11/20  1536  Antecubital  1   Peripheral IV 02/12/20 Right;Upper Arm 02/12/20  0339  Arm  less than 1   Urethral Catheter Birdie Riddle RN Non-latex 16 Fr. 02/02/20  1258  Non-latex  10          Intake/Output Last 24 hours  Intake/Output Summary (Last 24 hours) at 02/12/2020 1638 Last data filed at 02/12/2020 1411 Gross per 24 hour  Intake 2440.65 ml  Output 325 ml  Net 2115.65 ml    Labs/Imaging Results for orders placed or performed during the hospital encounter of 02/11/20 (from the past 48 hour(s))  Urinalysis, Routine w reflex microscopic     Status: Abnormal   Collection Time: 02/11/20  2:08 PM  Result Value Ref Range   Color, Urine YELLOW YELLOW   APPearance HAZY (A) CLEAR   Specific Gravity, Urine 1.026 1.005 - 1.030   pH 5.0 5.0 - 8.0   Glucose, UA 50 (A) NEGATIVE mg/dL   Hgb urine dipstick NEGATIVE NEGATIVE   Bilirubin Urine NEGATIVE NEGATIVE   Ketones, ur 5 (A)  NEGATIVE mg/dL   Protein, ur 30 (A) NEGATIVE mg/dL   Nitrite NEGATIVE NEGATIVE   Leukocytes,Ua NEGATIVE NEGATIVE   RBC / HPF 6-10 0 - 5 RBC/hpf   WBC, UA 6-10 0 - 5 WBC/hpf   Bacteria, UA FEW (A) NONE SEEN   Mucus PRESENT     Comment: Performed at [facility], [address], [place], [place] [address]  Rapid urine drug screen (hospital performed)     Status: None   Collection Time: 02/11/20  3:01 PM  Result Value Ref Range   Opiates NONE DETECTED NONE DETECTED   Cocaine NONE DETECTED NONE DETECTED   Benzodiazepines NONE DETECTED NONE DETECTED   Amphetamines NONE DETECTED  NONE DETECTED   Tetrahydrocannabinol NONE DETECTED NONE DETECTED   Barbiturates NONE DETECTED NONE DETECTED    Comment: (NOTE) DRUG SCREEN FOR MEDICAL PURPOSES ONLY.  IF CONFIRMATION IS NEEDED FOR ANY PURPOSE, NOTIFY LAB WITHIN 5 DAYS.  LOWEST DETECTABLE LIMITS FOR URINE DRUG SCREEN Drug Class                     Cutoff (ng/mL) Amphetamine and metabolites    1000 Barbiturate and metabolites    200 Benzodiazepine                 431 Tricyclics and metabolites     300 Opiates and metabolites        300 Cocaine and metabolites        300 THC                            50 Performed at [facility], [address], [place], [place] [address]   CBC with Differential     Status: Abnormal   Collection Time: 02/11/20  3:42 PM  Result Value Ref Range   WBC 9.4 4.0 - 10.5 K/uL   RBC 3.46 (L) 4.22 - 5.81 MIL/uL   Hemoglobin 10.9 (L) 13.0 - 17.0 g/dL   HCT 34.2 (L) 39.0 - 52.0 %   MCV 98.8 80.0 - 100.0 fL   MCH 31.5 26.0 - 34.0 pg   MCHC 31.9 30.0 - 36.0 g/dL   RDW 13.2 11.5 - 15.5 %   Platelets 643 (H) 150 - 400 K/uL   nRBC 0.0 0.0 - 0.2 %   Neutrophils Relative % 69 %   Neutro Abs 6.5 1.7 - 7.7 K/uL   Lymphocytes Relative 17 %   Lymphs Abs 1.6 0.7 - 4.0 K/uL   Monocytes Relative 9 %   Monocytes Absolute 0.9 0.1 - 1.0 K/uL   Eosinophils Relative 2 %   Eosinophils Absolute 0.2 0.0 - 0.5 K/uL   Basophils Relative 1 %   Basophils Absolute 0.1 0.0 - 0.1 K/uL   Immature Granulocytes 2 %   Abs Immature Granulocytes 0.21 (H) 0.00 - 0.07 K/uL    Comment: Performed at [facility], [address], [place], [place] [address]  Comprehensive metabolic panel     Status: Abnormal   Collection Time: 02/11/20  3:42 PM  Result Value Ref Range   Sodium 133 (L) 135 - 145 mmol/L   Potassium 3.6 3.5 - 5.1 mmol/L   Chloride 99 98 - 111 mmol/L   CO2 23 22 - 32 mmol/L   Glucose, Bld 135 (H) 70 - 99 mg/dL    Comment: Glucose reference range applies only to samples taken after fasting for at least 8  hours.   BUN 21  8 - 23 mg/dL   Creatinine, Ser 1.20 0.61 - 1.24 mg/dL   Calcium 8.8 (L) 8.9 - 10.3 mg/dL   Total Protein 6.5 6.5 - 8.1 g/dL   Albumin 2.5 (L) 3.5 - 5.0 g/dL   AST 29 15 - 41 U/L   ALT 37 0 - 44 U/L   Alkaline Phosphatase 110 38 - 126 U/L   Total Bilirubin 0.4 0.3 - 1.2 mg/dL   GFR, Estimated 60 (L) >60 mL/min    Comment: (NOTE) Calculated using the CKD-EPI Creatinine Equation (2021)    Anion gap 11 5 - 15    Comment: Performed at [facility], [address], [place], [place] [address]  Lactic acid, plasma     Status: Abnormal   Collection Time: 02/11/20  3:42 PM  Result Value Ref Range   Lactic Acid, Venous 2.7 (HH) 0.5 - 1.9 mmol/L    Comment: CRITICAL RESULT CALLED TO, READ BACK BY AND VERIFIED WITH: BRANDY BANES,RN@1651  02/11/2020 KAY Performed at [facility], [address], [place], [place] [address]   Ammonia     Status: None   Collection Time: 02/11/20  3:42 PM  Result Value Ref Range   Ammonia 16 9 - 35 umol/L    Comment: Performed at [facility], [address] [place], [place] [address]  Blood culture (routine x 2)     Status: None (Preliminary result)   Collection Time: 02/11/20  3:42 PM   Specimen: Right Antecubital; Blood  Result Value Ref Range   Specimen Description      RIGHT ANTECUBITAL BOTTLES DRAWN AEROBIC AND ANAEROBIC   Special Requests Blood Culture adequate volume    Culture      NO GROWTH < 24 HOURS Performed at [facility], [address] [place], [place] [address]    Report Status PENDING   Troponin I (High Sensitivity)     Status: Abnormal   Collection Time: 02/11/20  3:43 PM  Result Value Ref Range   Troponin I (High Sensitivity) 324 (HH) <18 ng/L    Comment: CRITICAL RESULT CALLED TO, READ BACK BY AND VERIFIED WITH: TIFFANY EASTER,RN @1659  02/11/2020 KAY (NOTE) Elevated high sensitivity troponin I (hsTnI) values and significant  changes across serial measurements may suggest ACS but many other  chronic and acute  conditions are known to elevate hsTnI results.  Refer to the Links section for chest pain algorithms and additional  guidance. Performed at [facility], [address] [place], [place] [address]   TSH     Status: Abnormal   Collection Time: 02/11/20  3:43 PM  Result Value Ref Range   TSH 6.386 (H) 0.350 - 4.500 uIU/mL    Comment: Performed by a 3rd Generation assay with a functional sensitivity of <=0.01 uIU/mL. Performed at [facility], [place] [address], [place], [place] [address]   T4, free     Status: Abnormal   Collection Time: 02/11/20  3:43 PM  Result Value Ref Range   Free T4 1.15 (H) 0.61 - 1.12 ng/dL    Comment: (NOTE) Biotin ingestion may interfere with free T4 tests. If the results are inconsistent with the TSH level, previous test results, or the clinical presentation, then consider biotin interference. If needed, order repeat testing after stopping biotin. Performed at [facility], [place] [address] [place],  [address]   Blood culture (routine x 2)     Status: None (Preliminary result)   Collection Time: 02/11/20  3:46 PM   Specimen:  BLOOD RIGHT HAND  Result Value Ref Range   Specimen Description BLOOD RIGHT HAND BOTTLES DRAWN AEROBIC ONLY    Special Requests      Blood Culture results may not be optimal due to an inadequate volume of blood received in culture bottles   Culture      NO GROWTH < 24 HOURS Performed at [facility], [address], [place], [place] [address]    Report Status PENDING   Blood gas, arterial     Status: Abnormal   Collection Time: 02/11/20  4:21 PM  Result Value Ref Range   FIO2 21.00    pH, Arterial 7.393 7.350 - 7.450   pCO2 arterial 42.6 32.0 - 48.0 mmHg   pO2, Arterial <31.00 (LL) 83.0 - 108.0 mmHg    Comment: CRITICAL RESULT CALLED TO, READ BACK BY AND VERIFIED WITH: DOSS M @ 1630 ON [id number] BY HENDERSON L    Bicarbonate 23.8 20.0 - 28.0 mmol/L   Acid-Base Excess 1.1 0.0 - 2.0 mmol/L   O2 Saturation 11.6 %    Patient temperature 36.6    Allens test (pass/fail) PASS PASS    Comment: Performed at [facility], [address], [place], [place] [address]  Troponin I (High Sensitivity)     Status: Abnormal   Collection Time: 02/11/20 11:08 PM  Result Value Ref Range   Troponin I (High Sensitivity) 285 (HH) <18 ng/L    Comment: CRITICAL VALUE NOTED.  VALUE IS CONSISTENT WITH PREVIOUSLY REPORTED AND CALLED VALUE. (NOTE) Elevated high sensitivity troponin I (hsTnI) values and significant  changes across serial measurements may suggest ACS but many other  chronic and acute conditions are known to elevate hsTnI results.  Refer to the Links section for chest pain algorithms and additional  guidance. Performed at [facility], [address] [place], [place] [address]   Lactic acid, plasma     Status: None   Collection Time: 02/11/20 11:08 PM  Result Value Ref Range   Lactic Acid, Venous 1.5 0.5 - 1.9 mmol/L    Comment: Performed at [facility], [address] [place], [place] [address]  Blood gas, arterial     Status: Abnormal   Collection Time: 02/11/20 11:18 PM  Result Value Ref Range   FIO2 24.00    pH, Arterial 7.461 (H) 7.350 - 7.450   pCO2 arterial 30.1 (L) 32.0 - 48.0 mmHg   pO2, Arterial 80.8 (L) 83.0 - 108.0 mmHg   Bicarbonate 23.0 20.0 - 28.0 mmol/L   Acid-base deficit 2.1 (H) 0.0 - 2.0 mmol/L   O2 Saturation 96.2 %   Patient temperature 36.9    Allens test (pass/fail) PASS PASS    Comment: Performed at [facility], [address] [place], [place] [address]  Heparin level (unfractionated)     Status: Abnormal   Collection Time: 02/12/20  6:32 AM  Result Value Ref Range   Heparin Unfractionated 0.84 (H) 0.30 - 0.70 IU/mL    Comment: (NOTE) If heparin results are below expected values, and patient dosage has  been confirmed, suggest follow up testing of antithrombin III levels. Performed at [facility], [address] [place], [place] [address]   CBC     Status: Abnormal    Collection Time: 02/12/20  6:50 AM  Result Value Ref Range   WBC 6.8 4.0 - 10.5 K/uL   RBC 2.81 (L) 4.22 - 5.81 MIL/uL   Hemoglobin 8.6 (L) 13.0 - 17.0 g/dL   HCT 27.8 (L) 39.0 - 52.0 %  MCV 98.9 80.0 - 100.0 fL   MCH 30.6 26.0 - 34.0 pg   MCHC 30.9 30.0 - 36.0 g/dL   RDW 13.3 11.5 - 15.5 %   Platelets 480 (H) 150 - 400 K/uL   nRBC 0.0 0.0 - 0.2 %    Comment: Performed at [facility], [address], [place], [place] [address]  CBG monitoring, ED     Status: Abnormal   Collection Time: 02/12/20  9:05 AM  Result Value Ref Range   Glucose-Capillary 100 (H) 70 - 99 mg/dL    Comment: Glucose reference range applies only to samples taken after fasting for at least 8 hours.  CBG monitoring, ED     Status: Abnormal   Collection Time: 02/12/20 11:33 AM  Result Value Ref Range   Glucose-Capillary 108 (H) 70 - 99 mg/dL    Comment: Glucose reference range applies only to samples taken after fasting for at least 8 hours.   DG Chest 2 View  Result Date: 02/11/2020 CLINICAL DATA:  Altered mental status. EXAM: CHEST - 2 VIEW COMPARISON:  Chest radiograph 06/30/2020. FINDINGS: Stable cardiac and mediastinal contours. Low lung volumes. Bibasilar atelectasis. No large area pulmonary consolidation. No pleural effusion or pneumothorax. IMPRESSION: No active cardiopulmonary disease. Electronically Signed   By: Lovey Newcomer M.D.   On: 02/11/2020 15:01   CT Head Wo Contrast  Result Date: 02/11/2020 CLINICAL DATA:  Altered level of consciousness, decreased appetite, lethargy EXAM: CT HEAD WITHOUT CONTRAST TECHNIQUE: Contiguous axial images were obtained from the base of the skull through the vertex without intravenous contrast. COMPARISON:  01/31/2020 FINDINGS: Brain: Stable confluent hypodensities throughout the periventricular and subcortical white matter consistent with chronic small vessel ischemic change. No sign of acute infarct or hemorrhage. Lateral ventricles and midline structures are stable. No  acute extra-axial fluid collections. No mass effect. Vascular: No hyperdense vessel or unexpected calcification. Skull: Normal. Negative for fracture or focal lesion. Sinuses/Orbits: No acute finding. Other: None. IMPRESSION: 1. Stable chronic small vessel ischemic changes. No acute intracranial process. Electronically Signed   By: Randa Ngo M.D.   On: 02/11/2020 14:56   CT ANGIO CHEST PE W OR WO CONTRAST  Result Date: 02/11/2020 CLINICAL DATA:  87 year old male with history of lethargy. Evaluate for pulmonary embolism. EXAM: CT ANGIOGRAPHY CHEST WITH CONTRAST TECHNIQUE: Multidetector CT imaging of the chest was performed using the standard protocol during bolus administration of intravenous contrast. Multiplanar CT image reconstructions and MIPs were obtained to evaluate the vascular anatomy. CONTRAST:  73m OMNIPAQUE IOHEXOL 350 MG/ML SOLN COMPARISON:  No priors. FINDINGS: Cardiovascular: There are numerous filling defects within the distal right main, lobar, segmental and subsegmental sized branches of the pulmonary arteries to the right lung, indicative of pulmonary embolism. No definite left-sided filling defects are noted. Heart size is normal. There is no significant pericardial fluid, thickening or pericardial calcification. There is aortic atherosclerosis, as well as atherosclerosis of the great vessels of the mediastinum and the coronary arteries, including calcified atherosclerotic plaque in the left anterior descending and right coronary arteries. Mediastinum/Nodes: No pathologically enlarged mediastinal or hilar lymph nodes. Please note that accurate exclusion of hilar adenopathy is limited on noncontrast CT scans. Esophagus is unremarkable in appearance. No axillary lymphadenopathy. Lungs/Pleura: There is a small amount of dependent airspace consolidation in the right lower lobe. Lungs are otherwise clear. No suspicious appearing pulmonary nodules or masses are noted. No pleural effusions.  Upper Abdomen: Aortic atherosclerosis. Musculoskeletal: There are no aggressive appearing lytic or blastic  lesions noted in the visualized portions of the skeleton. Review of the MIP images confirms the above findings. IMPRESSION: 1. Study is positive for pulmonary embolism in the right lung. In addition, there is some airspace consolidation in the right lower lobe which may represent alveolar hemorrhage from pulmonary infarction. 2. Aortic atherosclerosis, in addition to 2 vessel coronary artery disease. Please note that although the presence of coronary artery calcium documents the presence of coronary artery disease, the severity of this disease and any potential stenosis cannot be assessed on this non-gated CT examination. Assessment for potential risk factor modification, dietary therapy or pharmacologic therapy may be warranted, if clinically indicated. Critical Value/emergent results were called by telephone at the time of interpretation on 02/11/2020 at 5:50 pm to provider ABIGAIL HARRIS , who verbally acknowledged these results. Aortic Atherosclerosis (ICD10-I70.0). Electronically Signed   By: Vinnie Langton M.D.   On: 02/11/2020 17:51   US Venous Img Lower Bilateral (DVT)  Result Date: 02/12/2020 CLINICAL DATA:  History of pulmonary emboli EXAM: BILATERAL LOWER EXTREMITY VENOUS DOPPLER ULTRASOUND TECHNIQUE: Gray-scale sonography with graded compression, as well as color Doppler and duplex ultrasound were performed to evaluate the lower extremity deep venous systems from the level of the common femoral vein and including the common femoral, femoral, profunda femoral, popliteal and calf veins including the posterior tibial, peroneal and gastrocnemius veins when visible. The superficial great saphenous vein was also interrogated. Spectral Doppler was utilized to evaluate flow at rest and with distal augmentation maneuvers in the common femoral, femoral and popliteal veins. The left lower leg was not  evaluated due to the combative nature of the patient. COMPARISON:  None. FINDINGS: RIGHT LOWER EXTREMITY Common Femoral Vein: No evidence of thrombus. Normal compressibility, respiratory phasicity and response to augmentation. Saphenofemoral Junction: No evidence of thrombus. Normal compressibility and flow on color Doppler imaging. Profunda Femoral Vein: No evidence of thrombus. Normal compressibility and flow on color Doppler imaging. Femoral Vein: No evidence of thrombus. Normal compressibility, respiratory phasicity and response to augmentation. Popliteal Vein: No evidence of thrombus. Normal compressibility, respiratory phasicity and response to augmentation. Calf Veins: No evidence of thrombus. Normal compressibility and flow on color Doppler imaging. Superficial Great Saphenous Vein: No evidence of thrombus. Normal compressibility. Venous Reflux:  None. Other Findings:  None. LEFT LOWER EXTREMITY Common Femoral Vein: No evidence of thrombus. Normal compressibility, respiratory phasicity and response to augmentation. Saphenofemoral Junction: No evidence of thrombus. Normal compressibility and flow on color Doppler imaging. Profunda Femoral Vein: No evidence of thrombus. Normal compressibility and flow on color Doppler imaging. Femoral Vein: No evidence of thrombus. Normal compressibility, respiratory phasicity and response to augmentation. Popliteal Vein: Not evaluated Calf Veins: Not evaluate Superficial Great Saphenous Vein: Not evaluate Venous Reflux:  None. Other Findings:  None. IMPRESSION: No evidence of deep venous thrombosis is noted. The left lower leg was incompletely evaluated due to the patient's combative nature. Electronically Signed   By: Inez Catalina M.D.   On: 02/12/2020 10:08    Pending Labs Unresulted Labs (From admission, onward)          Start     Ordered   02/12/20 1116  SARS CORONAVIRUS 2 (TAT 6-24 HRS) Nasopharyngeal Nasopharyngeal Swab  (Tier 3 - Symptomatic/asymptomatic with  Precautions)  Once,   STAT       Question Answer Comment  Is this test for diagnosis or screening Screening   Symptomatic for COVID-19 as defined by CDC No   Hospitalized for COVID-19 No  Admitted to ICU for COVID-19 No   Previously tested for COVID-19 Yes   Resident in a congregate (group) care setting No   Employed in healthcare setting No   Has patient completed COVID vaccination(s) (2 doses of Pfizer/Moderna 1 dose of [employer]) Unknown      02/12/20 1115   02/12/20 0500  CBC  Daily,   R      02/11/20 1849          Vitals/Pain Today's Vitals   02/12/20 1500 02/12/20 1530 02/12/20 1600 02/12/20 1630  BP: (!) 158/75 (!) 142/63 (!) 105/94 137/66  Pulse: 63 64 64 61  Resp: (!) 27 (!) 27 (!) 28 (!) 29  Temp:      TempSrc:      SpO2: 97% 98% 98% 100%  Weight:      Height:      PainSc:        Isolation Precautions Airborne and Contact precautions  Medications Medications  aspirin EC tablet 81 mg (81 mg Oral Given 02/12/20 0956)  ferrous sulfate tablet 325 mg (325 mg Oral Given 02/12/20 0956)  levothyroxine (SYNTHROID) tablet 75 mcg (75 mcg Oral Not Given 02/12/20 0633)  metoprolol tartrate (LOPRESSOR) tablet 25 mg (25 mg Oral Given 02/12/20 0956)  pantoprazole (PROTONIX) EC tablet 40 mg (40 mg Oral Given 02/12/20 0956)  pravastatin (PRAVACHOL) tablet 40 mg (40 mg Oral Not Given 02/11/20 2341)  insulin aspart (novoLOG) injection 0-9 Units (0 Units Subcutaneous Not Given 02/12/20 1135)  insulin aspart (novoLOG) injection 0-5 Units (0 Units Subcutaneous Not Given 02/11/20 2343)  ondansetron (ZOFRAN) tablet 4 mg (has no administration in time range)    Or  ondansetron (ZOFRAN) injection 4 mg (has no administration in time range)  acetaminophen (TYLENOL) tablet 650 mg (has no administration in time range)    Or  acetaminophen (TYLENOL) suppository 650 mg (has no administration in time range)  polyethylene glycol (MIRALAX / GLYCOLAX) packet 17 g (17 g Oral Given 02/12/20  0956)  tamsulosin (FLOMAX) capsule 0.4 mg (0.4 mg Oral Not Given 02/11/20 2347)  LORazepam (ATIVAN) injection 0.5 mg ( Intravenous Canceled Entry 02/11/20 2347)  0.9 %  sodium chloride infusion ( Intravenous Rate/Dose Verify 02/12/20 1552)  enoxaparin (LOVENOX) injection 90 mg (has no administration in time range)  0.9 %  sodium chloride infusion (0 mLs Intravenous Stopped 02/11/20 1916)  iohexol (OMNIPAQUE) 350 MG/ML injection 75 mL (75 mLs Intravenous Contrast Given 02/11/20 1714)  heparin bolus via infusion 4,000 Units (4,000 Units Intravenous Bolus from Bag 02/11/20 1855)  sodium chloride 0.9 % bolus 1,000 mL (0 mLs Intravenous Stopped 02/12/20 0720)  LORazepam (ATIVAN) injection 0.5 mg (0.5 mg Intravenous Given 02/11/20 2124)  enoxaparin (LOVENOX) injection 90 mg (90 mg Subcutaneous Given 02/12/20 1411)    Mobility non-ambulatory High fall risk   Focused Assessments    R Recommendations: See Admitting Provider Note  Report given to:   Additional Notes:

## 2020-02-12 NOTE — Progress Notes (Signed)
PROGRESS NOTE   Ronnie Gregory  ZOX:[id number] DOB: [birth year] DOA: 02/11/2020 PCP: Ignatius SpeckingVyas, [name], MD   Chief Complaint  Patient presents with  . Failure To Thrive   Level of care: Telemetry  Brief Admission History:  [age] y.o. male with medical history significant for coronary artery disease, hypertension, DM, hypothyroidism. Patient was brought to the ED from [facility] nursing home reports of altered mental status, and poor appetite.  At time of my evaluation, patient is lethargic, opens eyes to voice, verbalizing but not answering questions, hence history is obtained from chart review and from talking to nursing home staff. Per staff since patient was admitted there last week, he has had a flat affect, not responding much and not eating, so they do not know what his baseline is. No vomiting or loose stools. He has had a poor appetite.  He has been at [facility] but with failure to thrive daughter had him sent to ED where he was found to have a right lung PE with pulmonary infarction.  PCCM recommended keeping at [facility] and providing IV heparin infusion.    Assessment & Plan:   Principal Problem:   Pulmonary embolus (HCC) Active Problems:   S/P coronary artery stent placement   Acute metabolic encephalopathy    Hyperlipidemia due to type 2 diabetes mellitus (HCC)   Vascular dementia without behavioral disturbance (HCC)   1. Pulmonary Embolus - involving right lung with subsequent pulmonary infarction.  He is on IV heparin infusion.  No increased work of breathing.  Follow and monitor telemetry.   2. Acute metabolic encephalopathy - he remains confused at this time, continue supportive measures.  3. Lactic acidosis - no infection found, treating with IV fluids.   4. CAD - stable, resume all home heart medication.  5. Chronic urinary retention - continue chronic indwelling foley, started on tamsulosin.  Outpatient urology follow up recommended.  6. Type 2 diabetes mellitus  uncontrolled as evidenced by a1c 7.3% continue SSI coverage and CBG monitoring.  7. Hypothyroidism - resume home levothyroxine.   8. Dementia - with precipitous decline in last couple of months and now admitted again with a pulmonary embolus I feel that a palliative care discussion was in line and have asked for a consultation for goals of care.    DVT prophylaxis: enoxaparin  Code Status: full  Family Communication:  Disposition:  Status is: Inpatient  Remains inpatient appropriate because:IV treatments appropriate due to intensity of illness or inability to take PO and Inpatient level of care appropriate due to severity of illness   Dispo: The patient is from: SNF              Anticipated d/c is to: SNF              Anticipated d/c date is: 2 days              Patient currently is not medically stable to d/c.   Difficult to place patient No   Consultants:   Palliative medicine   Procedures:     Antimicrobials:     Subjective: Pt nonverbal this morning.   Objective: Vitals:   02/12/20 0900 02/12/20 0930 02/12/20 1000 02/12/20 1030  BP: (!) 156/81 (!) 144/71 (!) 163/89 133/68  Pulse: 72 77 84 70  Resp:  (!) 33 (!) 31 (!) 26  Temp:      TempSrc:      SpO2: 96% 96% 97% 98%  Weight:  Height:        Intake/Output Summary (Last 24 hours) at 02/12/2020 1138 Last data filed at 02/12/2020 0751 Gross per 24 hour  Intake 2242.96 ml  Output 325 ml  Net 1917.96 ml   Filed Weights   02/11/20 1333 02/11/20 1334  Weight: 88.9 kg 88.9 kg    Examination:  General exam: elderly male, easily agitated with dementia, Appears calm and comfortable  Respiratory system: No increased work of breathing. Respiratory effort normal. Cardiovascular system: normal S1 & S2 heard. No JVD, murmurs, rubs, gallops or clicks. No pedal edema. Gastrointestinal system: Abdomen is nondistended, soft and nontender. No organomegaly or masses felt. Normal bowel sounds heard. Central nervous  system: Alert and oriented. No focal neurological deficits. Extremities: Symmetric 5 x 5 power. Skin: No rashes, lesions or ulcers Psychiatry: Judgement and insight appear poor. Mood & affect appropriate.   Data Reviewed: I have personally reviewed following labs and imaging studies  CBC: Recent Labs  Lab 02/09/20 0430 02/11/20 1542 02/12/20 0650  WBC 8.4 9.4 6.8  NEUTROABS 5.7 6.5  --   HGB 10.3* 10.9* 8.6*  HCT 32.4* 34.2* 27.8*  MCV 97.3 98.8 98.9  PLT 466* 643* 480*    Basic Metabolic Panel: Recent Labs  Lab 02/09/20 0430 02/11/20 1542  NA 137 133*  K 3.4* 3.6  CL 100 99  CO2 22 23  GLUCOSE 113* 135*  BUN 21 21  CREATININE 1.20 1.20  CALCIUM 8.5* 8.8*    GFR: Estimated Creatinine Clearance: 50.3 mL/min (by C-G formula based on SCr of 1.2 mg/dL).  Liver Function Tests: Recent Labs  Lab 02/09/20 0430 02/11/20 1542  AST 26 29  ALT 39 37  ALKPHOS 93 110  BILITOT 0.2* 0.4  PROT 6.1* 6.5  ALBUMIN 2.3* 2.5*    CBG: Recent Labs  Lab 02/05/20 1634 02/12/20 0905 02/12/20 1133  GLUCAP 101* 100* 108*    Recent Results (from the past 240 hour(s))  Resp Panel by RT-PCR (Flu A&B, Covid) Nasopharyngeal Swab     Status: None   Collection Time: 02/05/20 11:42 AM   Specimen: Nasopharyngeal Swab; Nasopharyngeal(NP) swabs in vial transport medium  Result Value Ref Range Status   SARS Coronavirus 2 by RT PCR NEGATIVE NEGATIVE Final    Comment: (NOTE) SARS-CoV-2 target nucleic acids are NOT DETECTED.  The SARS-CoV-2 RNA is generally detectable in upper respiratory specimens during the acute phase of infection. The lowest concentration of SARS-CoV-2 viral copies this assay can detect is 138 copies/mL. A negative result does not preclude SARS-Cov-2 infection and should not be used as the sole basis for treatment or other patient management decisions. A negative result may occur with  improper specimen collection/handling, submission of specimen other than  nasopharyngeal swab, presence of viral mutation(s) within the areas targeted by this assay, and inadequate number of viral copies(<138 copies/mL). A negative result must be combined with clinical observations, patient history, and epidemiological information. The expected result is Negative.  Fact Sheet for Patients:  BloggerCourse.com  Fact Sheet for Healthcare Providers:  SeriousBroker.it  This test is no t yet approved or cleared by the [place] FDA and  has been authorized for detection and/or diagnosis of SARS-CoV-2 by FDA under an Emergency Use Authorization (EUA). This EUA will remain  in effect (meaning this test can be used) for the duration of the COVID-19 declaration under Section 564(b)(1) of the Act, 21 U.S.C.section 360bbb-3(b)(1), unless the authorization is terminated  or revoked sooner.  Influenza A by PCR NEGATIVE NEGATIVE Final   Influenza B by PCR NEGATIVE NEGATIVE Final    Comment: (NOTE) The Xpert Xpress SARS-CoV-2/FLU/RSV plus assay is intended as an aid in the diagnosis of influenza from Nasopharyngeal swab specimens and should not be used as a sole basis for treatment. Nasal washings and aspirates are unacceptable for Xpert Xpress SARS-CoV-2/FLU/RSV testing.  Fact Sheet for Patients: BloggerCourse.com  Fact Sheet for Healthcare Providers: SeriousBroker.it  This test is not yet approved or cleared by the [place] FDA and has been authorized for detection and/or diagnosis of SARS-CoV-2 by FDA under an Emergency Use Authorization (EUA). This EUA will remain in effect (meaning this test can be used) for the duration of the COVID-19 declaration under Section 564(b)(1) of the Act, 21 U.S.C. section 360bbb-3(b)(1), unless the authorization is terminated or revoked.  Performed at [facility], [address], [place], [place] [address]   Blood  culture (routine x 2)     Status: None (Preliminary result)   Collection Time: 02/11/20  3:42 PM   Specimen: Right Antecubital; Blood  Result Value Ref Range Status   Specimen Description   Final    RIGHT ANTECUBITAL BOTTLES DRAWN AEROBIC AND ANAEROBIC   Special Requests Blood Culture adequate volume  Final   Culture   Final    NO GROWTH < 24 HOURS Performed at [facility], [address], [place], [place] [address]    Report Status PENDING  Incomplete  Blood culture (routine x 2)     Status: None (Preliminary result)   Collection Time: 02/11/20  3:46 PM   Specimen: BLOOD RIGHT HAND  Result Value Ref Range Status   Specimen Description BLOOD RIGHT HAND BOTTLES DRAWN AEROBIC ONLY  Final   Special Requests   Final    Blood Culture results may not be optimal due to an inadequate volume of blood received in culture bottles   Culture   Final    NO GROWTH < 24 HOURS Performed at [facility], [address], [place], [place] [address]    Report Status PENDING  Incomplete     Radiology Studies: DG Chest 2 View  Result Date: 02/11/2020 CLINICAL DATA:  Altered mental status. EXAM: CHEST - 2 VIEW COMPARISON:  Chest radiograph 06/30/2020. FINDINGS: Stable cardiac and mediastinal contours. Low lung volumes. Bibasilar atelectasis. No large area pulmonary consolidation. No pleural effusion or pneumothorax. IMPRESSION: No active cardiopulmonary disease. Electronically Signed   By: Annia Belt M.D.   On: 02/11/2020 15:01   CT Head Wo Contrast  Result Date: 02/11/2020 CLINICAL DATA:  Altered level of consciousness, decreased appetite, lethargy EXAM: CT HEAD WITHOUT CONTRAST TECHNIQUE: Contiguous axial images were obtained from the base of the skull through the vertex without intravenous contrast. COMPARISON:  01/31/2020 FINDINGS: Brain: Stable confluent hypodensities throughout the periventricular and subcortical white matter consistent with chronic small vessel ischemic change. No sign of acute  infarct or hemorrhage. Lateral ventricles and midline structures are stable. No acute extra-axial fluid collections. No mass effect. Vascular: No hyperdense vessel or unexpected calcification. Skull: Normal. Negative for fracture or focal lesion. Sinuses/Orbits: No acute finding. Other: None. IMPRESSION: 1. Stable chronic small vessel ischemic changes. No acute intracranial process. Electronically Signed   By: Sharlet Salina M.D.   On: 02/11/2020 14:56   CT ANGIO CHEST PE W OR WO CONTRAST  Result Date: 02/11/2020 CLINICAL DATA:  87 year old male with history of lethargy. Evaluate for pulmonary embolism. EXAM: CT ANGIOGRAPHY CHEST WITH CONTRAST TECHNIQUE: Multidetector  CT imaging of the chest was performed using the standard protocol during bolus administration of intravenous contrast. Multiplanar CT image reconstructions and MIPs were obtained to evaluate the vascular anatomy. CONTRAST:  58mL OMNIPAQUE IOHEXOL 350 MG/ML SOLN COMPARISON:  No priors. FINDINGS: Cardiovascular: There are numerous filling defects within the distal right main, lobar, segmental and subsegmental sized branches of the pulmonary arteries to the right lung, indicative of pulmonary embolism. No definite left-sided filling defects are noted. Heart size is normal. There is no significant pericardial fluid, thickening or pericardial calcification. There is aortic atherosclerosis, as well as atherosclerosis of the great vessels of the mediastinum and the coronary arteries, including calcified atherosclerotic plaque in the left anterior descending and right coronary arteries. Mediastinum/Nodes: No pathologically enlarged mediastinal or hilar lymph nodes. Please note that accurate exclusion of hilar adenopathy is limited on noncontrast CT scans. Esophagus is unremarkable in appearance. No axillary lymphadenopathy. Lungs/Pleura: There is a small amount of dependent airspace consolidation in the right lower lobe. Lungs are otherwise clear. No  suspicious appearing pulmonary nodules or masses are noted. No pleural effusions. Upper Abdomen: Aortic atherosclerosis. Musculoskeletal: There are no aggressive appearing lytic or blastic lesions noted in the visualized portions of the skeleton. Review of the MIP images confirms the above findings. IMPRESSION: 1. Study is positive for pulmonary embolism in the right lung. In addition, there is some airspace consolidation in the right lower lobe which may represent alveolar hemorrhage from pulmonary infarction. 2. Aortic atherosclerosis, in addition to 2 vessel coronary artery disease. Please note that although the presence of coronary artery calcium documents the presence of coronary artery disease, the severity of this disease and any potential stenosis cannot be assessed on this non-gated CT examination. Assessment for potential risk factor modification, dietary therapy or pharmacologic therapy may be warranted, if clinically indicated. Critical Value/emergent results were called by telephone at the time of interpretation on 02/11/2020 at 5:50 pm to provider ABIGAIL HARRIS , who verbally acknowledged these results. Aortic Atherosclerosis (ICD10-I70.0). Electronically Signed   By: Trudie Reed M.D.   On: 02/11/2020 17:51   US Venous Img Lower Bilateral (DVT)  Result Date: 02/12/2020 CLINICAL DATA:  History of pulmonary emboli EXAM: BILATERAL LOWER EXTREMITY VENOUS DOPPLER ULTRASOUND TECHNIQUE: Gray-scale sonography with graded compression, as well as color Doppler and duplex ultrasound were performed to evaluate the lower extremity deep venous systems from the level of the common femoral vein and including the common femoral, femoral, profunda femoral, popliteal and calf veins including the posterior tibial, peroneal and gastrocnemius veins when visible. The superficial great saphenous vein was also interrogated. Spectral Doppler was utilized to evaluate flow at rest and with distal augmentation maneuvers  in the common femoral, femoral and popliteal veins. The left lower leg was not evaluated due to the combative nature of the patient. COMPARISON:  None. FINDINGS: RIGHT LOWER EXTREMITY Common Femoral Vein: No evidence of thrombus. Normal compressibility, respiratory phasicity and response to augmentation. Saphenofemoral Junction: No evidence of thrombus. Normal compressibility and flow on color Doppler imaging. Profunda Femoral Vein: No evidence of thrombus. Normal compressibility and flow on color Doppler imaging. Femoral Vein: No evidence of thrombus. Normal compressibility, respiratory phasicity and response to augmentation. Popliteal Vein: No evidence of thrombus. Normal compressibility, respiratory phasicity and response to augmentation. Calf Veins: No evidence of thrombus. Normal compressibility and flow on color Doppler imaging. Superficial Great Saphenous Vein: No evidence of thrombus. Normal compressibility. Venous Reflux:  None. Other Findings:  None. LEFT LOWER EXTREMITY Common Femoral Vein:  No evidence of thrombus. Normal compressibility, respiratory phasicity and response to augmentation. Saphenofemoral Junction: No evidence of thrombus. Normal compressibility and flow on color Doppler imaging. Profunda Femoral Vein: No evidence of thrombus. Normal compressibility and flow on color Doppler imaging. Femoral Vein: No evidence of thrombus. Normal compressibility, respiratory phasicity and response to augmentation. Popliteal Vein: Not evaluated Calf Veins: Not evaluate Superficial Great Saphenous Vein: Not evaluate Venous Reflux:  None. Other Findings:  None. IMPRESSION: No evidence of deep venous thrombosis is noted. The left lower leg was incompletely evaluated due to the patient's combative nature. Electronically Signed   By: Alcide CleverMark  Lukens M.D.   On: 02/12/2020 10:08   Scheduled Meds: . aspirin EC  81 mg Oral Q breakfast  . ferrous sulfate  325 mg Oral BID WC  . insulin aspart  0-5 Units Subcutaneous  QHS  . insulin aspart  0-9 Units Subcutaneous TID WC  . levothyroxine  75 mcg Oral QAC breakfast  . LORazepam  0.5 mg Intravenous Once  . metoprolol tartrate  25 mg Oral BID  . pantoprazole  40 mg Oral Daily  . polyethylene glycol  17 g Oral Daily  . pravastatin  40 mg Oral QHS  . tamsulosin  0.4 mg Oral QPC supper   Continuous Infusions: . sodium chloride 75 mL/hr at 02/12/20 0751  . heparin 1,250 Units/hr (02/12/20 1059)    LOS: 1 day   Time spent: 35 mins   Mathhew Buysse Laural BenesJohnson, MD How to contact the [facility] Attending or Consulting provider 7A - 7P or covering provider during after hours 7P -7A, for this patient?  1. Check the care team in [facility] and look for a) attending/consulting [facility] provider listed and b) the [facility] team listed 2. Log into [email or web address] and use [place]'s universal password to access. If you do not have the password, please contact the hospital operator. 3. Locate the [facility] provider you are looking for under [facility] and page to a number that you can be directly reached. 4. If you still have difficulty reaching the provider, please page the [facility] (Director on Call) for the Hospitalists listed on amion for assistance.  02/12/2020, 11:38 AM

## 2020-02-12 NOTE — ED Notes (Signed)
Attempted to feed the patient lunch, patient spit mashed potatoes across the room, refused to eat.

## 2020-02-12 NOTE — Progress Notes (Signed)
ANTICOAGULATION CONSULT NOTE - Follow Up Consult  Pharmacy Consult for heparin Indication: pulmonary embolus  Labs: Recent Labs    02/11/20 1542 02/11/20 1543 02/11/20 2308 02/12/20 0632  HGB 10.9*  --   --   --   HCT 34.2*  --   --   --   PLT 643*  --   --   --   HEPARINUNFRC  --   --   --  0.84*  CREATININE 1.20  --   --   --   TROPONINIHS  --  324* 285*  --     Assessment: 86yo male supratherapeutic on heparin with initial dosing for PE.  Goal of Therapy:  Heparin level 0.3-0.7 units/ml   Plan:  Will decrease heparin gtt by 2 units/kg/hr to 1250 units/hr and check level in 8 hours.    Ronnie Gregory, PharmD, BCPS  02/12/2020,7:10 AM

## 2020-02-12 NOTE — Consult Note (Signed)
Consultation Note Date: 02/12/2020   Patient Name: Ronnie Gregory  DOB: 08/28/1933  MRN: [id number]  Age / Sex: 87 y.o., male  PCP: Glenda Chroman, MD Referring Physician: Murlean Iba, MD  Reason for Consultation: Establishing goals of care  HPI/Patient Profile: 87 y.o. male  with past medical history of CAD, hypertension, diabetes, hypothyroidism, recent hospitalization 01/09/20-01/25/20 at [facility] with COVID infection and went to SNF, hospitalized 1/12-1/17/22 for SIRS with acute metabolic encephalopathy and acute kidney injury but no infection identified admitted on 02/11/2020 with altered mental status, poor appetite and found to have pulmonary embolism.   Clinical Assessment and Goals of Care: I met today at Mr. Duggar bedside. No family present. He is resting comfortably. He is very hard of hearing. He tells me that he wants a "coca cola" although none found in ED. He denies pain/discomfort. He speaks clearly when he does answer me. He does not seem aware of any of his health situation and diagnosis of blood clot in lungs. I asked if I should call Jenny Reichmann or Freda Munro with an update and he tells me "Jenny Reichmann."   I called and spoke with daughter, Jenny Reichmann. Jenny Reichmann shares with me that he was living alone and independent prior to COVID infection. He had no real health limitations. He wasn't driving but was able to drive just not doing so to limit COVID risk. Jenny Reichmann shares that he has not returned to himself since COVID infection. He has remained confused although he does know who she is at times. His confusion and poor appetite has been ongoing. He has good days and bad days.   I discussed with Jenny Reichmann concern that this may not improve and that he could potentially continue to get worse. We discussed failure to thrive. Discussed the effects of post-Covid syndrome on some patients and that some never truly  improve. Jenny Reichmann understands. She is still hopeful that with time her father will improve. We discussed full treatment and she desires full code status at this time. We did discuss concern that if he worsened to the point of requiring resuscitation I would worry more about what his quality of life would look like for him especially seeing how COVID has effected him. Jenny Reichmann understands and we know only time will tell. She will like to see how he does moving forward and if treatment of PE will lead to further improvement. I reassured her that we will continue to support him and provide optimal treatment to give him every chance of improvement.   All questions/concerns addressed. Emotional support provided.   Primary Decision Maker HCPOA daughter Jenny Reichmann    SUMMARY OF RECOMMENDATIONS   - Full code and full aggressive care desired with hopes of eventual improvement - I asked Jenny Reichmann to bring copy of his Living Will to place on file in medical record  Code Status/Advance Care Planning:  Full code   Symptom Management:   Per attending  Palliative Prophylaxis:   Aspiration, Bowel Regimen, Delirium Protocol, Frequent Pain Assessment, Oral Care and  Turn Reposition  Additional Recommendations (Limitations, Scope, Preferences):  Full Scope Treatment  Psycho-social/Spiritual:   Desire for further Chaplaincy support:no  Additional Recommendations: Caregiving  Support/Resources  Prognosis:   Overall long term prognosis guarded.   Discharge Planning: To Be Determined      Primary Diagnoses: Present on Admission: . Pulmonary embolus ([place]) . Acute metabolic encephalopathy  . Hyperlipidemia due to type 2 diabetes mellitus ([place]) . Vascular dementia without behavioral disturbance ([place])   I have reviewed the medical record, interviewed the patient and family, and examined the patient. The following aspects are pertinent.  Past Medical History:  Diagnosis Date  . Arthritis   . Chronic  kidney disease   . Constipation    uses stool softener daily  . Foley catheter in place    urinary retention  . GERD (gastroesophageal reflux disease)   . Hypercholesteremia   . Hypertension   . Hypothyroidism   . Myocardial infarction [facility]) 2009   Dr. Cathren Harsh cardiologist 2 stents   Social History   Socioeconomic History  . Marital status: Widowed    Spouse name: Not on file  . Number of children: Not on file  . Years of education: Not on file  . Highest education level: Not on file  Occupational History  . Not on file  Tobacco Use  . Smoking status: Former Smoker    Start date: 01/20/1955    Quit date: 01/20/1960    Years since quitting: 60.1  . Smokeless tobacco: Never Used  Vaping Use  . Vaping Use: Never used  Substance and Sexual Activity  . Alcohol use: No    Alcohol/week: 0.0 standard drinks  . Drug use: No  . Sexual activity: Not on file  Other Topics Concern  . Not on file  Social History Narrative  . Not on file   Social Determinants of Health   Financial Resource Strain: Not on file  Food Insecurity: Not on file  Transportation Needs: Not on file  Physical Activity: Not on file  Stress: Not on file  Social Connections: Not on file   Family History  Problem Relation Age of Onset  . Alzheimer's disease Mother    Scheduled Meds: . aspirin EC  81 mg Oral Q breakfast  . ferrous sulfate  325 mg Oral BID WC  . insulin aspart  0-5 Units Subcutaneous QHS  . insulin aspart  0-9 Units Subcutaneous TID WC  . levothyroxine  75 mcg Oral QAC breakfast  . LORazepam  0.5 mg Intravenous Once  . metoprolol tartrate  25 mg Oral BID  . pantoprazole  40 mg Oral Daily  . polyethylene glycol  17 g Oral Daily  . pravastatin  40 mg Oral QHS  . tamsulosin  0.4 mg Oral QPC supper   Continuous Infusions: . sodium chloride 75 mL/hr at 02/12/20 0751  . heparin 1,250 Units/hr (02/12/20 0711)   PRN Meds:.acetaminophen **OR** acetaminophen, ondansetron **OR**  ondansetron (ZOFRAN) IV Allergies  Allergen Reactions  . Hydrocodone     Other reaction(s): Confusion, Hallucinations  . Mobic [Meloxicam] Other (See Comments)    Mucus   Review of Systems  Unable to perform ROS: Other  Constitutional:       Hard of hearing and confusion    Physical Exam Vitals and nursing note reviewed.  Constitutional:      General: He is not in acute distress.    Appearance: He is ill-appearing.  Cardiovascular:     Rate and Rhythm: Normal rate.  Pulmonary:     Effort: No tachypnea, accessory muscle usage or respiratory distress.  Abdominal:     Palpations: Abdomen is soft.  Neurological:     Mental Status: He is alert and easily aroused.     Comments: Oriented x 1 but with some appropriate responses; difficult to say how much confusion is impacting by hearing deficiency     Vital Signs: BP (!) 138/95   Pulse 71   Temp 97.8 F (36.6 C) (Oral)   Resp 17   Ht 6' (1.829 m)   Wt 88.9 kg   SpO2 97%   BMI 26.58 kg/m  Pain Scale: 0-10   Pain Score: 0-No pain   SpO2: SpO2: 97 % O2 Device:SpO2: 97 % O2 Flow Rate: .O2 Flow Rate (L/min): 2 L/min  IO: Intake/output summary:   Intake/Output Summary (Last 24 hours) at 02/12/2020 0901 Last data filed at 02/12/2020 0751 Gross per 24 hour  Intake 2242.96 ml  Output 325 ml  Net 1917.96 ml    LBM:   Baseline Weight: Weight: 88.9 kg Most recent weight: Weight: 88.9 kg     Palliative Assessment/Data:     Time In: 1315 Time Out: 1405 Time Total: 50 min Greater than 50%  of this time was spent counseling and coordinating care related to the above assessment and plan.  Signed by: Vinie Sill, NP Palliative Medicine Team Pager # [phone] (M-F 8a-5p) Team Phone # [phone] (Nights/Weekends)

## 2020-02-12 NOTE — Progress Notes (Signed)
ANTICOAGULATION CONSULT NOTE - Initial Consult  Pharmacy Consult for heparin gtt>> Lovenox Indication: pulmonary embolus  Allergies  Allergen Reactions  . Hydrocodone     Other reaction(s): Confusion, Hallucinations  . Mobic [Meloxicam] Other (See Comments)    Mucus    Patient Measurements: Height: 6' (182.9 cm) Weight: 88.9 kg (196 lb) IBW/kg (Calculated) : 77.6 Heparin Dosing Weight: HEPARIN DW (KG): 88.9   Vital Signs: BP: 136/69 (01/24 1330) Pulse Rate: 61 (01/24 1330)  Labs: Recent Labs    02/11/20 1542 02/12/20 0632 02/12/20 0650  HGB 10.9*  --  8.6*  HCT 34.2*  --  27.8*  PLT 643*  --  480*  HEPARINUNFRC  --  0.84*  --   CREATININE 1.20  --   --     Estimated Creatinine Clearance: 50.3 mL/min (by C-G formula based on SCr of 1.2 mg/dL).   Medical History: Past Medical History:  Diagnosis Date  . Arthritis   . Chronic kidney disease   . Constipation    uses stool softener daily  . Foley catheter in place    urinary retention  . GERD (gastroesophageal reflux disease)   . Hypercholesteremia   . Hypertension   . Hypothyroidism   . Myocardial infarction [facility]) 2009   Dr. Caryn Section cardiologist 2 stents    Medications:  (Not in a hospital admission)  Scheduled:  . aspirin EC  81 mg Oral Q breakfast  . enoxaparin (LOVENOX) injection  90 mg Subcutaneous Once  . [START ON 02/13/2020] enoxaparin (LOVENOX) injection  90 mg Subcutaneous Q12H  . ferrous sulfate  325 mg Oral BID WC  . insulin aspart  0-5 Units Subcutaneous QHS  . insulin aspart  0-9 Units Subcutaneous TID WC  . levothyroxine  75 mcg Oral QAC breakfast  . LORazepam  0.5 mg Intravenous Once  . metoprolol tartrate  25 mg Oral BID  . pantoprazole  40 mg Oral Daily  . polyethylene glycol  17 g Oral Daily  . pravastatin  40 mg Oral QHS  . tamsulosin  0.4 mg Oral QPC supper   Infusions:  . sodium chloride 75 mL/hr at 02/12/20 0751   PRN:  Anti-infectives (From admission, onward)    None      Assessment: Ronnie Gregory a 87 y.o. male requires anticoagulation with a heparin iv infusion for the indication of  pulmonary embolus. Heparin gtt will be started following pharmacy protocol per pharmacy consult. Patient is not on previous oral anticoagulant that will require aPTT/HL correlation before transitioning to only HL monitoring.   D/w Dr. Laural Benes today and we will change heparin to SQ lovenox for his PE.   Hgb 10.9, plt wnl Scr 1.2>>CrCl ~50 ml/min  Goal of Therapy:  Anti-xa =  0.6-1 Monitor platelets by anticoagulation protocol: Yes   Plan:   Dc heparin Lovenox 90mg  SQ BID F/u with bleeding  , PharmD, BCIDP, AAHIVP, CPP Infectious Disease Pharmacist 02/12/2020 1:48 PM

## 2020-02-12 NOTE — ED Notes (Signed)
Report attempted x 1

## 2020-02-12 NOTE — ED Notes (Signed)
Pt ate approx 1/4 breakfast tray.

## 2020-02-12 NOTE — ED Notes (Signed)
US at bedside

## 2020-02-13 ENCOUNTER — Inpatient Hospital Stay (HOSPITAL_COMMUNITY): Payer: Medicare Other

## 2020-02-13 ENCOUNTER — Encounter (HOSPITAL_COMMUNITY): Payer: Self-pay | Admitting: Internal Medicine

## 2020-02-13 DIAGNOSIS — R9431 Abnormal electrocardiogram [ECG] [EKG]: Secondary | ICD-10-CM | POA: Diagnosis not present

## 2020-02-13 DIAGNOSIS — I2699 Other pulmonary embolism without acute cor pulmonale: Secondary | ICD-10-CM | POA: Diagnosis not present

## 2020-02-13 LAB — CBC
HCT: 26 % — ABNORMAL LOW (ref 39.0–52.0)
Hemoglobin: 8.2 g/dL — ABNORMAL LOW (ref 13.0–17.0)
MCH: 30.8 pg (ref 26.0–34.0)
MCHC: 31.5 g/dL (ref 30.0–36.0)
MCV: 97.7 fL (ref 80.0–100.0)
Platelets: 529 10*3/uL — ABNORMAL HIGH (ref 150–400)
RBC: 2.66 MIL/uL — ABNORMAL LOW (ref 4.22–5.81)
RDW: 13.3 % (ref 11.5–15.5)
WBC: 6 10*3/uL (ref 4.0–10.5)
nRBC: 0 % (ref 0.0–0.2)

## 2020-02-13 LAB — ECHOCARDIOGRAM COMPLETE
AR max vel: 2.93 cm2
AV Area VTI: 3.26 cm2
AV Area mean vel: 2.9 cm2
AV Mean grad: 3.6 mmHg
AV Peak grad: 6.9 mmHg
Ao pk vel: 1.31 m/s
Area-P 1/2: 3.26 cm2
Height: 67 in
S' Lateral: 1.89 cm
Weight: 2998.26 oz

## 2020-02-13 LAB — GLUCOSE, CAPILLARY
Glucose-Capillary: 82 mg/dL (ref 70–99)
Glucose-Capillary: 109 mg/dL — ABNORMAL HIGH (ref 70–99)
Glucose-Capillary: 119 mg/dL — ABNORMAL HIGH (ref 70–99)
Glucose-Capillary: 102 mg/dL — ABNORMAL HIGH (ref 70–99)

## 2020-02-13 LAB — BASIC METABOLIC PANEL
Anion gap: 7 (ref 5–15)
BUN: 12 mg/dL (ref 8–23)
CO2: 21 mmol/L — ABNORMAL LOW (ref 22–32)
Calcium: 7.8 mg/dL — ABNORMAL LOW (ref 8.9–10.3)
Chloride: 105 mmol/L (ref 98–111)
Creatinine, Ser: 0.99 mg/dL (ref 0.61–1.24)
GFR, Estimated: >60 mL/min (ref >60)
Glucose, Bld: 114 mg/dL — ABNORMAL HIGH (ref 70–99)
Potassium: 3.2 mmol/L — ABNORMAL LOW (ref 3.5–5.1)
Sodium: 133 mmol/L — ABNORMAL LOW (ref 135–145)

## 2020-02-13 NOTE — TOC Initial Note (Signed)
Transition of Care [facility]) - Initial/Assessment Note    Patient Details  Name: Ronnie Gregory MRN: [id number] Date of Birth: Jan 03, 1934  Transition of Care [facility]) CM/SW Contact:    Karn Cassis, LCSW Phone Number: 02/13/2020, 11:10 AM  Clinical Narrative:  Pt admitted due to pulmonary embolism. He has been a resident at [facility] for about a week. Per Lynnea Ferrier at facility, pt is okay to return and no FL2 needed. Authorization will need to be started prior to d/c. MD aware PT evaluation is needed for auth. Palliative following. LCSW spoke with pt's daughter, [name] as pt oriented to self only. Shelia requests return to [facility] when medically stable. She reports Arline Asp is HCPOA. LCSW attempted to reach [place], but no answer. Shelia plans to discuss with Arline Asp and they will notify TOC if anything changes.                 Expected Discharge Plan: Skilled Nursing Facility Barriers to Discharge: Continued Medical Work up   Patient Goals and CMS Choice     Choice offered to / list presented to : Adult Children  Expected Discharge Plan and Services Expected Discharge Plan: Skilled Nursing Facility In-house Referral: Clinical Social Work   Post Acute Care Choice: Skilled Nursing Facility Living arrangements for the past 2 months: Single Family Home,Skilled Nursing Facility                   DME Agency: NA                  Prior Living Arrangements/Services Living arrangements for the past 2 months: Single Family Home,Skilled Nursing Facility Lives with:: Facility Resident Patient language and need for interpreter reviewed:: Yes Do you feel safe going back to the place where you live?: Yes      Need for Family Participation in Patient Care: Yes (Comment) Care giver support system in place?: Yes (comment)   Criminal Activity/Legal Involvement Pertinent to Current Situation/Hospitalization: No - Comment as needed  Activities of Daily Living Home Assistive Devices/Equipment:  Hospital bed,Oxygen ADL Screening (condition at time of admission) Patient's cognitive ability adequate to safely complete daily activities?: No Is the patient deaf or have difficulty hearing?: Yes Does the patient have difficulty seeing, even when wearing glasses/contacts?: Yes Does the patient have difficulty concentrating, remembering, or making decisions?: Yes Patient able to express need for assistance with ADLs?: Yes Does the patient have difficulty dressing or bathing?: Yes Independently performs ADLs?: No Communication: Independent Dressing (OT): Needs assistance Is this a change from baseline?: Pre-admission baseline Grooming: Needs assistance Is this a change from baseline?: Pre-admission baseline Feeding: Needs assistance Is this a change from baseline?: Pre-admission baseline Bathing: Needs assistance Is this a change from baseline?: Pre-admission baseline Toileting: Needs assistance Is this a change from baseline?: Pre-admission baseline In/Out Bed: Needs assistance Is this a change from baseline?: Pre-admission baseline Walks in Home: Needs assistance Is this a change from baseline?: Pre-admission baseline Does the patient have difficulty walking or climbing stairs?: Yes Weakness of Legs: Both Weakness of Arms/Hands: None  Permission Sought/Granted         Permission granted to share info w AGENCY: [facility]  Permission granted to share info w Relationship: SNF     Emotional Assessment       Orientation: : Oriented to Self Alcohol / Substance Use: Not Applicable Psych Involvement: No (comment)  Admission diagnosis:  Pulmonary embolus (HCC) [I26.99] Pulmonary embolism (HCC) [I26.99] Pulmonary embolus and infarction (HCC) [I26.99]  Patient Active Problem List   Diagnosis Date Noted  . Pulmonary embolus (HCC) 02/11/2020  . AKI (acute kidney injury) (HCC) 02/08/2020  . Hypertension associated with diabetes (HCC) 02/07/2020  . Controlled type 2 diabetes mellitus  with chronic kidney disease, with long-term current use of insulin (HCC) 02/07/2020  . Stage 3 chronic kidney disease due to diabetes mellitus (HCC) 02/07/2020  . Hyperlipidemia due to type 2 diabetes mellitus (HCC) 02/07/2020  . Vascular dementia without behavioral disturbance (HCC) 02/07/2020  . Depression due to dementia (HCC) 02/07/2020  . Hypothyroidism in adult 02/07/2020  . GERD without esophagitis 02/07/2020  . Cauda equina syndrome (HCC) 02/07/2020  . Chronic constipation 02/07/2020  . Chronic iron deficiency anemia 02/07/2020  . Chronic retention of urine 02/07/2020  . SIRS (systemic inflammatory response syndrome) (HCC) 01/31/2020  . Sepsis secondary to UTI (HCC) 01/31/2020  . H/o COVID-19 virus infection--+ve 01/09/20 , negative 01/25/20 (vaccinated Fall of 2021) 01/31/2020  . Acute metabolic encephalopathy  01/31/2020  . CRI (chronic renal insufficiency), stage 3 (moderate) (HCC) 05/28/2017  . Carotid artery disease (HCC) 05/28/2017  . Spinal stenosis, lumbar region, with neurogenic claudication 10/16/2014  . CAD (coronary artery disease) 12/02/2012  . S/P coronary artery stent placement 12/02/2012   PCP:  Ignatius Specking, MD Pharmacy:   [facility] - [place], [place] - [address] [place] [address] Phone: [phone] Fax: [phone]     Social Determinants of Health (SDOH) Interventions    Readmission Risk Interventions Readmission Risk Prevention Plan 02/02/2020  Medication Screening Complete  Transportation Screening Complete  Some recent data might be hidden

## 2020-02-13 NOTE — TOC Progression Note (Signed)
Transition of Care [facility]) - Progression Note    Patient Details  Name: Ronnie Gregory MRN: [id number] Date of Birth: Nov 27, 1933  [facility]) CM/SW Contact  Karn Cassis, [place] Phone Number: 02/13/2020, 1:25 PM  Clinical Narrative:  LCSW spoke with Arline Asp, pt's daughter who states she is HCPOA. Arline Asp confirms plan to return to [facility] when medically stable. She wants to be called instead of sister, Velna Hatchet since she is HCPOA. TOC will continue to follow.      Expected Discharge Plan: Skilled Nursing Facility Barriers to Discharge: Continued Medical Work up  Expected Discharge Plan and Services Expected Discharge Plan: Skilled Nursing Facility In-house Referral: Clinical Social Work   Post Acute Care Choice: Skilled Nursing Facility Living arrangements for the past 2 months: Single Family Home,Skilled Nursing Facility                   DME Agency: NA                   Social Determinants of Health (SDOH) Interventions    Readmission Risk Interventions Readmission Risk Prevention Plan 02/02/2020  Medication Screening Complete  Transportation Screening Complete  Some recent data might be hidden

## 2020-02-13 NOTE — Progress Notes (Signed)
PROGRESS NOTE   Wyvonnia DuskyWilliam F Gregory  ZOX:[id number] DOB: Jul 15, 1933 DOA: 02/11/2020 PCP: Ignatius SpeckingVyas, [name], MD   Chief Complaint  Patient presents with  . Failure To Thrive   Level of care: Telemetry  Brief Admission History:  87 y.o. male with medical history significant for coronary artery disease, hypertension, DM, hypothyroidism. Patient was brought to the ED from [facility] nursing home reports of altered mental status, and poor appetite.  At time of my evaluation, patient is lethargic, opens eyes to voice, verbalizing but not answering questions, hence history is obtained from chart review and from talking to nursing home staff. Per staff since patient was admitted there last week, he has had a flat affect, not responding much and not eating, so they do not know what his baseline is. No vomiting or loose stools. He has had a poor appetite.  He has been at [facility] but with failure to thrive daughter had him sent to ED where he was found to have a right lung PE with pulmonary infarction.  PCCM recommended keeping at [facility] and providing IV heparin infusion.    Assessment & Plan:   Principal Problem:   Pulmonary embolus (HCC) Active Problems:   S/P coronary artery stent placement   Acute metabolic encephalopathy    Hyperlipidemia due to type 2 diabetes mellitus (HCC)   Vascular dementia without behavioral disturbance (HCC)   1. Acute respiratory failure with hypoxia secondary to acute right-sided pulmonary Embolus - involving right lung with subsequent pulmonary infarction.  He is now on Lovenox full dose.  Echo done, results pending.  Patient looks comfortable.  Too lethargic to tell me if he was having any shortness of breath but he did not seem to have any dyspnea.  Requiring only 2 L of oxygen now.  Try to wean oxygen. 2. Acute metabolic encephalopathy -after reviewing chart, it appears that patient does have some sort of confusion at baseline but currently he is too lethargic to hold  any conversation with me. 3. Lactic acidosis - no infection found, treating with IV fluids.  Resolved. 4. CAD - stable, resume all home heart medication.  5. Chronic urinary retention - continue chronic indwelling foley, started on tamsulosin.  Outpatient urology follow up recommended.  6. Type 2 diabetes mellitus uncontrolled as evidenced by a1c 7.3% continue SSI coverage and CBG monitoring.  7. Hypothyroidism -continue home levothyroxine.   8. Dementia - with precipitous decline in last couple of months and now admitted again with a pulmonary embolus. 9. GOC: Palliative care on board.  They discussed with daughter.  She wants him to remain full code.  DVT prophylaxis: enoxaparin  Code Status: full  Family Communication: None at bedside. Disposition:  Status is: Inpatient  Remains inpatient appropriate because:IV treatments appropriate due to intensity of illness or inability to take PO and Inpatient level of care appropriate due to severity of illness   Dispo: The patient is from: SNF              Anticipated d/c is to: SNF              Anticipated d/c date is: 2 days              Patient currently is not medically stable to d/c.   Difficult to place patient No   Consultants:   Palliative medicine   Procedures:     Antimicrobials:     Subjective: Patient seen and examined.  He is lethargic and too  weak to have any sort of conversation.  He is trying to follow commands and trying to move his extremities when asked but again he is too weak to do so.  Objective: Vitals:   02/13/20 0207 02/13/20 0624 02/13/20 0810 02/13/20 1000  BP: 136/82 140/65 135/77 132/76  Pulse: 78 77 80 79  Resp: 20  20 19   Temp: 98.2 F (36.8 C) 98.7 F (37.1 C)  98.7 F (37.1 C)  TempSrc: Axillary Axillary  Oral  SpO2: 98% 97% 97% 97%  Weight:      Height:        Intake/Output Summary (Last 24 hours) at 02/13/2020 1108 Last data filed at 02/13/2020 0900 Gross per 24 hour  Intake 437.69  ml  Output 1400 ml  Net -962.31 ml   Filed Weights   02/11/20 1333 02/11/20 1334 02/12/20 1851  Weight: 88.9 kg 88.9 kg 85 kg    Examination:  General exam: Appears calm and comfortable but significantly weak and lethargic Respiratory system: Diminished breath sounds with poor inspiratory effort Cardiovascular system: S1 & S2 heard, RRR. No JVD, murmurs, rubs, gallops or clicks. No pedal edema. Gastrointestinal system: Abdomen is nondistended, soft and nontender. No organomegaly or masses felt. Normal bowel sounds heard. Central nervous system: Very lethargic. Extremities: Symmetric 5 x 5 power.  Data Reviewed: I have personally reviewed following labs and imaging studies  CBC: Recent Labs  Lab 02/09/20 0430 02/11/20 1542 02/12/20 0650 02/13/20 0534  WBC 8.4 9.4 6.8 6.0  NEUTROABS 5.7 6.5  --   --   HGB 10.3* 10.9* 8.6* 8.2*  HCT 32.4* 34.2* 27.8* 26.0*  MCV 97.3 98.8 98.9 97.7  PLT 466* 643* 480* 529*    Basic Metabolic Panel: Recent Labs  Lab 02/09/20 0430 02/11/20 1542  NA 137 133*  K 3.4* 3.6  CL 100 99  CO2 22 23  GLUCOSE 113* 135*  BUN 21 21  CREATININE 1.20 1.20  CALCIUM 8.5* 8.8*    GFR: Estimated Creatinine Clearance: 47.8 mL/min (by C-G formula based on SCr of 1.2 mg/dL).  Liver Function Tests: Recent Labs  Lab 02/09/20 0430 02/11/20 1542  AST 26 29  ALT 39 37  ALKPHOS 93 110  BILITOT 0.2* 0.4  PROT 6.1* 6.5  ALBUMIN 2.3* 2.5*    CBG: Recent Labs  Lab 02/12/20 0905 02/12/20 1133 02/12/20 1734 02/12/20 2113 02/13/20 0806  GLUCAP 100* 108* 90 89 82    Recent Results (from the past 240 hour(s))  Resp Panel by RT-PCR (Flu A&B, Covid) Nasopharyngeal Swab     Status: None   Collection Time: 02/05/20 11:42 AM   Specimen: Nasopharyngeal Swab; Nasopharyngeal(NP) swabs in vial transport medium  Result Value Ref Range Status   SARS Coronavirus 2 by RT PCR NEGATIVE NEGATIVE Final    Comment: (NOTE) SARS-CoV-2 target nucleic acids are  NOT DETECTED.  The SARS-CoV-2 RNA is generally detectable in upper respiratory specimens during the acute phase of infection. The lowest concentration of SARS-CoV-2 viral copies this assay can detect is 138 copies/mL. A negative result does not preclude SARS-Cov-2 infection and should not be used as the sole basis for treatment or other patient management decisions. A negative result may occur with  improper specimen collection/handling, submission of specimen other than nasopharyngeal swab, presence of viral mutation(s) within the areas targeted by this assay, and inadequate number of viral copies(<138 copies/mL). A negative result must be combined with clinical observations, patient history, and epidemiological information. The expected result is Negative.  Fact Sheet for Patients:  BloggerCourse.com  Fact Sheet for Healthcare Providers:  SeriousBroker.it  This test is no t yet approved or cleared by the [place] FDA and  has been authorized for detection and/or diagnosis of SARS-CoV-2 by FDA under an Emergency Use Authorization (EUA). This EUA will remain  in effect (meaning this test can be used) for the duration of the COVID-19 declaration under Section 564(b)(1) of the Act, 21 U.S.C.section 360bbb-3(b)(1), unless the authorization is terminated  or revoked sooner.       Influenza A by PCR NEGATIVE NEGATIVE Final   Influenza B by PCR NEGATIVE NEGATIVE Final    Comment: (NOTE) The Xpert Xpress SARS-CoV-2/FLU/RSV plus assay is intended as an aid in the diagnosis of influenza from Nasopharyngeal swab specimens and should not be used as a sole basis for treatment. Nasal washings and aspirates are unacceptable for Xpert Xpress SARS-CoV-2/FLU/RSV testing.  Fact Sheet for Patients: BloggerCourse.com  Fact Sheet for Healthcare Providers: SeriousBroker.it  This test is not  yet approved or cleared by the [place] FDA and has been authorized for detection and/or diagnosis of SARS-CoV-2 by FDA under an Emergency Use Authorization (EUA). This EUA will remain in effect (meaning this test can be used) for the duration of the COVID-19 declaration under Section 564(b)(1) of the Act, 21 U.S.C. section 360bbb-3(b)(1), unless the authorization is terminated or revoked.  Performed at [facility], [address], [place], [place] [address]   Blood culture (routine x 2)     Status: None (Preliminary result)   Collection Time: 02/11/20  3:42 PM   Specimen: Right Antecubital; Blood  Result Value Ref Range Status   Specimen Description   Final    RIGHT ANTECUBITAL BOTTLES DRAWN AEROBIC AND ANAEROBIC   Special Requests Blood Culture adequate volume  Final   Culture   Final    NO GROWTH 2 DAYS Performed at [facility], [address] [place], [place] [address]    Report Status PENDING  Incomplete  Blood culture (routine x 2)     Status: None (Preliminary result)   Collection Time: 02/11/20  3:46 PM   Specimen: BLOOD RIGHT HAND  Result Value Ref Range Status   Specimen Description BLOOD RIGHT HAND BOTTLES DRAWN AEROBIC ONLY  Final   Special Requests   Final    Blood Culture results may not be optimal due to an inadequate volume of blood received in culture bottles   Culture   Final    NO GROWTH 2 DAYS Performed at [facility], [address] [place], [place] [address]    Report Status PENDING  Incomplete  SARS CORONAVIRUS 2 (TAT 6-24 HRS) Nasopharyngeal Nasopharyngeal Swab     Status: None   Collection Time: 02/12/20 11:16 AM   Specimen: Nasopharyngeal Swab  Result Value Ref Range Status   SARS Coronavirus 2 NEGATIVE NEGATIVE Final    Comment: (NOTE) SARS-CoV-2 target nucleic acids are NOT DETECTED.  The SARS-CoV-2 RNA is generally detectable in upper and lower respiratory specimens during the acute phase of infection. Negative results do not preclude  SARS-CoV-2 infection, do not rule out co-infections with other pathogens, and should not be used as the sole basis for treatment or other patient management decisions. Negative results must be combined with clinical observations, patient history, and epidemiological information. The expected result is Negative.  Fact Sheet for Patients: HairSlick.no  Fact Sheet for Healthcare Providers: quierodirigir.com  This test is not yet approved or cleared by the [place] FDA  and  has been authorized for detection and/or diagnosis of SARS-CoV-2 by FDA under an Emergency Use Authorization (EUA). This EUA will remain  in effect (meaning this test can be used) for the duration of the COVID-19 declaration under Se ction 564(b)(1) of the Act, 21 U.S.C. section 360bbb-3(b)(1), unless the authorization is terminated or revoked sooner.  Performed at [facility], [address], [place], [place] [address]   MRSA PCR Screening     Status: None   Collection Time: 02/12/20  7:40 PM   Specimen: Nasopharyngeal  Result Value Ref Range Status   MRSA by PCR NEGATIVE NEGATIVE Final    Comment:        The GeneXpert MRSA Assay (FDA approved for NASAL specimens only), is one component of a comprehensive MRSA colonization surveillance program. It is not intended to diagnose MRSA infection nor to guide or monitor treatment for MRSA infections. Performed at [facility], [address], [place], [place] [address]      Radiology Studies: DG Chest 2 View  Result Date: 02/11/2020 CLINICAL DATA:  Altered mental status. EXAM: CHEST - 2 VIEW COMPARISON:  Chest radiograph 06/30/2020. FINDINGS: Stable cardiac and mediastinal contours. Low lung volumes. Bibasilar atelectasis. No large area pulmonary consolidation. No pleural effusion or pneumothorax. IMPRESSION: No active cardiopulmonary disease. Electronically Signed   By: Annia Belt M.D.   On:  02/11/2020 15:01   CT Head Wo Contrast  Result Date: 02/11/2020 CLINICAL DATA:  Altered level of consciousness, decreased appetite, lethargy EXAM: CT HEAD WITHOUT CONTRAST TECHNIQUE: Contiguous axial images were obtained from the base of the skull through the vertex without intravenous contrast. COMPARISON:  01/31/2020 FINDINGS: Brain: Stable confluent hypodensities throughout the periventricular and subcortical white matter consistent with chronic small vessel ischemic change. No sign of acute infarct or hemorrhage. Lateral ventricles and midline structures are stable. No acute extra-axial fluid collections. No mass effect. Vascular: No hyperdense vessel or unexpected calcification. Skull: Normal. Negative for fracture or focal lesion. Sinuses/Orbits: No acute finding. Other: None. IMPRESSION: 1. Stable chronic small vessel ischemic changes. No acute intracranial process. Electronically Signed   By: Sharlet Salina M.D.   On: 02/11/2020 14:56   CT ANGIO CHEST PE W OR WO CONTRAST  Result Date: 02/11/2020 CLINICAL DATA:  87 year old male with history of lethargy. Evaluate for pulmonary embolism. EXAM: CT ANGIOGRAPHY CHEST WITH CONTRAST TECHNIQUE: Multidetector CT imaging of the chest was performed using the standard protocol during bolus administration of intravenous contrast. Multiplanar CT image reconstructions and MIPs were obtained to evaluate the vascular anatomy. CONTRAST:  75mL OMNIPAQUE IOHEXOL 350 MG/ML SOLN COMPARISON:  No priors. FINDINGS: Cardiovascular: There are numerous filling defects within the distal right main, lobar, segmental and subsegmental sized branches of the pulmonary arteries to the right lung, indicative of pulmonary embolism. No definite left-sided filling defects are noted. Heart size is normal. There is no significant pericardial fluid, thickening or pericardial calcification. There is aortic atherosclerosis, as well as atherosclerosis of the great vessels of the mediastinum and  the coronary arteries, including calcified atherosclerotic plaque in the left anterior descending and right coronary arteries. Mediastinum/Nodes: No pathologically enlarged mediastinal or hilar lymph nodes. Please note that accurate exclusion of hilar adenopathy is limited on noncontrast CT scans. Esophagus is unremarkable in appearance. No axillary lymphadenopathy. Lungs/Pleura: There is a small amount of dependent airspace consolidation in the right lower lobe. Lungs are otherwise clear. No suspicious appearing pulmonary nodules or masses are noted. No pleural effusions. Upper Abdomen: Aortic  atherosclerosis. Musculoskeletal: There are no aggressive appearing lytic or blastic lesions noted in the visualized portions of the skeleton. Review of the MIP images confirms the above findings. IMPRESSION: 1. Study is positive for pulmonary embolism in the right lung. In addition, there is some airspace consolidation in the right lower lobe which may represent alveolar hemorrhage from pulmonary infarction. 2. Aortic atherosclerosis, in addition to 2 vessel coronary artery disease. Please note that although the presence of coronary artery calcium documents the presence of coronary artery disease, the severity of this disease and any potential stenosis cannot be assessed on this non-gated CT examination. Assessment for potential risk factor modification, dietary therapy or pharmacologic therapy may be warranted, if clinically indicated. Critical Value/emergent results were called by telephone at the time of interpretation on 02/11/2020 at 5:50 pm to provider ABIGAIL HARRIS , who verbally acknowledged these results. Aortic Atherosclerosis (ICD10-I70.0). Electronically Signed   By: Trudie Reed M.D.   On: 02/11/2020 17:51   US Venous Img Lower Bilateral (DVT)  Result Date: 02/12/2020 CLINICAL DATA:  History of pulmonary emboli EXAM: BILATERAL LOWER EXTREMITY VENOUS DOPPLER ULTRASOUND TECHNIQUE: Gray-scale sonography  with graded compression, as well as color Doppler and duplex ultrasound were performed to evaluate the lower extremity deep venous systems from the level of the common femoral vein and including the common femoral, femoral, profunda femoral, popliteal and calf veins including the posterior tibial, peroneal and gastrocnemius veins when visible. The superficial great saphenous vein was also interrogated. Spectral Doppler was utilized to evaluate flow at rest and with distal augmentation maneuvers in the common femoral, femoral and popliteal veins. The left lower leg was not evaluated due to the combative nature of the patient. COMPARISON:  None. FINDINGS: RIGHT LOWER EXTREMITY Common Femoral Vein: No evidence of thrombus. Normal compressibility, respiratory phasicity and response to augmentation. Saphenofemoral Junction: No evidence of thrombus. Normal compressibility and flow on color Doppler imaging. Profunda Femoral Vein: No evidence of thrombus. Normal compressibility and flow on color Doppler imaging. Femoral Vein: No evidence of thrombus. Normal compressibility, respiratory phasicity and response to augmentation. Popliteal Vein: No evidence of thrombus. Normal compressibility, respiratory phasicity and response to augmentation. Calf Veins: No evidence of thrombus. Normal compressibility and flow on color Doppler imaging. Superficial Great Saphenous Vein: No evidence of thrombus. Normal compressibility. Venous Reflux:  None. Other Findings:  None. LEFT LOWER EXTREMITY Common Femoral Vein: No evidence of thrombus. Normal compressibility, respiratory phasicity and response to augmentation. Saphenofemoral Junction: No evidence of thrombus. Normal compressibility and flow on color Doppler imaging. Profunda Femoral Vein: No evidence of thrombus. Normal compressibility and flow on color Doppler imaging. Femoral Vein: No evidence of thrombus. Normal compressibility, respiratory phasicity and response to augmentation.  Popliteal Vein: Not evaluated Calf Veins: Not evaluate Superficial Great Saphenous Vein: Not evaluate Venous Reflux:  None. Other Findings:  None. IMPRESSION: No evidence of deep venous thrombosis is noted. The left lower leg was incompletely evaluated due to the patient's combative nature. Electronically Signed   By: Alcide Clever M.D.   On: 02/12/2020 10:08   ECHOCARDIOGRAM COMPLETE  Result Date: 02/13/2020    ECHOCARDIOGRAM REPORT   Patient Name:   Ronnie Gregory Date of Exam: 02/13/2020 Medical Rec #:  [id number]         Height:       67.0 in Accession #:    [id number]        Weight:       187.4 lb Date of Birth:  July 29, 1933  BSA:          1.967 m Patient Age:    86 years          BP:           135/77 mmHg Patient Gender: M                 HR:           80 bpm. [facility] Procedure: 2D Echo Indications:    Pulmonary Embolus 415.19 / I26.99  History:        Patient has no prior history of Echocardiogram examinations.                 CAD; Risk Factors:Dyslipidemia, Former Smoker and Diabetes.                 Dementia, GERD, Pulmonary Embolus, H/o COVID-19 virus infection.  Sonographer:    Jeryl Columbia RDCS (AE) Referring Phys: [id number] [facility] IMPRESSIONS  1. Left ventricular ejection fraction, by estimation, is 60 to 65%. The left ventricle has normal function. The left ventricle has no regional wall motion abnormalities. There is mild left ventricular hypertrophy. Left ventricular diastolic parameters were normal.  2. Right ventricular systolic function is normal. The right ventricular size is normal.  3. Left atrial size was mildly dilated.  4. The mitral valve is normal in structure. No evidence of mitral valve regurgitation. No evidence of mitral stenosis.  5. The aortic valve is tricuspid. Aortic valve regurgitation is not visualized. No aortic stenosis is present.  6. The inferior vena cava is normal in size with greater than 50% respiratory variability, suggesting  right atrial pressure of 3 mmHg. FINDINGS  Left Ventricle: Left ventricular ejection fraction, by estimation, is 60 to 65%. The left ventricle has normal function. The left ventricle has no regional wall motion abnormalities. The left ventricular internal cavity size was normal in size. There is  mild left ventricular hypertrophy. Left ventricular diastolic parameters were normal. Right Ventricle: The right ventricular size is normal. No increase in right ventricular wall thickness. Right ventricular systolic function is normal. Left Atrium: Left atrial size was mildly dilated. Right Atrium: Right atrial size was normal in size. Pericardium: There is no evidence of pericardial effusion. Mitral Valve: The mitral valve is normal in structure. No evidence of mitral valve regurgitation. No evidence of mitral valve stenosis. Tricuspid Valve: The tricuspid valve is normal in structure. Tricuspid valve regurgitation is not demonstrated. No evidence of tricuspid stenosis. Aortic Valve: The aortic valve is tricuspid. Aortic valve regurgitation is not visualized. No aortic stenosis is present. Aortic valve mean gradient measures 3.6 mmHg. Aortic valve peak gradient measures 6.9 mmHg. Aortic valve area, by VTI measures 3.26 cm. Pulmonic Valve: The pulmonic valve was not well visualized. Pulmonic valve regurgitation is not visualized. No evidence of pulmonic stenosis. Aorta: The aortic root is normal in size and structure. Venous: The inferior vena cava is normal in size with greater than 50% respiratory variability, suggesting right atrial pressure of 3 mmHg. IAS/Shunts: No atrial level shunt detected by color flow Doppler.  LEFT VENTRICLE PLAX 2D LVIDd:         3.70 cm  Diastology LVIDs:         1.89 cm  LV e' medial:    6.09 cm/s LV PW:         1.39 cm  LV E/e' medial:  13.5 LV IVS:        1.28  cm  LV e' lateral:   8.16 cm/s LVOT diam:     2.00 cm  LV E/e' lateral: 10.0 LV SV:         82 LV SV Index:   42 LVOT Area:      3.14 cm  RIGHT VENTRICLE RV S prime:     9.46 cm/s TAPSE (M-mode): 2.0 cm LEFT ATRIUM             Index       RIGHT ATRIUM           Index LA diam:        3.10 cm 1.58 cm/m  RA Area:     13.30 cm LA Vol (A2C):   54.6 ml 27.76 ml/m RA Volume:   36.00 ml  18.30 ml/m LA Vol (A4C):   57.4 ml 29.18 ml/m LA Biplane Vol: 55.7 ml 28.32 ml/m  AORTIC VALVE AV Area (Vmax):    2.93 cm AV Area (Vmean):   2.90 cm AV Area (VTI):     3.26 cm AV Vmax:           131.18 cm/s AV Vmean:          89.201 cm/s AV VTI:            0.251 m AV Peak Grad:      6.9 mmHg AV Mean Grad:      3.6 mmHg LVOT Vmax:         122.52 cm/s LVOT Vmean:        82.204 cm/s LVOT VTI:          0.261 m LVOT/AV VTI ratio: 1.04  AORTA Ao Root diam: 3.50 cm MITRAL VALVE MV Area (PHT): 3.26 cm    SHUNTS MV Decel Time: 233 msec    Systemic VTI:  0.26 m MV E velocity: 82.00 cm/s  Systemic Diam: 2.00 cm MV A velocity: 87.00 cm/s MV E/A ratio:  0.94 Dina Rich MD Electronically signed by Dina Rich MD Signature Date/Time: 02/13/2020/11:02:04 AM    Final    Scheduled Meds: . aspirin EC  81 mg Oral Q breakfast  . Chlorhexidine Gluconate Cloth  6 each Topical Daily  . enoxaparin (LOVENOX) injection  90 mg Subcutaneous Q12H  . ferrous sulfate  325 mg Oral BID WC  . insulin aspart  0-5 Units Subcutaneous QHS  . insulin aspart  0-9 Units Subcutaneous TID WC  . levothyroxine  75 mcg Oral QAC breakfast  . LORazepam  0.5 mg Intravenous Once  . metoprolol tartrate  25 mg Oral BID  . pantoprazole  40 mg Oral Daily  . polyethylene glycol  17 g Oral Daily  . pravastatin  40 mg Oral QHS  . tamsulosin  0.4 mg Oral QPC supper   Continuous Infusions: . sodium chloride 75 mL/hr at 02/12/20 1552    LOS: 2 days   Time spent: 36 mins   Hughie Closs, MD How to contact the [facility] Attending or Consulting provider 7A - 7P or covering provider during after hours 7P -7A, for this patient?  1. Check the care team in [facility] and look for a) attending/consulting  [facility] provider listed and b) the [facility] team listed 2. Log into [email or web address] and use [place]'s universal password to access. If you do not have the password, please contact the hospital operator. 3. Locate the [facility] provider you are looking for under [facility] and page to a number that you can be directly reached. 4. If you still  have difficulty reaching the provider, please page the [facility] (Director on Call) for the Hospitalists listed on amion for assistance.  02/13/2020, 11:08 AM

## 2020-02-13 NOTE — Evaluation (Signed)
Physical Therapy Evaluation Patient Details Name: Ronnie Gregory MRN: [id number] DOB: September 16, 1933 Today's Date: 02/13/2020   History of Present Illness  Ronnie Gregory is a 87 y.o. male with medical history significant for coronary artery disease, hypertension, DM, hypothyroidism.  Patient was brought to the ED from [facility] nursing home reports of altered mental status, and poor appetite.  At time of my evaluation, patient is lethargic, opens eyes to voice, verbalizing but not answering questions, hence history is obtained from chart review and from talking to nursing home staff. Per staff since patient was admitted there last week, he has had a flat affect, not responding much and not eating, so they do not know what his baseline is. No vomiting or loose stools. He has had a poor appetite.    Clinical Impression  Patient c/o severe pain with movement to BLE requiring Mod/max assist to roll to side and sit up from side lying position, unable to complete sit to stand due to BLE weakness and apprehension due to fear of falling.  Patient put back to bed with Max/total assist to reposition.  Patient on room air throughout visit with SpO2 at 97-98%, patient left on room air - RN notified.  Patient will benefit from continued physical therapy in hospital and recommended venue below to increase strength, balance, endurance for safe ADLs and gait.    Follow Up Recommendations SNF    Equipment Recommendations  None recommended by PT    Recommendations for Other Services       Precautions / Restrictions Precautions Precautions: Fall Restrictions Weight Bearing Restrictions: No      Mobility  Bed Mobility Overal bed mobility: Needs Assistance Bed Mobility: Rolling;Sidelying to Sit;Sit to Sidelying Rolling: Max assist Sidelying to sit: Max assist     Sit to sidelying: Mod assist;Max assist General bed mobility comments: Patient very stiff with c/o pain with bilateral knee flexion,  poor carryover for holding onto bed rail during sidelying to sitting    Transfers                    Ambulation/Gait                Stairs            Wheelchair Mobility    Modified Rankin (Stroke Patients Only)       Balance Overall balance assessment: Needs assistance Sitting-balance support: Feet supported;No upper extremity supported Sitting balance-Leahy Scale: Fair Sitting balance - Comments: fair/good seated at EOB                                     Pertinent Vitals/Pain Pain Assessment: Faces Faces Pain Scale: Hurts little more Pain Location: with movement of BLE, pressure to feet Pain Descriptors / Indicators: Grimacing;Guarding;Sore Pain Intervention(s): Limited activity within patient's tolerance;Monitored during session;Repositioned    Home Living Family/patient expects to be discharged to:: Private residence Living Arrangements: Alone Available Help at Discharge: Family;Available 24 hours/day Type of Home: House Home Access: Level entry     Home Layout: Able to live on main level with bedroom/bathroom;Two level Home Equipment: Walker - 2 wheels;Cane - single point;Bedside commode;Wheelchair - manual;Tub bench;Hand held shower head;Adaptive equipment Additional Comments: Patient is poor historian, info taken from previous admission    Prior Function Level of Independence: Needs assistance   Gait / Transfers Assistance Needed: Patient is poor historian  Hand Dominance   Dominant Hand: Left    Extremity/Trunk Assessment   Upper Extremity Assessment Upper Extremity Assessment: Generalized weakness    Lower Extremity Assessment Lower Extremity Assessment: Generalized weakness    Cervical / Trunk Assessment Cervical / Trunk Assessment: Normal  Communication   Communication: Other (comment)  Cognition Arousal/Alertness: Awake/alert Behavior During Therapy: Flat affect Overall Cognitive Status:  No family/caregiver present to determine baseline cognitive functioning                                        General Comments      Exercises     Assessment/Plan    PT Assessment Patient needs continued PT services  PT Problem List Decreased strength;Decreased activity tolerance;Decreased balance;Decreased mobility       PT Treatment Interventions Balance training;DME instruction;Gait training;Stair training;Functional mobility training;Therapeutic activities;Therapeutic exercise;Patient/family education    PT Goals (Current goals can be found in the Care Plan section)  Acute Rehab PT Goals Patient Stated Goal: not stated PT Goal Formulation: With patient Time For Goal Achievement: 02/27/20 Potential to Achieve Goals: Fair    Frequency Min 3X/week   Barriers to discharge        Co-evaluation               AM-PAC PT "6 Clicks" Mobility  Outcome Measure Help needed turning from your back to your side while in a flat bed without using bedrails?: A Lot Help needed moving from lying on your back to sitting on the side of a flat bed without using bedrails?: A Lot Help needed moving to and from a bed to a chair (including a wheelchair)?: Total Help needed standing up from a chair using your arms (e.g., wheelchair or bedside chair)?: Total Help needed to walk in hospital room?: Total Help needed climbing 3-5 steps with a railing? : Total 6 Click Score: 8    End of Session Equipment Utilized During Treatment: Gait belt Activity Tolerance: Patient tolerated treatment well;Patient limited by fatigue Patient left: in bed;with call bell/phone within reach Nurse Communication: Mobility status PT Visit Diagnosis: Unsteadiness on feet (R26.81);Other abnormalities of gait and mobility (R26.89);Muscle weakness (generalized) (M62.81)    Time: 1751-0258 PT Time Calculation (min) (ACUTE ONLY): 28 min   Charges:   PT Evaluation $PT Eval Moderate Complexity: 1  Mod PT Treatments $Therapeutic Activity: 23-37 mins        1:56 PM, 02/13/20 Ocie Bob, MPT Physical Therapist with [facility] [phone] office [phone] mobile phone

## 2020-02-13 NOTE — Plan of Care (Signed)
  Problem: Acute Rehab PT Goals(only PT should resolve) Goal: Pt will Roll Supine to Side Outcome: Progressing Flowsheets (Taken 02/13/2020 1358) Pt will Roll Supine to Side:  with mod assist  with min assist Goal: Pt Will Go Supine/Side To Sit Outcome: Progressing Flowsheets (Taken 02/13/2020 1358) Pt will go Supine/Side to Sit:  with minimal assist  with moderate assist Goal: Pt Will Go Sit To Supine/Side Outcome: Progressing Flowsheets (Taken 02/13/2020 1358) Pt will go Sit to Supine/Side:  with minimal assist  with moderate assist Goal: Patient Will Transfer Sit To/From Stand Outcome: Progressing Flowsheets (Taken 02/13/2020 1358) Patient will transfer sit to/from stand: with moderate assist Goal: Pt Will Transfer Bed To Chair/Chair To Bed Outcome: Progressing Flowsheets (Taken 02/13/2020 1358) Pt will Transfer Bed to Chair/Chair to Bed: with mod assist Goal: Pt Will Ambulate Outcome: Progressing Flowsheets (Taken 02/13/2020 1358) Pt will Ambulate:  10 feet  with moderate assist  with rolling walker   1:58 PM, 02/13/20 Ronnie Gregory, MPT Physical Therapist with [facility] [phone] office [phone] mobile phone

## 2020-02-13 NOTE — Plan of Care (Signed)

## 2020-02-13 NOTE — Progress Notes (Signed)
*  PRELIMINARY RESULTS* Echocardiogram 2D Echocardiogram has been performed.  Ronnie Gregory 02/13/2020, 9:57 AM

## 2020-02-14 LAB — CBC
HCT: 26 % — ABNORMAL LOW (ref 39.0–52.0)
Hemoglobin: 8 g/dL — ABNORMAL LOW (ref 13.0–17.0)
MCH: 30.2 pg (ref 26.0–34.0)
MCHC: 30.8 g/dL (ref 30.0–36.0)
MCV: 98.1 fL (ref 80.0–100.0)
Platelets: 468 10*3/uL — ABNORMAL HIGH (ref 150–400)
RBC: 2.65 MIL/uL — ABNORMAL LOW (ref 4.22–5.81)
RDW: 13.3 % (ref 11.5–15.5)
WBC: 6.7 10*3/uL (ref 4.0–10.5)
nRBC: 0 % (ref 0.0–0.2)

## 2020-02-14 LAB — GLUCOSE, CAPILLARY
Glucose-Capillary: 106 mg/dL — ABNORMAL HIGH (ref 70–99)
Glucose-Capillary: 187 mg/dL — ABNORMAL HIGH (ref 70–99)
Glucose-Capillary: 117 mg/dL — ABNORMAL HIGH (ref 70–99)
Glucose-Capillary: 102 mg/dL — ABNORMAL HIGH (ref 70–99)

## 2020-02-14 MED ORDER — VITAMIN B-12 1000 MCG PO TABS
1000.0000 ug | ORAL_TABLET | Freq: Every day | ORAL | Status: DC
  Administered 2020-02-14 – 2020-02-19 (×5): 1000 ug via ORAL
  Filled 2020-02-14 (×6): qty 1

## 2020-02-14 MED ORDER — SODIUM CHLORIDE 0.9 % IV SOLN
510.0000 mg | Freq: Once | INTRAVENOUS | Status: AC
  Administered 2020-02-14: 510 mg via INTRAVENOUS
  Filled 2020-02-14: qty 17

## 2020-02-14 MED ORDER — APIXABAN 5 MG PO TABS
10.0000 mg | ORAL_TABLET | Freq: Two times a day (BID) | ORAL | Status: DC
  Administered 2020-02-14 – 2020-02-15 (×3): 10 mg via ORAL
  Filled 2020-02-14 (×4): qty 2

## 2020-02-14 MED ORDER — POTASSIUM CHLORIDE CRYS ER 20 MEQ PO TBCR
40.0000 meq | EXTENDED_RELEASE_TABLET | Freq: Once | ORAL | Status: AC
  Administered 2020-02-14: 40 meq via ORAL
  Filled 2020-02-14: qty 2

## 2020-02-14 MED ORDER — APIXABAN 5 MG PO TABS: 5.0000 mg | ORAL_TABLET | Freq: Two times a day (BID) | ORAL | Status: DC

## 2020-02-14 NOTE — TOC Progression Note (Signed)
Transition of Care [facility]) - Progression Note   Patient Details  Name: Ronnie Gregory MRN: [id number] Date of Birth: [birth year]  [facility] CM/SW Contact  Ewing Schlein, LCSW Phone Number: 02/14/2020, 10:03 AM  Clinical Narrative: Patient anticipated to discharge back to [facility] tomorrow for SNF. CSW started new insurance authorization with [employer]. Reference ID # is: [id number].  Expected Discharge Plan: Skilled Nursing Facility Barriers to Discharge: Continued Medical Work up  Expected Discharge Plan and Services Expected Discharge Plan: Skilled Nursing Facility In-house Referral: Clinical Social Work Post Acute Care Choice: Skilled Nursing Facility Living arrangements for the past 2 months: Single Family Home,Skilled Nursing Facility DME Agency: NA  Readmission Risk Interventions Readmission Risk Prevention Plan 02/02/2020  Medication Screening Complete  Transportation Screening Complete  Some recent data might be hidden

## 2020-02-14 NOTE — Progress Notes (Signed)
OT Cancellation Note  Patient Details Name: Ronnie Gregory MRN: [id number] DOB: October 03, 1933   Cancelled Treatment:    Reason Eval/Treat Not Completed: Medical issues which prohibited therapy;Other (comment). Pt not seen this date due to inability to follow 1 step commands or answer orientation questions. Pt repeatedly reported "I can't take no more shots." Will check back on pt tomorrow and evaluate if cognition approves and he is appropriate for rehab services.    Zen Cedillos OT, MOT  Danie Chandler 02/14/2020, 7:56 AM

## 2020-02-14 NOTE — Progress Notes (Signed)
PROGRESS NOTE   Ronnie Gregory  MVH:[id number] DOB: 04/16/1933 DOA: 02/11/2020 PCP: Ignatius Specking, MD   Chief Complaint  Patient presents with  . Failure To Thrive   Level of care: Telemetry   Brief Admission History:  87 y.o. male with medical history significant for coronary artery disease, hypertension, DM, hypothyroidism. Patient was brought to the ED from [facility] nursing home reports of altered mental status, and poor appetite.   He has been at [facility] but with failure to thrive daughter had him sent to ED where he was found to have a right lung PE with pulmonary infarction.  PCCM recommended keeping at [facility] and providing IV heparin infusion.    Assessment & Plan:   Principal Problem:   Pulmonary embolus (HCC) Active Problems:   S/P coronary artery stent placement   Acute metabolic encephalopathy    Hyperlipidemia due to type 2 diabetes mellitus (HCC)   Vascular dementia without behavioral disturbance (HCC)    Acute respiratory failure with hypoxia secondary to acute right-sided pulmonary Embolus  - Lovenox full dose- transition to PO eliquis as patient wants to avoid shots -weaned off O2 -echo: EF 60-65%  Lactic acidosis  - no infection found - treating with IV fluids  CAD  - stable  Chronic urinary retention  - continue chronic indwelling foley - started on tamsulosin.   -Outpatient urology follow up recommended.   Type 2 diabetes mellitus  -uncontrolled as evidenced by a1c 7.3%  -continue SSI coverage   Hypothyroidism -continue home levothyroxine.    Dementia - with precipitous decline in last month since COVID and now admitted again with a pulmonary embolus. -delirium precautions -Palliative care consult   DVT prophylaxis: eliquis Code Status: full  Family Communication: spoke with daughter Disposition:  Status is: Inpatient  Remains inpatient appropriate because:IV treatments appropriate due to intensity of illness or inability to  take PO and Inpatient level of care appropriate due to severity of illness   Dispo: The patient is from: SNF              Anticipated d/c is to: SNF              Anticipated d/c date is: 1- 2 days              Patient currently is not medically stable to d/c.   Difficult to place patient No   Consultants:   Palliative medicine    Subjective: Does not like getting shots  Objective: Vitals:   02/13/20 1333 02/13/20 2112 02/14/20 0556 02/14/20 0745  BP: 124/64 (!) 155/68 139/75 (!) 142/83  Pulse: 79 83 100 100  Resp: 16 19 17  (!) 28  Temp: (!) 97.4 F (36.3 C) 99.8 F (37.7 C) 99.8 F (37.7 C) (!) 97.5 F (36.4 C)  TempSrc: Oral Oral Oral Oral  SpO2: 95% 93% 92% 93%  Weight:      Height:        Intake/Output Summary (Last 24 hours) at 02/14/2020 1210 Last data filed at 02/14/2020 0900 Gross per 24 hour  Intake 2891 ml  Output 500 ml  Net 2391 ml   Filed Weights   02/11/20 1333 02/11/20 1334 02/12/20 1851  Weight: 88.9 kg 88.9 kg 85 kg    Examination:   General: Appearance:     Overweight male in no acute distress     Lungs:     Poor effort, respirations unlabored  Heart:    Tachycardic. Normal rhythm. No murmurs,  rubs, or gallops.      Neurologic:   Hard of hearing    Data Reviewed: I have personally reviewed following labs and imaging studies  CBC: Recent Labs  Lab 02/09/20 0430 02/11/20 1542 02/12/20 0650 02/13/20 0534 02/14/20 0611  WBC 8.4 9.4 6.8 6.0 6.7  NEUTROABS 5.7 6.5  --   --   --   HGB 10.3* 10.9* 8.6* 8.2* 8.0*  HCT 32.4* 34.2* 27.8* 26.0* 26.0*  MCV 97.3 98.8 98.9 97.7 98.1  PLT 466* 643* 480* 529* 468*    Basic Metabolic Panel: Recent Labs  Lab 02/09/20 0430 02/11/20 1542 02/13/20 1134  NA 137 133* 133*  K 3.4* 3.6 3.2*  CL 100 99 105  CO2 22 23 21*  GLUCOSE 113* 135* 114*  BUN 21 21 12   CREATININE 1.20 1.20 0.99  CALCIUM 8.5* 8.8* 7.8*    GFR: Estimated Creatinine Clearance: 57.9 mL/min (by C-G formula based on  SCr of 0.99 mg/dL).  Liver Function Tests: Recent Labs  Lab 02/09/20 0430 02/11/20 1542  AST 26 29  ALT 39 37  ALKPHOS 93 110  BILITOT 0.2* 0.4  PROT 6.1* 6.5  ALBUMIN 2.3* 2.5*    CBG: Recent Labs  Lab 02/13/20 1109 02/13/20 1626 02/13/20 2111 02/14/20 0809 02/14/20 1112  GLUCAP 109* 119* 102* 106* 187*    Recent Results (from the past 240 hour(s))  Resp Panel by RT-PCR (Flu A&B, Covid) Nasopharyngeal Swab     Status: None   Collection Time: 02/05/20 11:42 AM   Specimen: Nasopharyngeal Swab; Nasopharyngeal(NP) swabs in vial transport medium  Result Value Ref Range Status   SARS Coronavirus 2 by RT PCR NEGATIVE NEGATIVE Final    Comment: (NOTE) SARS-CoV-2 target nucleic acids are NOT DETECTED.  The SARS-CoV-2 RNA is generally detectable in upper respiratory specimens during the acute phase of infection. The lowest concentration of SARS-CoV-2 viral copies this assay can detect is 138 copies/mL. A negative result does not preclude SARS-Cov-2 infection and should not be used as the sole basis for treatment or other patient management decisions. A negative result may occur with  improper specimen collection/handling, submission of specimen other than nasopharyngeal swab, presence of viral mutation(s) within the areas targeted by this assay, and inadequate number of viral copies(<138 copies/mL). A negative result must be combined with clinical observations, patient history, and epidemiological information. The expected result is Negative.  Fact Sheet for Patients:  BloggerCourse.com  Fact Sheet for Healthcare Providers:  SeriousBroker.it  This test is no t yet approved or cleared by the [place] FDA and  has been authorized for detection and/or diagnosis of SARS-CoV-2 by FDA under an Emergency Use Authorization (EUA). This EUA will remain  in effect (meaning this test can be used) for the duration of  the COVID-19 declaration under Section 564(b)(1) of the Act, 21 U.S.C.section 360bbb-3(b)(1), unless the authorization is terminated  or revoked sooner.       Influenza A by PCR NEGATIVE NEGATIVE Final   Influenza B by PCR NEGATIVE NEGATIVE Final    Comment: (NOTE) The Xpert Xpress SARS-CoV-2/FLU/RSV plus assay is intended as an aid in the diagnosis of influenza from Nasopharyngeal swab specimens and should not be used as a sole basis for treatment. Nasal washings and aspirates are unacceptable for Xpert Xpress SARS-CoV-2/FLU/RSV testing.  Fact Sheet for Patients: BloggerCourse.com  Fact Sheet for Healthcare Providers: SeriousBroker.it  This test is not yet approved or cleared by the [place] FDA and has been authorized for detection  and/or diagnosis of SARS-CoV-2 by FDA under an Emergency Use Authorization (EUA). This EUA will remain in effect (meaning this test can be used) for the duration of the COVID-19 declaration under Section 564(b)(1) of the Act, 21 U.S.C. section 360bbb-3(b)(1), unless the authorization is terminated or revoked.  Performed at [facility], [address] [place], [place] [address]   Blood culture (routine x 2)     Status: None (Preliminary result)   Collection Time: 02/11/20  3:42 PM   Specimen: Right Antecubital; Blood  Result Value Ref Range Status   Specimen Description   Final    RIGHT ANTECUBITAL BOTTLES DRAWN AEROBIC AND ANAEROBIC   Special Requests Blood Culture adequate volume  Final   Culture   Final    NO GROWTH 3 DAYS Performed at [facility], [address], [place], [place] [address]    Report Status PENDING  Incomplete  Blood culture (routine x 2)     Status: None (Preliminary result)   Collection Time: 02/11/20  3:46 PM   Specimen: BLOOD RIGHT HAND  Result Value Ref Range Status   Specimen Description BLOOD RIGHT HAND BOTTLES DRAWN AEROBIC ONLY  Final   Special Requests    Final    Blood Culture results may not be optimal due to an inadequate volume of blood received in culture bottles   Culture   Final    NO GROWTH 3 DAYS Performed at [facility], [address], [place], [place] [address]    Report Status PENDING  Incomplete  SARS CORONAVIRUS 2 (TAT 6-24 HRS) Nasopharyngeal Nasopharyngeal Swab     Status: None   Collection Time: 02/12/20 11:16 AM   Specimen: Nasopharyngeal Swab  Result Value Ref Range Status   SARS Coronavirus 2 NEGATIVE NEGATIVE Final    Comment: (NOTE) SARS-CoV-2 target nucleic acids are NOT DETECTED.  The SARS-CoV-2 RNA is generally detectable in upper and lower respiratory specimens during the acute phase of infection. Negative results do not preclude SARS-CoV-2 infection, do not rule out co-infections with other pathogens, and should not be used as the sole basis for treatment or other patient management decisions. Negative results must be combined with clinical observations, patient history, and epidemiological information. The expected result is Negative.  Fact Sheet for Patients: HairSlick.no  Fact Sheet for Healthcare Providers: quierodirigir.com  This test is not yet approved or cleared by the [place] FDA and  has been authorized for detection and/or diagnosis of SARS-CoV-2 by FDA under an Emergency Use Authorization (EUA). This EUA will remain  in effect (meaning this test can be used) for the duration of the COVID-19 declaration under Se ction 564(b)(1) of the Act, 21 U.S.C. section 360bbb-3(b)(1), unless the authorization is terminated or revoked sooner.  Performed at [facility], [address], [place], [place] [address]   MRSA PCR Screening     Status: None   Collection Time: 02/12/20  7:40 PM   Specimen: Nasopharyngeal  Result Value Ref Range Status   MRSA by PCR NEGATIVE NEGATIVE Final    Comment:        The GeneXpert MRSA Assay  (FDA approved for NASAL specimens only), is one component of a comprehensive MRSA colonization surveillance program. It is not intended to diagnose MRSA infection nor to guide or monitor treatment for MRSA infections. Performed at [facility], [address] [place], [place] [address]      Radiology Studies: ECHOCARDIOGRAM COMPLETE  Result Date: 02/13/2020    ECHOCARDIOGRAM REPORT  Patient Name:   TILTON MARSALIS Date of Exam: 02/13/2020 Medical Rec #:  [id number]         Height:       67.0 in Accession #:    [id number]        Weight:       187.4 lb Date of Birth:  1933-04-22        BSA:          1.967 m Patient Age:    86 years          BP:           135/77 mmHg Patient Gender: M                 HR:           80 bpm. [facility]am [facility] Procedure: 2D Echo Indications:    Pulmonary Embolus 415.19 / I26.99  History:        Patient has no prior history of Echocardiogram examinations.                 CAD; Risk Factors:Dyslipidemia, Former Smoker and Diabetes.                 Dementia, GERD, Pulmonary Embolus, H/o COVID-19 virus infection.  Sonographer:    Jeryl Columbia RDCS (AE) Referring Phys: [id number] [facility] IMPRESSIONS  1. Left ventricular ejection fraction, by estimation, is 60 to 65%. The left ventricle has normal function. The left ventricle has no regional wall motion abnormalities. There is mild left ventricular hypertrophy. Left ventricular diastolic parameters were normal.  2. Right ventricular systolic function is normal. The right ventricular size is normal.  3. Left atrial size was mildly dilated.  4. The mitral valve is normal in structure. No evidence of mitral valve regurgitation. No evidence of mitral stenosis.  5. The aortic valve is tricuspid. Aortic valve regurgitation is not visualized. No aortic stenosis is present.  6. The inferior vena cava is normal in size with greater than 50% respiratory variability, suggesting right atrial pressure of 3 mmHg. FINDINGS   Left Ventricle: Left ventricular ejection fraction, by estimation, is 60 to 65%. The left ventricle has normal function. The left ventricle has no regional wall motion abnormalities. The left ventricular internal cavity size was normal in size. There is  mild left ventricular hypertrophy. Left ventricular diastolic parameters were normal. Right Ventricle: The right ventricular size is normal. No increase in right ventricular wall thickness. Right ventricular systolic function is normal. Left Atrium: Left atrial size was mildly dilated. Right Atrium: Right atrial size was normal in size. Pericardium: There is no evidence of pericardial effusion. Mitral Valve: The mitral valve is normal in structure. No evidence of mitral valve regurgitation. No evidence of mitral valve stenosis. Tricuspid Valve: The tricuspid valve is normal in structure. Tricuspid valve regurgitation is not demonstrated. No evidence of tricuspid stenosis. Aortic Valve: The aortic valve is tricuspid. Aortic valve regurgitation is not visualized. No aortic stenosis is present. Aortic valve mean gradient measures 3.6 mmHg. Aortic valve peak gradient measures 6.9 mmHg. Aortic valve area, by VTI measures 3.26 cm. Pulmonic Valve: The pulmonic valve was not well visualized. Pulmonic valve regurgitation is not visualized. No evidence of pulmonic stenosis. Aorta: The aortic root is normal in size and structure. Venous: The inferior vena cava is normal in size with greater than 50% respiratory variability, suggesting right atrial pressure of 3 mmHg. IAS/Shunts: No atrial level shunt detected by color flow  Doppler.  LEFT VENTRICLE PLAX 2D LVIDd:         3.70 cm  Diastology LVIDs:         1.89 cm  LV e' medial:    6.09 cm/s LV PW:         1.39 cm  LV E/e' medial:  13.5 LV IVS:        1.28 cm  LV e' lateral:   8.16 cm/s LVOT diam:     2.00 cm  LV E/e' lateral: 10.0 LV SV:         82 LV SV Index:   42 LVOT Area:     3.14 cm  RIGHT VENTRICLE RV S prime:     9.46  cm/s TAPSE (M-mode): 2.0 cm LEFT ATRIUM             Index       RIGHT ATRIUM           Index LA diam:        3.10 cm 1.58 cm/m  RA Area:     13.30 cm LA Vol (A2C):   54.6 ml 27.76 ml/m RA Volume:   36.00 ml  18.30 ml/m LA Vol (A4C):   57.4 ml 29.18 ml/m LA Biplane Vol: 55.7 ml 28.32 ml/m  AORTIC VALVE AV Area (Vmax):    2.93 cm AV Area (Vmean):   2.90 cm AV Area (VTI):     3.26 cm AV Vmax:           131.18 cm/s AV Vmean:          89.201 cm/s AV VTI:            0.251 m AV Peak Grad:      6.9 mmHg AV Mean Grad:      3.6 mmHg LVOT Vmax:         122.52 cm/s LVOT Vmean:        82.204 cm/s LVOT VTI:          0.261 m LVOT/AV VTI ratio: 1.04  AORTA Ao Root diam: 3.50 cm MITRAL VALVE MV Area (PHT): 3.26 cm    SHUNTS MV Decel Time: 233 msec    Systemic VTI:  0.26 m MV E velocity: 82.00 cm/s  Systemic Diam: 2.00 cm MV A velocity: 87.00 cm/s MV E/A ratio:  0.94 Dina RichJonathan Branch MD Electronically signed by Dina RichJonathan Branch MD Signature Date/Time: 02/13/2020/11:02:04 AM    Final    Scheduled Meds: . apixaban  10 mg Oral BID   Followed by  . [START ON 02/21/2020] apixaban  5 mg Oral BID  . aspirin EC  81 mg Oral Q breakfast  . Chlorhexidine Gluconate Cloth  6 each Topical Daily  . ferrous sulfate  325 mg Oral BID WC  . insulin aspart  0-5 Units Subcutaneous QHS  . insulin aspart  0-9 Units Subcutaneous TID WC  . levothyroxine  75 mcg Oral QAC breakfast  . metoprolol tartrate  25 mg Oral BID  . pantoprazole  40 mg Oral Daily  . polyethylene glycol  17 g Oral Daily  . pravastatin  40 mg Oral QHS  . tamsulosin  0.4 mg Oral QPC supper   Continuous Infusions:   LOS: 3 days   Time spent: 25 mins   Joseph ArtJessica U Kay Ricciuti, DO  How to contact the [facility] Attending or Consulting provider 7A - 7P or covering provider during after hours 7P -7A, for this patient?  1. Check the care team in [facility] and  look for a) attending/consulting TR[facility] provider listed and b) the [facility] team listed 2. Log into [email or web address] and use [place]'s  universal password to access. If you do not have the password, please contact the hospital operator. 3. Locate the [facility] provider you are looking for under [facility] and page to a number that you can be directly reached. 4. If you still have difficulty reaching the provider, please page the [facility] (Director on Call) for the Hospitalists listed on amion for assistance.  02/14/2020, 12:10 PM

## 2020-02-14 NOTE — Progress Notes (Signed)
ANTICOAGULATION CONSULT NOTE -  Pharmacy Consult for  Lovenox>> eliquis Indication: pulmonary embolus  Allergies  Allergen Reactions  . Hydrocodone     Other reaction(s): Confusion, Hallucinations  . Mobic [Meloxicam] Other (See Comments)    Mucus    Patient Measurements: Height: 5\' 7"  (170.2 cm) Weight: 85 kg (187 lb 6.3 oz) IBW/kg (Calculated) : 66.1  HEPARIN DW (KG): 83.3  Vital Signs: Temp: 97.5 F (36.4 C) (01/26 0745) Temp Source: Oral (01/26 0745) BP: 142/83 (01/26 0745) Pulse Rate: 100 (01/26 0745)  Labs: Recent Labs    02/11/20 1542 02/12/20 02/14/20 02/12/20 0650 02/13/20 0534 02/13/20 1134 02/14/20 0611  HGB 10.9*  --  8.6* 8.2*  --  8.0*  HCT 34.2*  --  27.8* 26.0*  --  26.0*  PLT 643*  --  480* 529*  --  468*  HEPARINUNFRC  --  0.84*  --   --   --   --   CREATININE 1.20  --   --   --  0.99  --     Estimated Creatinine Clearance: 57.9 mL/min (by C-G formula based on SCr of 0.99 mg/dL).   Medical History: Past Medical History:  Diagnosis Date  . Arthritis   . Chronic kidney disease   . Constipation    uses stool softener daily  . Foley catheter in place    urinary retention  . GERD (gastroesophageal reflux disease)   . Hypercholesteremia   . Hypertension   . Hypothyroidism   . Myocardial infarction [facility]) 2009   Dr. 2010 cardiologist 2 stents    Medications:  Medications Prior to Admission  Medication Sig Dispense Refill Last Dose  . acetaminophen (TYLENOL) 325 MG tablet Take 2 tablets (650 mg total) by mouth every 6 (six) hours as needed for mild pain or headache (or Fever >/= 101). 30 tablet 1 Past Week at Unknown time  . Amino Acids-Protein Hydrolys (FEEDING SUPPLEMENT, PRO-STAT SUGAR FREE 64,) LIQD Take 30 mLs by mouth 3 (three) times daily with meals.   02/11/2020 at Unknown time  . aspirin EC 81 MG tablet Take 1 tablet (81 mg total) by mouth daily with breakfast. 30 tablet 2 02/11/2020 at Unknown time  . citalopram (CELEXA) 20  MG tablet Take 20 mg by mouth daily.   02/11/2020 at Unknown time  . ferrous sulfate 325 (65 FE) MG EC tablet Take 1 tablet (325 mg total) by mouth 2 (two) times daily with a meal. 60 tablet 3 02/11/2020 at Unknown time  . insulin aspart (NOVOLOG FLEXPEN) 100 UNIT/ML FlexPen Inject 5 Units into the skin 3 (three) times daily with meals. Special instructions: If accu-check is >200 . Hold for accu-check 200 or less   02/11/2020 at Unknown time  . levothyroxine (SYNTHROID) 75 MCG tablet Take 1 tablet (75 mcg total) by mouth daily before breakfast. 30 tablet 1 02/11/2020 at Unknown time  . magnesium hydroxide (MILK OF MAGNESIA) 400 MG/5ML suspension Take 30 mLs by mouth once.   02/10/2020 at Unknown time  . metoprolol tartrate (LOPRESSOR) 25 MG tablet Take 1 tablet (25 mg total) by mouth 2 (two) times daily. 60 tablet 2 02/11/2020 at 0839  . ondansetron (ZOFRAN) 4 MG tablet Take 1 tablet (4 mg total) by mouth every 8 (eight) hours as needed for nausea. 20 tablet 0   . pantoprazole (PROTONIX) 40 MG tablet Take 1 tablet (40 mg total) by mouth daily. 30 tablet 3 02/11/2020 at Unknown time  . polyethylene glycol (MIRALAX /  GLYCOLAX) 17 g packet Take 17 g by mouth daily. 30 each 1 02/11/2020 at Unknown time  . pravastatin (PRAVACHOL) 40 MG tablet Take 1 tablet (40 mg total) by mouth at bedtime. 30 tablet 1 02/10/2020 at Unknown time  . tiZANidine (ZANAFLEX) 2 MG tablet Take 1 tablet (2 mg total) by mouth every 12 (twelve) hours as needed for muscle spasms (back pain). 15 tablet 1   . UNABLE TO FIND Diet: NAS & Consistent Carbohydrate      Scheduled:  . apixaban  10 mg Oral BID   Followed by  . [START ON 02/21/2020] apixaban  5 mg Oral BID  . aspirin EC  81 mg Oral Q breakfast  . Chlorhexidine Gluconate Cloth  6 each Topical Daily  . ferrous sulfate  325 mg Oral BID WC  . insulin aspart  0-5 Units Subcutaneous QHS  . insulin aspart  0-9 Units Subcutaneous TID WC  . levothyroxine  75 mcg Oral QAC breakfast  .  metoprolol tartrate  25 mg Oral BID  . pantoprazole  40 mg Oral Daily  . polyethylene glycol  17 g Oral Daily  . pravastatin  40 mg Oral QHS  . tamsulosin  0.4 mg Oral QPC supper   Infusions:   PRN:  Anti-infectives (From admission, onward)   None      Assessment: Ronnie Gregory a 87 y.o. male requires anticoagulation with a heparin iv infusion for the indication of  pulmonary embolus. Heparin gtt will be started following pharmacy protocol per pharmacy consult. Patient is not on previous oral anticoagulant that will require aPTT/HL correlation before transitioning to only HL monitoring.   1/26 Hgb 8.0, slightly down plt 468, no e/o bleeding Will transition to Eliquis for oral tx of PE  Goal of Therapy:  Anti-xa =  0.6-1 Monitor platelets by anticoagulation protocol: Yes   Plan:  Dc Lovenox  Eliquis 10mg  po bid x 7 days, then 5mg  po bid Educate on eliquis F/u with s/s of bleeding  , BS , Elder Cyphers Clinical Pharmacist Pager [phone] 02/14/2020 10:32 AM

## 2020-02-15 ENCOUNTER — Encounter: Payer: Self-pay | Admitting: Internal Medicine

## 2020-02-15 ENCOUNTER — Encounter: Payer: Self-pay | Admitting: Adult Health

## 2020-02-15 LAB — BASIC METABOLIC PANEL
Anion gap: 9 (ref 5–15)
BUN: 9 mg/dL (ref 8–23)
CO2: 19 mmol/L — ABNORMAL LOW (ref 22–32)
Calcium: 8.1 mg/dL — ABNORMAL LOW (ref 8.9–10.3)
Chloride: 104 mmol/L (ref 98–111)
Creatinine, Ser: 0.99 mg/dL (ref 0.61–1.24)
GFR, Estimated: >60 mL/min (ref >60)
Glucose, Bld: 109 mg/dL — ABNORMAL HIGH (ref 70–99)
Potassium: 3.4 mmol/L — ABNORMAL LOW (ref 3.5–5.1)
Sodium: 132 mmol/L — ABNORMAL LOW (ref 135–145)

## 2020-02-15 LAB — CBC
HCT: 27.5 % — ABNORMAL LOW (ref 39.0–52.0)
Hemoglobin: 8.7 g/dL — ABNORMAL LOW (ref 13.0–17.0)
MCH: 30.7 pg (ref 26.0–34.0)
MCHC: 31.6 g/dL (ref 30.0–36.0)
MCV: 97.2 fL (ref 80.0–100.0)
Platelets: 429 10*3/uL — ABNORMAL HIGH (ref 150–400)
RBC: 2.83 MIL/uL — ABNORMAL LOW (ref 4.22–5.81)
RDW: 13.4 % (ref 11.5–15.5)
WBC: 5.9 10*3/uL (ref 4.0–10.5)
nRBC: 0.3 % — ABNORMAL HIGH (ref 0.0–0.2)

## 2020-02-15 LAB — GLUCOSE, CAPILLARY
Glucose-Capillary: 101 mg/dL — ABNORMAL HIGH (ref 70–99)
Glucose-Capillary: 106 mg/dL — ABNORMAL HIGH (ref 70–99)
Glucose-Capillary: 130 mg/dL — ABNORMAL HIGH (ref 70–99)
Glucose-Capillary: 87 mg/dL (ref 70–99)

## 2020-02-15 MED ORDER — CITALOPRAM HYDROBROMIDE 20 MG PO TABS
20.0000 mg | ORAL_TABLET | Freq: Every day | ORAL | Status: DC
  Administered 2020-02-17 – 2020-02-19 (×3): 20 mg via ORAL
  Filled 2020-02-15 (×5): qty 1

## 2020-02-15 NOTE — Progress Notes (Signed)
Physical Therapy Treatment Patient Details Name: Ronnie Gregory MRN: [id number] DOB: [birth year] Today's Date: 02/15/2020    History of Present Illness Ronnie Gregory is a 87 y.o. male with medical history significant for coronary artery disease, hypertension, DM, hypothyroidism.  Patient was brought to the ED from [facility] nursing home reports of altered mental status, and poor appetite.  At time of my evaluation, patient is lethargic, opens eyes to voice, verbalizing but not answering questions, hence history is obtained from chart review and from talking to nursing home staff. Per staff since patient was admitted there last week, he has had a flat affect, not responding much and not eating, so they do not know what his baseline is. No vomiting or loose stools. He has had a poor appetite.    PT Comments    Pt only verbalization is , "That is enough".  PT resists therapist as she attempts to bring pt supine to sit which makes this transition impossible.   Follow Up Recommendations  SNF     Equipment Recommendations  None recommended by PT    Recommendations for Other Services       Precautions / Restrictions Precautions Precautions: Fall Restrictions Weight Bearing Restrictions: No    Mobility  Bed Mobility Overal bed mobility: Needs Assistance Bed Mobility: Rolling Rolling: Max assist Sidelying to sit: Max assist (PT resists motion unable to take pt to full sitting position) Supine to sit: Max assist        Transfers                          Cognition Arousal/Alertness: Awake/alert;Lethargic Behavior During Therapy: Flat affect Overall Cognitive Status: No family/caregiver present to determine baseline cognitive functioning                                 General Comments: PT will not verbalize any answer to any question that therapist asks of him      Exercises General Exercises - Upper Extremity Shoulder Flexion: PROM;5  reps;Both Elbow Flexion: PROM;5 reps;Both Elbow Extension: PROM;Both;5 reps Wrist Flexion: PROM;5 reps;Both General Exercises - Lower Extremity Ankle Circles/Pumps: PROM;Both;5 reps Heel Slides: PROM;Both;5 reps Hip ABduction/ADduction: PROM;Both;5 reps    General Comments        Pertinent Vitals/Pain Pain Assessment: Faces Faces Pain Scale: Hurts little more Pain Location: with movement of BLE, pressure to feet Pain Descriptors / Indicators: Grimacing;Guarding;Sore Pain Intervention(s): Limited activity within patient's tolerance;Repositioned    Home Living Family/patient expects to be discharged to:: Skilled nursing facility Living Arrangements: Other (Comment)                  Prior Function   unknown         PT Goals (current goals can now be found in the care plan section) Acute Rehab PT Goals Patient Stated Goal: not stated PT Goal Formulation: Patient unable to participate in goal setting Potential to Achieve Goals: Poor    Frequency    Min 3X/week      PT Plan  Pt will be seen to attempt to improve his bed mobility and encourage  Pt to participate in exercises to assist in decreasing burden on care givers.                              End of Session  Activity Tolerance: Patient limited by fatigue;Patient limited by pain;Patient limited by lethargy Patient left: in bed;with call bell/phone within reach;with bed alarm set Nurse Communication: Mobility status PT Visit Diagnosis: Unsteadiness on feet (R26.81);Other abnormalities of gait and mobility (R26.89);Muscle weakness (generalized) (M62.81)     Time: 0940-1005 PT Time Calculation (min) (ACUTE ONLY): 25 min  Charges:  $Therapeutic Exercise: 23-37 mins                       Virgina Organ, PT CLT [phone] 02/15/2020, 10:13 AM

## 2020-02-15 NOTE — Progress Notes (Signed)
Patient is refusing to take his medicine. Have tried explaining the importance, but patient still refuses. Anytime food/medicine/drink is placed up to patient's mouth he closes his mouth shut and grunts.

## 2020-02-15 NOTE — Progress Notes (Signed)
PROGRESS NOTE   Ronnie Gregory  GLO:[id number] DOB: [birth year] DOA: 02/11/2020 PCP: Ignatius Specking, MD   Chief Complaint  Patient presents with   Failure To Thrive   Level of care: Telemetry   Brief Admission History:  87 y.o. male with medical history significant for coronary artery disease, hypertension, DM, hypothyroidism. Patient was brought to the ED from [facility] nursing home reports of altered mental status, and poor appetite.   He has been at [facility] but with failure to thrive daughter had him sent to ED where he was found to have a right lung PE with pulmonary infarction.  PCCM recommended keeping at [facility] and providing IV heparin infusion.    Assessment & Plan:   Principal Problem:   Pulmonary embolus (HCC) Active Problems:   S/P coronary artery stent placement   Acute metabolic encephalopathy    Hyperlipidemia due to type 2 diabetes mellitus (HCC)   Vascular dementia without behavioral disturbance (HCC)    Acute respiratory failure with hypoxia secondary to acute right-sided pulmonary Embolus  - Lovenox full dose- transition to PO eliquis as patient wants to avoid shots -weaned off O2 -echo: EF 60-65%  Lactic acidosis  - no infection found - treating with IV fluids  CAD  - stable  Chronic urinary retention  - continue chronic indwelling foley - started on tamsulosin.   -Outpatient urology follow up recommended.   Type 2 diabetes mellitus  -uncontrolled as evidenced by a1c 7.3%  -continue SSI coverage   Hypothyroidism -continue home levothyroxine.    Dementia - with precipitous decline in last month since COVID and now admitted again with a pulmonary embolus. -delirium precautions -Palliative care consult  Will resume home celexa and monitor for improvements-- if over the next several days he does not improve will need to re-visit palliative care and GOC   DVT prophylaxis: eliquis Code Status: full  Family Communication: spoke with  daughter on phone Disposition:  Status is: Inpatient  Remains inpatient appropriate because:IV treatments appropriate due to intensity of illness or inability to take PO and Inpatient level of care appropriate due to severity of illness   Dispo: The patient is from: SNF              Anticipated d/c is to: SNF              Anticipated d/c date is: 3 days              Patient currently is not medically stable to d/c.   Difficult to place patient No   Consultants:   Palliative medicine    Subjective: Asked me to leave him alone  Objective: Vitals:   02/14/20 2139 02/15/20 0553 02/15/20 0733 02/15/20 1144  BP: (!) 137/93 (!) 123/97 (!) 144/68   Pulse: (!) 101 90 88 62  Resp: 18 20 (!) 23 (!) 28  Temp: 99.5 F (37.5 C) 99.1 F (37.3 C)    TempSrc: Oral Oral    SpO2: 92% 96% 90% 91%  Weight:      Height:        Intake/Output Summary (Last 24 hours) at 02/15/2020 1233 Last data filed at 02/15/2020 0553 Gross per 24 hour  Intake 240 ml  Output 1325 ml  Net -1085 ml   Filed Weights   02/11/20 1333 02/11/20 1334 02/12/20 1851  Weight: 88.9 kg 88.9 kg 85 kg    Examination:   General: Appearance:     Overweight male in no acute  distress     Lungs:     respirations unlabored  Heart:    Normal heart rate. Normal rhythm. No murmurs, rubs, or gallops.   MS:   All extremities are intact.   Neurologic:   Will awaken but tries to fall back asleep     Data Reviewed: I have personally reviewed following labs and imaging studies  CBC: Recent Labs  Lab 02/09/20 0430 02/11/20 1542 02/12/20 0650 02/13/20 0534 02/14/20 0611 02/15/20 0628  WBC 8.4 9.4 6.8 6.0 6.7 5.9  NEUTROABS 5.7 6.5  --   --   --   --   HGB 10.3* 10.9* 8.6* 8.2* 8.0* 8.7*  HCT 32.4* 34.2* 27.8* 26.0* 26.0* 27.5*  MCV 97.3 98.8 98.9 97.7 98.1 97.2  PLT 466* 643* 480* 529* 468* 429*    Basic Metabolic Panel: Recent Labs  Lab 02/09/20 0430 02/11/20 1542 02/13/20 1134 02/15/20 0628  NA 137  133* 133* 132*  K 3.4* 3.6 3.2* 3.4*  CL 100 99 105 104  CO2 22 23 21* 19*  GLUCOSE 113* 135* 114* 109*  BUN 21 21 12 9   CREATININE 1.20 1.20 0.99 0.99  CALCIUM 8.5* 8.8* 7.8* 8.1*    GFR: Estimated Creatinine Clearance: 57.9 mL/min (by C-G formula based on SCr of 0.99 mg/dL).  Liver Function Tests: Recent Labs  Lab 02/09/20 0430 02/11/20 1542  AST 26 29  ALT 39 37  ALKPHOS 93 110  BILITOT 0.2* 0.4  PROT 6.1* 6.5  ALBUMIN 2.3* 2.5*    CBG: Recent Labs  Lab 02/14/20 1112 02/14/20 1627 02/14/20 2118 02/15/20 0741 02/15/20 1108  GLUCAP 187* 117* 102* 101* 106*    Recent Results (from the past 240 hour(s))  Blood culture (routine x 2)     Status: None (Preliminary result)   Collection Time: 02/11/20  3:42 PM   Specimen: Right Antecubital; Blood  Result Value Ref Range Status   Specimen Description   Final    RIGHT ANTECUBITAL BOTTLES DRAWN AEROBIC AND ANAEROBIC   Special Requests Blood Culture adequate volume  Final   Culture   Final    NO GROWTH 4 DAYS Performed at [facility], [address], [place], [place]    Report Status PENDING  Incomplete  Blood culture (routine x 2)     Status: None (Preliminary result)   Collection Time: 02/11/20  3:46 PM   Specimen: BLOOD RIGHT HAND  Result Value Ref Range Status   Specimen Description BLOOD RIGHT HAND BOTTLES DRAWN AEROBIC ONLY  Final   Special Requests   Final    Blood Culture results may not be optimal due to an inadequate volume of blood received in culture bottles   Culture   Final    NO GROWTH 4 DAYS Performed at [facility], [address], [place], [place]    Report Status PENDING  Incomplete  SARS CORONAVIRUS 2 (TAT 6-24 HRS) Nasopharyngeal Nasopharyngeal Swab     Status: None   Collection Time: 02/12/20 11:16 AM   Specimen: Nasopharyngeal Swab  Result Value Ref Range Status   SARS Coronavirus 2 NEGATIVE NEGATIVE Final    Comment: (NOTE) SARS-CoV-2 target nucleic acids are NOT  DETECTED.  The SARS-CoV-2 RNA is generally detectable in upper and lower respiratory specimens during the acute phase of infection. Negative results do not preclude SARS-CoV-2 infection, do not rule out co-infections with other pathogens, and should not be used as the sole basis for treatment or other patient management decisions. Negative  results must be combined with clinical observations, patient history, and epidemiological information. The expected result is Negative.  Fact Sheet for Patients: HairSlick.no  Fact Sheet for Healthcare Providers: quierodirigir.com  This test is not yet approved or cleared by the [place] FDA and  has been authorized for detection and/or diagnosis of SARS-CoV-2 by FDA under an Emergency Use Authorization (EUA). This EUA will remain  in effect (meaning this test can be used) for the duration of the COVID-19 declaration under Se ction 564(b)(1) of the Act, 21 U.S.C. section 360bbb-3(b)(1), unless the authorization is terminated or revoked sooner.  Performed at [facility], [address] [place], [place] [address]   MRSA PCR Screening     Status: None   Collection Time: 02/12/20  7:40 PM   Specimen: Nasopharyngeal  Result Value Ref Range Status   MRSA by PCR NEGATIVE NEGATIVE Final    Comment:        The GeneXpert MRSA Assay (FDA approved for NASAL specimens only), is one component of a comprehensive MRSA colonization surveillance program. It is not intended to diagnose MRSA infection nor to guide or monitor treatment for MRSA infections. Performed at [facility], [address] [place], [place] [address]      Radiology Studies: No results found. Scheduled Meds:  apixaban  10 mg Oral BID   Followed by   Melene Muller ON 02/21/2020] apixaban  5 mg Oral BID   aspirin EC  81 mg Oral Q breakfast   Chlorhexidine Gluconate Cloth  6 each Topical Daily   citalopram  20 mg  Oral Daily   insulin aspart  0-5 Units Subcutaneous QHS   insulin aspart  0-9 Units Subcutaneous TID WC   levothyroxine  75 mcg Oral QAC breakfast   metoprolol tartrate  25 mg Oral BID   pantoprazole  40 mg Oral Daily   polyethylene glycol  17 g Oral Daily   pravastatin  40 mg Oral QHS   tamsulosin  0.4 mg Oral QPC supper   vitamin B-12  1,000 mcg Oral Daily   Continuous Infusions:   LOS: 4 days   Time spent: 25 mins   [place] Art, DO  How to contact the [facility] Attending or Consulting provider 7A - 7P or covering provider during after hours 7P -7A, for this patient?  1. Check the care team in [facility] and look for a) attending/consulting TRH provider listed and b) the [facility] team listed 2. Log into [email or web address] and use [place]'s universal password to access. If you do not have the password, please contact the hospital operator. 3. Locate the [facility] provider you are looking for under [facility] and page to a number that you can be directly reached. 4. If you still have difficulty reaching the provider, please page the [facility] (Director on Call) for the Hospitalists listed on amion for assistance.  02/15/2020, 12:33 PM

## 2020-02-15 NOTE — TOC Progression Note (Signed)
Transition of Care [facility]) - Progression Note    Patient Details  Name: Ronnie Gregory MRN: [id number] Date of Birth: 11/27/1933  Transition of Care [facility]) CM/SW Contact  Karn [place], [place] Phone Number: 02/15/2020, 10:44 AM  Clinical Narrative:  LCSW updated [name] at [facility] on pt. He will need repeat COVID test for return. MD notified. TOC will continue to follow.      Expected Discharge Plan: Skilled Nursing Facility Barriers to Discharge: Continued Medical Work up  Expected Discharge Plan and Services Expected Discharge Plan: Skilled Nursing Facility In-house Referral: Clinical Social Work   Post Acute Care Choice: Skilled Nursing Facility Living arrangements for the past 2 months: Single Family Home,Skilled Nursing Facility                   DME Agency: NA                   Social Determinants of Health (SDOH) Interventions    Readmission Risk Interventions Readmission Risk Prevention Plan 02/02/2020  Medication Screening Complete  Transportation Screening Complete  Some recent data might be hidden

## 2020-02-15 NOTE — Progress Notes (Signed)
**Note De-Identified  Obfuscation** RT note: Attempted to educate patient on IS; unable to follow directions.  RRT to continue to monitor.

## 2020-02-15 NOTE — Progress Notes (Signed)
Patient is still refusing food, drink, and medicine.

## 2020-02-15 NOTE — Progress Notes (Signed)
OT Cancellation Note  Patient Details Name: Ronnie Gregory MRN: [id number] DOB: Dec 31, 1933   Cancelled Treatment:    Reason Eval/Treat Not Completed: Medical issues which prohibited therapy;Other (comment). Pt not seen this date due to lethargy. Pt did not respond to verbal or physical cueing. Will check back on pt tomorrow and evaluate if cognition and lethargy improves to determine if he is appropriate for rehab services.   Seiya Silsby OT, MOT  Danie Chandler   02/15/2020, 8:52 AM

## 2020-02-16 DIAGNOSIS — E876 Hypokalemia: Secondary | ICD-10-CM | POA: Insufficient documentation

## 2020-02-16 DIAGNOSIS — R627 Adult failure to thrive: Secondary | ICD-10-CM

## 2020-02-16 LAB — CULTURE, BLOOD (ROUTINE X 2)
Culture: NO GROWTH
Culture: NO GROWTH
Special Requests: ADEQUATE

## 2020-02-16 LAB — GLUCOSE, CAPILLARY
Glucose-Capillary: 99 mg/dL (ref 70–99)
Glucose-Capillary: 101 mg/dL — ABNORMAL HIGH (ref 70–99)
Glucose-Capillary: 159 mg/dL — ABNORMAL HIGH (ref 70–99)
Glucose-Capillary: 90 mg/dL (ref 70–99)

## 2020-02-16 LAB — CBC
HCT: 30.6 % — ABNORMAL LOW (ref 39.0–52.0)
Hemoglobin: 9.7 g/dL — ABNORMAL LOW (ref 13.0–17.0)
MCH: 30.5 pg (ref 26.0–34.0)
MCHC: 31.7 g/dL (ref 30.0–36.0)
MCV: 96.2 fL (ref 80.0–100.0)
Platelets: 514 10*3/uL — ABNORMAL HIGH (ref 150–400)
RBC: 3.18 MIL/uL — ABNORMAL LOW (ref 4.22–5.81)
RDW: 13.5 % (ref 11.5–15.5)
WBC: 7.8 10*3/uL (ref 4.0–10.5)
nRBC: 0.3 % — ABNORMAL HIGH (ref 0.0–0.2)

## 2020-02-16 MED ORDER — SODIUM CHLORIDE 0.9 % IV SOLN: INTRAVENOUS | Status: DC

## 2020-02-16 MED ORDER — ENOXAPARIN SODIUM 80 MG/0.8ML ~~LOC~~ SOLN
80.0000 mg | Freq: Two times a day (BID) | SUBCUTANEOUS | Status: DC
  Administered 2020-02-16 – 2020-02-18 (×4): 80 mg via SUBCUTANEOUS
  Filled 2020-02-16 (×5): qty 0.8

## 2020-02-16 NOTE — Progress Notes (Signed)
Patient is alert to self, refusing all medications and drink.

## 2020-02-16 NOTE — Progress Notes (Signed)
PROGRESS NOTE   Ronnie Gregory  YTK:[id number] DOB: 22-Sep-1933 DOA: 02/11/2020 PCP: Ignatius Specking, MD   Chief Complaint  Patient presents with  . Failure To Thrive   Level of care: Med-Surg   Brief Admission History:  87 y.o. male with medical history significant for coronary artery disease, hypertension, DM, hypothyroidism. Patient was brought to the ED from [facility] nursing home reports of altered mental status, and poor appetite.   He has been at [facility] but with failure to thrive daughter had him sent to ED where he was found to have a right lung PE with pulmonary infarction.  PCCM recommended keeping at [facility] and providing IV heparin infusion.    Assessment & Plan:   Principal Problem:   Pulmonary embolus (HCC) Active Problems:   S/P coronary artery stent placement   Acute metabolic encephalopathy    Hyperlipidemia due to type 2 diabetes mellitus (HCC)   Vascular dementia without behavioral disturbance (HCC)    Acute respiratory failure with hypoxia secondary to acute right-sided pulmonary Embolus  - Lovenox full dose- transitioned to PO but patient not able consistently taking PO so will change back to shots -weaned off O2 -echo: EF 60-65%  Lactic acidosis  - no infection found  CAD  - stable  Chronic urinary retention  - continue chronic indwelling foley - started on tamsulosin.   -Outpatient urology follow up recommended.   Type 2 diabetes mellitus  -uncontrolled as evidenced by a1c 7.3%  -continue SSI coverage   Hypothyroidism -continue home levothyroxine.    Dementia/FTT- with precipitous decline in last month since COVID and now admitted again with a pulmonary embolus. -delirium precautions -Palliative care consult  Continues to refused PO intake and pills. Will ask palliative care to revisit when able.    DVT prophylaxis: eliquis Code Status: full  Family Communication: spoke with daughter on phone 1/27 Disposition:  Status is:  Inpatient  Remains inpatient appropriate because:IV treatments appropriate due to intensity of illness or inability to take PO and Inpatient level of care appropriate due to severity of illness   Dispo: The patient is from: SNF              Anticipated d/c is to: SNF              Anticipated d/c date is: 3 days              Patient currently is not medically stable to d/c.   Difficult to place patient No   Consultants:   Palliative medicine    Subjective: Opens eyes but does not talk  Objective: Vitals:   02/15/20 1144 02/15/20 1338 02/15/20 2110 02/16/20 0454  BP:  140/68 (!) 173/74 (!) 162/92  Pulse: 62 79 82 91  Resp: (!) 28 20 18 18   Temp:  98.9 F (37.2 C) 98.9 F (37.2 C) 98 F (36.7 C)  TempSrc:   Oral   SpO2: 91% 93% 100% 97%  Weight:      Height:        Intake/Output Summary (Last 24 hours) at 02/16/2020 1328 Last data filed at 02/16/2020 0400 Gross per 24 hour  Intake 75 ml  Output 2000 ml  Net -1925 ml   Filed Weights   02/11/20 1333 02/11/20 1334 02/12/20 1851  Weight: 88.9 kg 88.9 kg 85 kg    Examination:   General: Appearance:     Overweight male in no acute distress     Lungs:  respirations unlabored, no wheezing  Heart:    Normal heart rate. Normal rhythm. No murmurs, rubs, or gallops.   MS:   All extremities are intact.   Neurologic:   Sleeping, moves and will awaken to sternal rubs                        Data Reviewed: I have personally reviewed following labs and imaging studies  CBC: Recent Labs  Lab 02/11/20 1542 02/12/20 0650 02/13/20 0534 02/14/20 0611 02/15/20 0628 02/16/20 0644  WBC 9.4 6.8 6.0 6.7 5.9 7.8  NEUTROABS 6.5  --   --   --   --   --   HGB 10.9* 8.6* 8.2* 8.0* 8.7* 9.7*  HCT 34.2* 27.8* 26.0* 26.0* 27.5* 30.6*  MCV 98.8 98.9 97.7 98.1 97.2 96.2  PLT 643* 480* 529* 468* 429* 514*    Basic Metabolic Panel: Recent Labs  Lab 02/11/20 1542 02/13/20 1134 02/15/20 0628  NA 133* 133* 132*  K 3.6  3.2* 3.4*  CL 99 105 104  CO2 23 21* 19*  GLUCOSE 135* 114* 109*  BUN 21 12 9   CREATININE 1.20 0.99 0.99  CALCIUM 8.8* 7.8* 8.1*    GFR: Estimated Creatinine Clearance: 57.9 mL/min (by C-G formula based on SCr of 0.99 mg/dL).  Liver Function Tests: Recent Labs  Lab 02/11/20 1542  AST 29  ALT 37  ALKPHOS 110  BILITOT 0.4  PROT 6.5  ALBUMIN 2.5*    CBG: Recent Labs  Lab 02/15/20 1108 02/15/20 1628 02/15/20 2112 02/16/20 0731 02/16/20 1141  GLUCAP 106* 130* 87 99 101*    Recent Results (from the past 240 hour(s))  Blood culture (routine x 2)     Status: None   Collection Time: 02/11/20  3:42 PM   Specimen: Right Antecubital; Blood  Result Value Ref Range Status   Specimen Description   Final    RIGHT ANTECUBITAL BOTTLES DRAWN AEROBIC AND ANAEROBIC   Special Requests Blood Culture adequate volume  Final   Culture   Final    NO GROWTH 5 DAYS Performed at [facility], [address] [place], [place]    Report Status 02/16/2020 FINAL  Final  Blood culture (routine x 2)     Status: None   Collection Time: 02/11/20  3:46 PM   Specimen: BLOOD RIGHT HAND  Result Value Ref Range Status   Specimen Description BLOOD RIGHT HAND BOTTLES DRAWN AEROBIC ONLY  Final   Special Requests   Final    Blood Culture results may not be optimal due to an inadequate volume of blood received in culture bottles   Culture   Final    NO GROWTH 5 DAYS Performed at [facility], [address], [place], [place]    Report Status 02/16/2020 FINAL  Final  SARS CORONAVIRUS 2 (TAT 6-24 HRS) Nasopharyngeal Nasopharyngeal Swab     Status: None   Collection Time: 02/12/20 11:16 AM   Specimen: Nasopharyngeal Swab  Result Value Ref Range Status   SARS Coronavirus 2 NEGATIVE NEGATIVE Final    Comment: (NOTE) SARS-CoV-2 target nucleic acids are NOT DETECTED.  The SARS-CoV-2 RNA is generally detectable in upper and lower respiratory specimens during the acute phase of  infection. Negative results do not preclude SARS-CoV-2 infection, do not rule out co-infections with other pathogens, and should not be used as the sole basis for treatment or other patient management decisions. Negative results must be combined with clinical observations,  patient history, and epidemiological information. The expected result is Negative.  Fact Sheet for Patients: HairSlick.no  Fact Sheet for Healthcare Providers: quierodirigir.com  This test is not yet approved or cleared by the [place] FDA and  has been authorized for detection and/or diagnosis of SARS-CoV-2 by FDA under an Emergency Use Authorization (EUA). This EUA will remain  in effect (meaning this test can be used) for the duration of the COVID-19 declaration under Se ction 564(b)(1) of the Act, 21 U.S.C. section 360bbb-3(b)(1), unless the authorization is terminated or revoked sooner.  Performed at [facility], [address], [place], [place] [address]   MRSA PCR Screening     Status: None   Collection Time: 02/12/20  7:40 PM   Specimen: Nasopharyngeal  Result Value Ref Range Status   MRSA by PCR NEGATIVE NEGATIVE Final    Comment:        The GeneXpert MRSA Assay (FDA approved for NASAL specimens only), is one component of a comprehensive MRSA colonization surveillance program. It is not intended to diagnose MRSA infection nor to guide or monitor treatment for MRSA infections. Performed at [facility] [address] [place], [place] [address]      Radiology Studies: No results found. Scheduled Meds: . apixaban  10 mg Oral BID   Followed by  . [START ON 02/21/2020] apixaban  5 mg Oral BID  . aspirin EC  81 mg Oral Q breakfast  . Chlorhexidine Gluconate Cloth  6 each Topical Daily  . citalopram  20 mg Oral Daily  . insulin aspart  0-5 Units Subcutaneous QHS  . insulin aspart  0-9 Units Subcutaneous TID WC  . levothyroxine   75 mcg Oral QAC breakfast  . metoprolol tartrate  25 mg Oral BID  . pantoprazole  40 mg Oral Daily  . polyethylene glycol  17 g Oral Daily  . pravastatin  40 mg Oral QHS  . tamsulosin  0.4 mg Oral QPC supper  . vitamin B-12  1,000 mcg Oral Daily   Continuous Infusions: . sodium chloride 50 mL/hr at 02/16/20 1104    LOS: 5 days   Time spent: 25 mins   [place] Art, DO  How to contact the [facility] Attending or Consulting provider 7A - 7P or covering provider during after hours 7P -7A, for this patient?  1. Check the care team in [facility] and look for a) attending/consulting TRH provider listed and b) the [facility] team listed 2. Log into [email or web address] and use [place]'s universal password to access. If you do not have the password, please contact the hospital operator. 3. Locate the [facility] provider you are looking for under [facility] and page to a number that you can be directly reached. 4. If you still have difficulty reaching the provider, please page the [facility] (Director on Call) for the Hospitalists listed on amion for assistance.  02/16/2020, 1:28 PM

## 2020-02-16 NOTE — Progress Notes (Signed)
ANTICOAGULATION CONSULT NOTE -  Pharmacy Consult for  Lovenox>> Eliquis-->Lovenox Indication: pulmonary embolus  Patient Measurements: Height: 5\' 7"  (170.2 cm) Weight: 85 kg (187 lb 6.3 oz) IBW/kg (Calculated) : 66.1  HEPARIN DW (KG): 83.3  Vital Signs: Temp: 98 F (36.7 C) (01/28 0454) BP: 162/92 (01/28 0454) Pulse Rate: 91 (01/28 0454)  Labs: Recent Labs    02/14/20 0611 02/15/20 0628 02/16/20 0644  HGB 8.0* 8.7* 9.7*  HCT 26.0* 27.5* 30.6*  PLT 468* 429* 514*  CREATININE  --  0.99  --     Estimated Creatinine Clearance: 57.9 mL/min (by C-G formula based on SCr of 0.99 mg/dL).  Assessment: Pharmacy re-consulted to dose Lovenox for this   87 yo male with PE. Patient was transitioned to Eliquis two days ago, but  is now refusing to take oral medications.    Goal of Therapy:  Anti-xa =  0.6-1 Monitor platelets by anticoagulation protocol: Yes   Plan:  D/C Eliquis  Start Lovenox 80mg  q12h Every  other day CBC and BMP while on Lovenox  97, Pharm. D. Clinical Pharmacist 02/16/2020 1:51 PM

## 2020-02-16 NOTE — Plan of Care (Signed)

## 2020-02-16 NOTE — Progress Notes (Signed)
Palliative:  HPI: 87 y.o. male  with past medical history of CAD, hypertension, diabetes, hypothyroidism, recent hospitalization 01/09/20-01/25/20 at [facility] with COVID infection and went to SNF, hospitalized 1/12-1/17/22 for SIRS with acute metabolic encephalopathy and acute kidney injury but no infection identified admitted on 02/11/2020 with altered mental status, poor appetite and found to have pulmonary embolism.    I met today at Mr. Groninger bedside. He is resting and looks at me but does not really engage in any conversation with me. Daughter, Freda Munro, is at bedside. We discussed concern for Mr. Shallenberger as he is not eating or drinking and today refusing his medication. We discussed his decline since Dec 21st when he was admitted with COVID. His COVID infection has improved but he has not bounced back. We discussed reasoning behind this as poor quality of life he does not find acceptable with physical decline. We also discussed possibility that he could have an underlying dementia that was triggered by infection/hospitalization (CT head with evidence of chronic small vessel ischemia changes) and that now he is failing to thrive and very likely will not improve as there is nothing we can do to reverse his current condition. I worry that he will continue to worsen and if not eating/drinking or taking medications he cannot live long. Freda Munro expresses the plan to take him home and care for him in his familiar home environment with hopes this may help him to improve. I believe this is a good plan IF family able to provide his care. I also expressed the importance of Freda Munro and family to continue to discuss a plan of care if he worsens or does not improve. We even discussed the option of hospice assistance at home. Freda Munro seems open to these options but refers to sister, Jenny Reichmann, who she confirms to be HCPOA.   I reached out to speak with [facility]. I explained to her my concern with her father not eating,  drinking, refusing medications. I explained his CT head and that there is a good chance this does not improve. Jenny Reichmann shares with me that they have decided to get through the weekend and decisions to be made on Monday. Jenny Reichmann did not seem aware of plan to take him home and plans to discuss this with Freda Munro further.   All questions/concerns addressed. Emotional support provided. Updated Dr. Eliseo Squires.   Exam: Lying in bed. Looks around and makes eye contact. Watches television. He will sometimes follow simple commands or answer but not consistently. Confused. No distress. Resting comfortably. Breathing regular, unlabored. Abd flat.   Plan: - Family planning to make decisions Monday.  - I will have my colleague reach out Monday for follow up.   35 min  Vinie Sill, NP Palliative Medicine Team Pager [phone] (Please see [email or web address] for schedule) Team Phone [phone]    Greater than 50%  of this time was spent counseling and coordinating care related to the above assessment and plan

## 2020-02-16 NOTE — Progress Notes (Signed)
OT Cancellation Note  Patient Details Name: Ronnie Gregory MRN: [id number] DOB: 06-Mar-1933   Cancelled Treatment:    Reason Eval/Treat Not Completed: Fatigue/lethargy limiting ability to participate;Medical issues which prohibited therapy. Attempted OT evaluation this am, this is the 3rd attempt at eval. Pt looking at OT on entry into room, turns head, no attempts to communicate verbally or follow simple one-step instructions. Pt requiring total assist with ADLs at this time due to cognition and lack of interaction with rehab staff. At this time pt is not appropriate for OT services, per [facility] protocol order will be discontinued as eval has been attempted 3x without success.   Recommend return to [facility] for LTC if there is no change in cognition or interaction with staff and environment.    Ezra Sites, OTR/L  [phone] 02/16/2020, 8:41 AM

## 2020-02-16 NOTE — Care Management Important Message (Signed)
Important Message  Patient Details  Name: Ronnie Gregory MRN: [id number] Date of Birth: 1933-04-05   Medicare Important Message Given:  Yes     Corey Harold 02/16/2020, 3:37 PM

## 2020-02-16 NOTE — TOC Progression Note (Signed)
Transition of Care [facility]) - Progression Note   Patient Details  Name: Ronnie Gregory MRN: [id number] Date of Birth: 09/21/1933  Transition of Care [facility]) CM/SW Contact  Ewing Schlein, LCSW Phone Number: 02/16/2020, 11:15 AM  Clinical Narrative: CSW updated [name] at [facility] patient will not be discharging today. TOC to follow.   Expected Discharge Plan: Skilled Nursing Facility Barriers to Discharge: Continued Medical Work up  Expected Discharge Plan and Services Expected Discharge Plan: Skilled Nursing Facility In-house Referral: Clinical Social Work Post Acute Care Choice: Skilled Nursing Facility Living arrangements for the past 2 months: Single Family Home,Skilled Nursing Facility DME Agency: NA  Readmission Risk Interventions Readmission Risk Prevention Plan 02/02/2020  Medication Screening Complete  Transportation Screening Complete  Some recent data might be hidden

## 2020-02-17 LAB — GLUCOSE, CAPILLARY
Glucose-Capillary: 97 mg/dL (ref 70–99)
Glucose-Capillary: 104 mg/dL — ABNORMAL HIGH (ref 70–99)
Glucose-Capillary: 178 mg/dL — ABNORMAL HIGH (ref 70–99)
Glucose-Capillary: 129 mg/dL — ABNORMAL HIGH (ref 70–99)

## 2020-02-17 MED ORDER — DICLOFENAC SODIUM 1 % EX GEL
2.0000 g | Freq: Four times a day (QID) | CUTANEOUS | Status: DC
  Administered 2020-02-17 – 2020-02-18 (×5): 2 g via TOPICAL
  Filled 2020-02-17: qty 100

## 2020-02-17 NOTE — Progress Notes (Signed)
PROGRESS NOTE   Ronnie Gregory  MGQ:[id number] DOB: [birth year] DOA: 02/11/2020 PCP: Ignatius Specking, MD   Chief Complaint  Patient presents with  . Failure To Thrive   Level of care: Med-Surg   Brief Admission History:  87 y.o. male with medical history significant for coronary artery disease, hypertension, DM, hypothyroidism. Patient was brought to the ED from [facility] nursing home reports of altered mental status, and poor appetite.   He has been at [facility] but with failure to thrive daughter had him sent to ED where he was found to have a right lung PE with pulmonary infarction.  PCCM recommended keeping at [facility] and providing IV heparin infusion.    Assessment & Plan:   Principal Problem:   Pulmonary embolus (HCC) Active Problems:   S/P coronary artery stent placement   Acute metabolic encephalopathy    Hyperlipidemia due to type 2 diabetes mellitus (HCC)   Vascular dementia without behavioral disturbance (HCC)    Acute respiratory failure with hypoxia secondary to acute right-sided pulmonary Embolus  - Lovenox full dose- transitioned to PO but patient not able consistently taking PO so will change back to shots -weaned off O2 -echo: EF 60-65%  Lactic acidosis  - no infection found  CAD  - stable  Chronic urinary retention  - continue chronic indwelling foley - started on tamsulosin.   -Outpatient urology follow up recommended.   Type 2 diabetes mellitus  -uncontrolled as evidenced by a1c 7.3%  -continue SSI coverage   Hypothyroidism -continue home levothyroxine.    Dementia/FTT- with precipitous decline in last month since COVID and now admitted again with a pulmonary embolus. -delirium precautions -Palliative care consult  50% of breakfast per documentation this AM and it appears he was able to take him morning meds as well   DVT prophylaxis: eliquis Code Status: full  Family Communication: spoke with daughter on phone 1/27 Disposition:   Status is: Inpatient  Remains inpatient appropriate because:IV treatments appropriate due to intensity of illness or inability to take PO and Inpatient level of care appropriate due to severity of illness   Dispo: The patient is from: SNF              Anticipated d/c is to: SNF              Anticipated d/c date is: Monday?  If continues to eat              Patient currently is not medically stable to d/c.   Difficult to place patient No   Consultants:   Palliative medicine    Subjective: Will say a few words  Objective: Vitals:   02/16/20 0454 02/16/20 1402 02/16/20 2038 02/17/20 1219  BP: (!) 162/92 (!) 146/74 (!) 149/65 (!) 162/88  Pulse: 91 (!) 114 97 95  Resp: 18 18 18    Temp: 98 F (36.7 C) 98 F (36.7 C) 98.6 F (37 C)   TempSrc:  Oral Oral   SpO2: 97% 98% 92%   Weight:      Height:        Intake/Output Summary (Last 24 hours) at 02/17/2020 1322 Last data filed at 02/17/2020 0900 Gross per 24 hour  Intake 982.53 ml  Output 1050 ml  Net -67.47 ml   Filed Weights   02/11/20 1333 02/11/20 1334 02/12/20 1851  Weight: 88.9 kg 88.9 kg 85 kg    Examination:  General: Appearance:     Overweight male in no acute distress  Lungs:     respirations unlabored  Heart:    Normal heart rate. Normal rhythm. No murmurs, rubs, or gallops.   MS:   All extremities are intact.   Neurologic:   Will awaken and say a few words    Data Reviewed: I have personally reviewed following labs and imaging studies  CBC: Recent Labs  Lab 02/11/20 1542 02/12/20 0650 02/13/20 0534 02/14/20 0611 02/15/20 0628 02/16/20 0644  WBC 9.4 6.8 6.0 6.7 5.9 7.8  NEUTROABS 6.5  --   --   --   --   --   HGB 10.9* 8.6* 8.2* 8.0* 8.7* 9.7*  HCT 34.2* 27.8* 26.0* 26.0* 27.5* 30.6*  MCV 98.8 98.9 97.7 98.1 97.2 96.2  PLT 643* 480* 529* 468* 429* 514*    Basic Metabolic Panel: Recent Labs  Lab 02/11/20 1542 02/13/20 1134 02/15/20 0628  NA 133* 133* 132*  K 3.6 3.2* 3.4*  CL 99  105 104  CO2 23 21* 19*  GLUCOSE 135* 114* 109*  BUN 21 12 9   CREATININE 1.20 0.99 0.99  CALCIUM 8.8* 7.8* 8.1*    GFR: Estimated Creatinine Clearance: 57.9 mL/min (by C-G formula based on SCr of 0.99 mg/dL).  Liver Function Tests: Recent Labs  Lab 02/11/20 1542  AST 29  ALT 37  ALKPHOS 110  BILITOT 0.4  PROT 6.5  ALBUMIN 2.5*    CBG: Recent Labs  Lab 02/16/20 1141 02/16/20 1630 02/16/20 2136 02/17/20 0748 02/17/20 1115  GLUCAP 101* 159* 90 97 104*    Recent Results (from the past 240 hour(s))  Blood culture (routine x 2)     Status: None   Collection Time: 02/11/20  3:42 PM   Specimen: Right Antecubital; Blood  Result Value Ref Range Status   Specimen Description   Final    RIGHT ANTECUBITAL BOTTLES DRAWN AEROBIC AND ANAEROBIC   Special Requests Blood Culture adequate volume  Final   Culture   Final    NO GROWTH 5 DAYS Performed at [facility], [address], [place], [place]    Report Status 02/16/2020 FINAL  Final  Blood culture (routine x 2)     Status: None   Collection Time: 02/11/20  3:46 PM   Specimen: BLOOD RIGHT HAND  Result Value Ref Range Status   Specimen Description BLOOD RIGHT HAND BOTTLES DRAWN AEROBIC ONLY  Final   Special Requests   Final    Blood Culture results may not be optimal due to an inadequate volume of blood received in culture bottles   Culture   Final    NO GROWTH 5 DAYS Performed at [facility], [address] [place], [place]    Report Status 02/16/2020 FINAL  Final  SARS CORONAVIRUS 2 (TAT 6-24 HRS) Nasopharyngeal Nasopharyngeal Swab     Status: None   Collection Time: 02/12/20 11:16 AM   Specimen: Nasopharyngeal Swab  Result Value Ref Range Status   SARS Coronavirus 2 NEGATIVE NEGATIVE Final    Comment: (NOTE) SARS-CoV-2 target nucleic acids are NOT DETECTED.  The SARS-CoV-2 RNA is generally detectable in upper and lower respiratory specimens during the acute phase of infection.  Negative results do not preclude SARS-CoV-2 infection, do not rule out co-infections with other pathogens, and should not be used as the sole basis for treatment or other patient management decisions. Negative results must be combined with clinical observations, patient history, and epidemiological information. The expected result is Negative.  Fact Sheet for Patients: 02/14/20  Fact  Sheet for Healthcare Providers: quierodirigir.com  This test is not yet approved or cleared by the [place] FDA and  has been authorized for detection and/or diagnosis of SARS-CoV-2 by FDA under an Emergency Use Authorization (EUA). This EUA will remain  in effect (meaning this test can be used) for the duration of the COVID-19 declaration under Se ction 564(b)(1) of the Act, 21 U.S.C. section 360bbb-3(b)(1), unless the authorization is terminated or revoked sooner.  Performed at [facility], [address], [place], [place] [address]   MRSA PCR Screening     Status: None   Collection Time: 02/12/20  7:40 PM   Specimen: Nasopharyngeal  Result Value Ref Range Status   MRSA by PCR NEGATIVE NEGATIVE Final    Comment:        The GeneXpert MRSA Assay (FDA approved for NASAL specimens only), is one component of a comprehensive MRSA colonization surveillance program. It is not intended to diagnose MRSA infection nor to guide or monitor treatment for MRSA infections. Performed at [facility], [address], [place], [place] [address]      Radiology Studies: No results found. Scheduled Meds: . aspirin EC  81 mg Oral Q breakfast  . Chlorhexidine Gluconate Cloth  6 each Topical Daily  . citalopram  20 mg Oral Daily  . enoxaparin (LOVENOX) injection  80 mg Subcutaneous Q12H  . insulin aspart  0-5 Units Subcutaneous QHS  . insulin aspart  0-9 Units Subcutaneous TID WC  . levothyroxine  75 mcg Oral QAC breakfast  . metoprolol  tartrate  25 mg Oral BID  . pantoprazole  40 mg Oral Daily  . polyethylene glycol  17 g Oral Daily  . pravastatin  40 mg Oral QHS  . tamsulosin  0.4 mg Oral QPC supper  . vitamin B-12  1,000 mcg Oral Daily   Continuous Infusions: . sodium chloride 50 mL/hr at 02/16/20 1655    LOS: 6 days   Time spent: 25 mins   Joseph Art, DO  How to contact the [facility] Attending or Consulting provider 7A - 7P or covering provider during after hours 7P -7A, for this patient?  1. Check the care team in [facility] and look for a) attending/consulting TRH provider listed and b) the [facility] team listed 2. Log into [email or web address] and use [place]'s universal password to access. If you do not have the password, please contact the hospital operator. 3. Locate the [facility] provider you are looking for under [facility] and page to a number that you can be directly reached. 4. If you still have difficulty reaching the provider, please page the [facility] (Director on Call) for the Hospitalists listed on amion for assistance.  02/17/2020, 1:22 PM

## 2020-02-18 LAB — GLUCOSE, CAPILLARY
Glucose-Capillary: 120 mg/dL — ABNORMAL HIGH (ref 70–99)
Glucose-Capillary: 106 mg/dL — ABNORMAL HIGH (ref 70–99)
Glucose-Capillary: 136 mg/dL — ABNORMAL HIGH (ref 70–99)
Glucose-Capillary: 125 mg/dL — ABNORMAL HIGH (ref 70–99)

## 2020-02-18 MED ORDER — APIXABAN 5 MG PO TABS: 5.0000 mg | ORAL_TABLET | Freq: Two times a day (BID) | ORAL | Status: DC

## 2020-02-18 MED ORDER — APIXABAN 5 MG PO TABS
10.0000 mg | ORAL_TABLET | Freq: Two times a day (BID) | ORAL | Status: DC
  Administered 2020-02-18 – 2020-02-19 (×2): 10 mg via ORAL
  Filled 2020-02-18 (×3): qty 2

## 2020-02-18 NOTE — Progress Notes (Signed)
PROGRESS NOTE   Ronnie Gregory  IDP:[id number] DOB: [birth year] DOA: 02/11/2020 PCP: Ignatius Specking, MD   Chief Complaint  Patient presents with  . Failure To Thrive   Level of care: Med-Surg   Brief Admission History:  87 y.o. male with medical history significant for coronary artery disease, hypertension, DM, hypothyroidism. Patient was brought to the ED from [facility] nursing home reports of altered mental status, and poor appetite.   He has been at [facility] but with failure to thrive daughter had him sent to ED where he was found to have a right lung PE with pulmonary infarction.  PCCM recommended keeping at [facility] and providing IV heparin infusion.    Assessment & Plan:   Principal Problem:   Pulmonary embolus (HCC) Active Problems:   S/P coronary artery stent placement   Acute metabolic encephalopathy    Hyperlipidemia due to type 2 diabetes mellitus (HCC)   Vascular dementia without behavioral disturbance (HCC)    Acute respiratory failure with hypoxia secondary to acute right-sided pulmonary Embolus  - transition back to elquis -weaned O2 to off if able -home O2 study -echo: EF 60-65%  Lactic acidosis  - no infection found  CAD  - stable  Chronic urinary retention  - continue chronic indwelling foley - started on tamsulosin.   -Outpatient urology follow up recommended- [place]  Type 2 diabetes mellitus  -uncontrolled as evidenced by a1c 7.3%  -continue SSI coverage   Hypothyroidism -continue home levothyroxine.    Dementia/FTT- with precipitous decline in last month since COVID and now admitted again with a pulmonary embolus. -delirium precautions -Palliative care consult  Eats better when family around.  Daughters plan to take home with home health.   DVT prophylaxis: eliquis Code Status: full  Family Communication: spoke with daughter on phone 1/27 Disposition:  Status is: Inpatient  Remains inpatient appropriate  because:IV treatments appropriate due to intensity of illness or inability to take PO and Inpatient level of care appropriate due to severity of illness   Dispo: The patient is from: SNF              Anticipated d/c is to: SNF              Anticipated d/c date is: AM              Patient currently is not medically stable to d/c.   Difficult to place patient No   Consultants:   Palliative medicine    Subjective: Eats when daughter there  Objective: Vitals:   02/17/20 1219 02/17/20 2112 02/18/20 0348 02/18/20 1100  BP: (!) 162/88 127/69 117/65 133/80  Pulse: 95 76 90 87  Resp:  20 20   Temp:  99.2 F (37.3 C) 99.3 F (37.4 C)   TempSrc:  Oral    SpO2:  95% 100% 92%  Weight:      Height:        Intake/Output Summary (Last 24 hours) at 02/18/2020 1331 Last data filed at 02/18/2020 0119 Gross per 24 hour  Intake 240 ml  Output 600 ml  Net -360 ml   Filed Weights   02/11/20 1333 02/11/20 1334 02/12/20 1851  Weight: 88.9 kg 88.9 kg 85 kg    Examination:  General: Appearance:     Overweight male in no acute distress     Lungs:      respirations unlabored  Heart:    Normal heart rate. Normal rhythm. No  murmurs, rubs, or gallops.   MS:   All extremities are intact.   Neurologic:   Will awaken but closes eyes them back      Data Reviewed: I have personally reviewed following labs and imaging studies  CBC: Recent Labs  Lab 02/11/20 1542 02/12/20 0650 02/13/20 0534 02/14/20 0611 02/15/20 0628 02/16/20 0644  WBC 9.4 6.8 6.0 6.7 5.9 7.8  NEUTROABS 6.5  --   --   --   --   --   HGB 10.9* 8.6* 8.2* 8.0* 8.7* 9.7*  HCT 34.2* 27.8* 26.0* 26.0* 27.5* 30.6*  MCV 98.8 98.9 97.7 98.1 97.2 96.2  PLT 643* 480* 529* 468* 429* 514*    Basic Metabolic Panel: Recent Labs  Lab 02/11/20 1542 02/13/20 1134 02/15/20 0628  NA 133* 133* 132*  K 3.6 3.2* 3.4*  CL 99 105 104  CO2 23 21* 19*  GLUCOSE 135* 114* 109*  BUN 21 12 9   CREATININE 1.20 0.99 0.99  CALCIUM 8.8*  7.8* 8.1*    GFR: Estimated Creatinine Clearance: 57.9 mL/min (by C-G formula based on SCr of 0.99 mg/dL).  Liver Function Tests: Recent Labs  Lab 02/11/20 1542  AST 29  ALT 37  ALKPHOS 110  BILITOT 0.4  PROT 6.5  ALBUMIN 2.5*    CBG: Recent Labs  Lab 02/17/20 1115 02/17/20 1615 02/17/20 2109 02/18/20 0753 02/18/20 1115  GLUCAP 104* 178* 129* 120* 106*    Recent Results (from the past 240 hour(s))  Blood culture (routine x 2)     Status: None   Collection Time: 02/11/20  3:42 PM   Specimen: Right Antecubital; Blood  Result Value Ref Range Status   Specimen Description   Final    RIGHT ANTECUBITAL BOTTLES DRAWN AEROBIC AND ANAEROBIC   Special Requests Blood Culture adequate volume  Final   Culture   Final    NO GROWTH 5 DAYS Performed at [facility], [address], [place], [place]    Report Status 02/16/2020 FINAL  Final  Blood culture (routine x 2)     Status: None   Collection Time: 02/11/20  3:46 PM   Specimen: BLOOD RIGHT HAND  Result Value Ref Range Status   Specimen Description BLOOD RIGHT HAND BOTTLES DRAWN AEROBIC ONLY  Final   Special Requests   Final    Blood Culture results may not be optimal due to an inadequate volume of blood received in culture bottles   Culture   Final    NO GROWTH 5 DAYS Performed at [facility], [address] [place], [place]    Report Status 02/16/2020 FINAL  Final  SARS CORONAVIRUS 2 (TAT 6-24 HRS) Nasopharyngeal Nasopharyngeal Swab     Status: None   Collection Time: 02/12/20 11:16 AM   Specimen: Nasopharyngeal Swab  Result Value Ref Range Status   SARS Coronavirus 2 NEGATIVE NEGATIVE Final    Comment: (NOTE) SARS-CoV-2 target nucleic acids are NOT DETECTED.  The SARS-CoV-2 RNA is generally detectable in upper and lower respiratory specimens during the acute phase of infection. Negative results do not preclude SARS-CoV-2 infection, do not rule out co-infections with other pathogens, and  should not be used as the sole basis for treatment or other patient management decisions. Negative results must be combined with clinical observations, patient history, and epidemiological information. The expected result is Negative.  Fact Sheet for Patients: 02/14/20  Fact Sheet for Healthcare Providers: HairSlick.no  This test is not yet approved or cleared by the  [place] [employer] and  has been authorized for detection and/or diagnosis of SARS-CoV-2 by FDA under an TEFL teacher (EUA). This EUA will remain  in effect (meaning this test can be used) for the duration of the COVID-19 declaration under Se ction 564(b)(1) of the Act, 21 U.S.C. section 360bbb-3(b)(1), unless the authorization is terminated or revoked sooner.  Performed at [facility], [address], [place], [place] [address]   MRSA PCR Screening     Status: None   Collection Time: 02/12/20  7:40 PM   Specimen: Nasopharyngeal  Result Value Ref Range Status   MRSA by PCR NEGATIVE NEGATIVE Final    Comment:        The GeneXpert MRSA Assay (FDA approved for NASAL specimens only), is one component of a comprehensive MRSA colonization surveillance program. It is not intended to diagnose MRSA infection nor to guide or monitor treatment for MRSA infections. Performed at [facility], [address] [place], [place] [address]      Radiology Studies: No results found. Scheduled Meds: . aspirin EC  81 mg Oral Q breakfast  . Chlorhexidine Gluconate Cloth  6 each Topical Daily  . citalopram  20 mg Oral Daily  . diclofenac Sodium  2 g Topical QID  . enoxaparin (LOVENOX) injection  80 mg Subcutaneous Q12H  . insulin aspart  0-5 Units Subcutaneous QHS  . insulin aspart  0-9 Units Subcutaneous TID WC  . levothyroxine  75 mcg Oral QAC breakfast  . metoprolol tartrate  25 mg Oral BID  . pantoprazole  40 mg Oral Daily  . polyethylene  glycol  17 g Oral Daily  . pravastatin  40 mg Oral QHS  . tamsulosin  0.4 mg Oral QPC supper  . vitamin B-12  1,000 mcg Oral Daily   Continuous Infusions: . sodium chloride 50 mL/hr at 02/17/20 1819    LOS: 7 days   Time spent: 25 mins   Joseph Art, DO  How to contact the [facility] Attending or Consulting provider 7A - 7P or covering provider during after hours 7P -7A, for this patient?  1. Check the care team in [facility] and look for a) attending/consulting TRH provider listed and b) the [facility] team listed 2. Log into [email or web address] and use [place]'s universal password to access. If you do not have the password, please contact the hospital operator. 3. Locate the [facility] provider you are looking for under [facility] and page to a number that you can be directly reached. 4. If you still have difficulty reaching the provider, please page the [facility] (Director on Call) for the Hospitalists listed on amion for assistance.  02/18/2020, 1:31 PM

## 2020-02-18 NOTE — TOC Progression Note (Signed)
Transition of Care [facility]) - Progression Note    Patient Details  Name: Ronnie Gregory MRN: [id number] Date of Birth: Aug 03, 1933  Transition of [facility]) CM/SW Contact  Elliot Gault, LCSW Phone Number: 02/18/2020, 1:55 PM  Clinical Narrative:     TOC notified by MD that family plan to take pt home with [facility] at dc and pt will need hospital bed for dc. Hospital bed referral given to [facility] at [facility] with anticipation of dc tomorrow. TOC will follow up in AM to try and arrange [facility] services.   Expected Discharge Plan: Home w Home Health Services Barriers to Discharge: Continued Medical Work up,Other (comment) (Family to make decisions with palliative)  Expected Discharge Plan and Services Expected Discharge Plan: Home w Home Health Services In-house Referral: Clinical Social Work   Post Acute Care Choice: Skilled Nursing Facility Living arrangements for the past 2 months: Single Family Home,Skilled Nursing Facility                 DME Arranged: Hospital bed DME Agency: [facility] Date DME Agency Contacted: 02/18/20   Representative spoke with at DME Agency: [facility]             Social Determinants of Health (SDOH) Interventions    Readmission Risk Interventions Readmission Risk Prevention Plan 02/02/2020  Medication Screening Complete  Transportation Screening Complete  Some recent data might be hidden

## 2020-02-18 NOTE — Progress Notes (Signed)
ANTICOAGULATION CONSULT NOTE -  Pharmacy Consult for  Lovenox> Eliquis-->Lovenox-->Eliquis Indication: pulmonary embolus  Patient Measurements: Height: 5\' 7"  (170.2 cm) Weight: 85 kg (187 lb 6.3 oz) IBW/kg (Calculated) : 66.1  HEPARIN DW (KG): 83.3  Vital Signs: Temp: 99.3 F (37.4 C) (01/30 0348) BP: 133/80 (01/30 1100) Pulse Rate: 87 (01/30 1100)  Labs: Recent Labs    02/16/20 0644  HGB 9.7*  HCT 30.6*  PLT 514*    Estimated Creatinine Clearance: 57.9 mL/min (by C-G formula based on SCr of 0.99 mg/dL).  Assessment: Pharmacy re-consulted to dose apixaban for this   87 yo male with PE. Patient was on Lovenox for two days because he wasn't taking any PO meds, but is ready to do so now.  Goal of Therapy:  Anti-xa =  0.6-1 Monitor platelets by anticoagulation protocol: Yes   Plan:  D/C Lovenox Start apixaban 10mg  bid x7 days, then apixaban 5mg  bid thereafter  Every  other day CBC and BMP  97, Pharm. D. Clinical Pharmacist 02/18/2020 2:00 PM

## 2020-02-18 NOTE — Progress Notes (Signed)
Patient requires frequent re-positioning of the body in ways that cannot be achieved with an ordinary bed or wedge pillow, to eliminate pain, reduce pressure, and the head of the bed to be elevated more than 30 degrees most of the time due to Acute respiratory failure with hypoxia secondary to acute right-sided pulmonary Embolus, dementia, and Failure to Thrive.

## 2020-02-19 LAB — CBC
HCT: 28.3 % — ABNORMAL LOW (ref 39.0–52.0)
Hemoglobin: 9.1 g/dL — ABNORMAL LOW (ref 13.0–17.0)
MCH: 31.5 pg (ref 26.0–34.0)
MCHC: 32.2 g/dL (ref 30.0–36.0)
MCV: 97.9 fL (ref 80.0–100.0)
Platelets: 431 10*3/uL — ABNORMAL HIGH (ref 150–400)
RBC: 2.89 MIL/uL — ABNORMAL LOW (ref 4.22–5.81)
RDW: 14.5 % (ref 11.5–15.5)
WBC: 6.7 10*3/uL (ref 4.0–10.5)
nRBC: 0 % (ref 0.0–0.2)

## 2020-02-19 LAB — BASIC METABOLIC PANEL
Anion gap: 9 (ref 5–15)
BUN: 11 mg/dL (ref 8–23)
CO2: 22 mmol/L (ref 22–32)
Calcium: 8.2 mg/dL — ABNORMAL LOW (ref 8.9–10.3)
Chloride: 103 mmol/L (ref 98–111)
Creatinine, Ser: 0.96 mg/dL (ref 0.61–1.24)
GFR, Estimated: >60 mL/min (ref >60)
Glucose, Bld: 107 mg/dL — ABNORMAL HIGH (ref 70–99)
Potassium: 3.1 mmol/L — ABNORMAL LOW (ref 3.5–5.1)
Sodium: 134 mmol/L — ABNORMAL LOW (ref 135–145)

## 2020-02-19 LAB — GLUCOSE, CAPILLARY
Glucose-Capillary: 107 mg/dL — ABNORMAL HIGH (ref 70–99)
Glucose-Capillary: 115 mg/dL — ABNORMAL HIGH (ref 70–99)

## 2020-02-19 MED ORDER — APIXABAN (ELIQUIS) VTE STARTER PACK (10MG AND 5MG): ORAL_TABLET | ORAL | 0 refills | Status: AC

## 2020-02-19 MED ORDER — TAMSULOSIN HCL 0.4 MG PO CAPS: 0.4000 mg | ORAL_CAPSULE | Freq: Every day | ORAL | 0 refills | Status: AC

## 2020-02-19 MED ORDER — CYANOCOBALAMIN 1000 MCG PO TABS: 1000.0000 ug | ORAL_TABLET | Freq: Every day | ORAL | Status: AC

## 2020-02-19 NOTE — TOC Transition Note (Signed)
Transition of Care [facility]) - CM/SW Discharge Note   Patient Details  Name: Ronnie Gregory MRN: [id number] Date of Birth: [birth year]  Transition of Care [facility]) CM/SW Contact:  Karn Cassis, LCSW Phone Number: 02/19/2020, 1:36 PM   Clinical Narrative:  Pt d/c today and will return home with home health services. [name] at [facility] notified pt will not be returning to SNF. LCSW spoke with both of pt's daughters regarding d/c. They are agreeable to home health. Referred to [facility] who accepts for PT, OT, SW. Pt requiring home O2 which was referred to [facility]. Pt's home O2 and hospital bed have been delivered to the home per daughter, [name]. LCSW will arrange EMS transport home.      Final next level of care: Home w Home Health Services Barriers to Discharge: Barriers Resolved   Patient Goals and CMS Choice     Choice offered to / list presented to : Adult Children  Discharge Placement                       Discharge Plan and Services In-house Referral: Clinical Social Work   Post Acute Care Choice: Skilled Nursing Facility          DME Arranged: Hospital bed,Oxygen DME Agency: [facility] Date DME Agency Contacted: 02/19/20 Time DME Agency Contacted: 1335 Representative spoke with at DME Agency: [name] Arranged: PT,OT,Social Work HH Agency: [facility] Date [facility] Agency Contacted: 02/19/20 Time HH Agency Contacted: 1335 Representative spoke with at [facility] Agency: [name]  Social Determinants of Health (SDOH) Interventions     Readmission Risk Interventions Readmission Risk Prevention Plan 02/02/2020  Medication Screening Complete  Transportation Screening Complete  Some recent data might be hidden

## 2020-02-19 NOTE — Progress Notes (Signed)
SATURATION QUALIFICATIONS: (This note is used to comply with regulatory documentation for home oxygen)  Patient Saturations on Room Air at Rest = 90%  Patient Saturations on Room Air while Ambulating = 88%  Patient Saturations on 2Liters of oxygen while Ambulating = 94%  Please briefly explain why patient needs home oxygen: patient does not ambulate well. MD stated to do home evaluation in bed. Patient dropped to 88% when moving patients leg to side of bed patient was unable to sit all the way up. Placed oxygen 2 liters on patient patient oxygen saturation increased to 94. MD was in patients room at the time this occurred.

## 2020-02-19 NOTE — Discharge Summary (Signed)
Physician Discharge Summary  Ronnie Gregory ZOX:[id number] DOB: 01-23-1933 DOA: 02/11/2020  PCP: Ignatius Specking, MD  Admit date: 02/11/2020 Discharge date: 02/19/2020  Admitted From: home Discharge disposition: home   Recommendations for Outpatient Follow-Up:   1. eliquis started 2. Home health 3. Outpatient referral to palliative care- has not been the same since COVID infection in December 4. Will need voiding trial with urology   Discharge Diagnosis:   Principal Problem:   Pulmonary embolus (HCC) Active Problems:   S/P coronary artery stent placement   Acute metabolic encephalopathy    Hyperlipidemia due to type 2 diabetes mellitus (HCC)   Vascular dementia without behavioral disturbance (HCC)    Discharge Condition: Improved.  Diet recommendation:   Regular.  Wound care: None.  Code status: Full.   History of Present Illness:   Ronnie Gregory is a 87 y.o. male with medical history significant for coronary artery disease, hypertension, DM, hypothyroidism. Patient was brought to the ED from [facility] nursing home reports of altered mental status, and poor appetite.  At time of my evaluation, patient is lethargic, opens eyes to voice, verbalizing but not answering questions, hence history is obtained from chart review and from talking to nursing home staff. Per staff since patient was admitted there last week, he has had a flat affect, not responding much and not eating, so they do not know what his baseline is. No vomiting or loose stools. He has had a poor appetite.   Patients daughter- Arline Asp reports that prior to hospitalization at [facility] patient lived alone, able to perform all ADLS, ambulated without assistance of assistive devices, and had very occasional episodes of sundowning otherwise no memory deficits. Daughter reports patient has lost sense of taste so he has not been eating. Foley was inserted 01/09/20 for urinary retention.    Hospital  Course by Problem:   Acute respiratory failure with hypoxia secondary to acute right-sided pulmonary Embolus  - transition back to elquis -home O2 arranged -echo: EF 60-65%  Lactic acidosis  - no infection found  CAD  - stable  Chronic urinary retention  - continue chronic indwelling foley - started on tamsulosin.   -Outpatient urology follow up recommended- [place] with [place]. [place]  Type 2 diabetes mellitus  -uncontrolled as evidenced by a1c 7.3%  -resume home meds  Hypothyroidism -continue home levothyroxine.    Dementia/FTT- with precipitous decline in last month since COVID and now admitted again with a pulmonary embolus. -delirium precautions -Palliative care consult- would recommend outpatient follow up for consideration of transition to hospice   Eats better when family around.  Daughters plan to take home with home health- home O2 arranged      Medical Consultants:   Palliative care   Discharge Exam:   Vitals:   02/19/20 0439 02/19/20 0823  BP: 116/87 133/71  Pulse: 78 74  Resp:    Temp:    SpO2: 92%    Vitals:   02/18/20 1405 02/18/20 2024 02/19/20 0439 02/19/20 0823  BP: (!) 107/52 112/71 116/87 133/71  Pulse: 79 72 78 74  Resp: 17 20    Temp: 98 F (36.7 C) 98.9 F (37.2 C)    TempSrc: Oral     SpO2: 93% 93% 92%   Weight:      Height:        General exam: Appears calm and comfortable.   The results of significant diagnostics from this hospitalization (including imaging, microbiology, ancillary  and laboratory) are listed below for reference.     Procedures and Diagnostic Studies:   DG Chest 2 View  Result Date: 02/11/2020 CLINICAL DATA:  Altered mental status. EXAM: CHEST - 2 VIEW COMPARISON:  Chest radiograph 06/30/2020. FINDINGS: Stable cardiac and mediastinal contours. Low lung volumes. Bibasilar atelectasis. No large area pulmonary consolidation. No pleural effusion or pneumothorax. IMPRESSION: No active  cardiopulmonary disease. Electronically Signed   By: Annia Belt M.D.   On: 02/11/2020 15:01   CT Head Wo Contrast  Result Date: 02/11/2020 CLINICAL DATA:  Altered level of consciousness, decreased appetite, lethargy EXAM: CT HEAD WITHOUT CONTRAST TECHNIQUE: Contiguous axial images were obtained from the base of the skull through the vertex without intravenous contrast. COMPARISON:  01/31/2020 FINDINGS: Brain: Stable confluent hypodensities throughout the periventricular and subcortical white matter consistent with chronic small vessel ischemic change. No sign of acute infarct or hemorrhage. Lateral ventricles and midline structures are stable. No acute extra-axial fluid collections. No mass effect. Vascular: No hyperdense vessel or unexpected calcification. Skull: Normal. Negative for fracture or focal lesion. Sinuses/Orbits: No acute finding. Other: None. IMPRESSION: 1. Stable chronic small vessel ischemic changes. No acute intracranial process. Electronically Signed   By: Sharlet Salina M.D.   On: 02/11/2020 14:56   CT ANGIO CHEST PE W OR WO CONTRAST  Result Date: 02/11/2020 CLINICAL DATA:  87 year old male with history of lethargy. Evaluate for pulmonary embolism. EXAM: CT ANGIOGRAPHY CHEST WITH CONTRAST TECHNIQUE: Multidetector CT imaging of the chest was performed using the standard protocol during bolus administration of intravenous contrast. Multiplanar CT image reconstructions and MIPs were obtained to evaluate the vascular anatomy. CONTRAST:  30mL OMNIPAQUE IOHEXOL 350 MG/ML SOLN COMPARISON:  No priors. FINDINGS: Cardiovascular: There are numerous filling defects within the distal right main, lobar, segmental and subsegmental sized branches of the pulmonary arteries to the right lung, indicative of pulmonary embolism. No definite left-sided filling defects are noted. Heart size is normal. There is no significant pericardial fluid, thickening or pericardial calcification. There is aortic  atherosclerosis, as well as atherosclerosis of the great vessels of the mediastinum and the coronary arteries, including calcified atherosclerotic plaque in the left anterior descending and right coronary arteries. Mediastinum/Nodes: No pathologically enlarged mediastinal or hilar lymph nodes. Please note that accurate exclusion of hilar adenopathy is limited on noncontrast CT scans. Esophagus is unremarkable in appearance. No axillary lymphadenopathy. Lungs/Pleura: There is a small amount of dependent airspace consolidation in the right lower lobe. Lungs are otherwise clear. No suspicious appearing pulmonary nodules or masses are noted. No pleural effusions. Upper Abdomen: Aortic atherosclerosis. Musculoskeletal: There are no aggressive appearing lytic or blastic lesions noted in the visualized portions of the skeleton. Review of the MIP images confirms the above findings. IMPRESSION: 1. Study is positive for pulmonary embolism in the right lung. In addition, there is some airspace consolidation in the right lower lobe which may represent alveolar hemorrhage from pulmonary infarction. 2. Aortic atherosclerosis, in addition to 2 vessel coronary artery disease. Please note that although the presence of coronary artery calcium documents the presence of coronary artery disease, the severity of this disease and any potential stenosis cannot be assessed on this non-gated CT examination. Assessment for potential risk factor modification, dietary therapy or pharmacologic therapy may be warranted, if clinically indicated. Critical Value/emergent results were called by telephone at the time of interpretation on 02/11/2020 at 5:50 pm to provider ABIGAIL HARRIS , who verbally acknowledged these results. Aortic Atherosclerosis (ICD10-I70.0). Electronically Signed   By: Reuel Boom  Entrikin M.D.   On: 02/11/2020 17:51   US Venous Img Lower Bilateral (DVT)  Result Date: 02/12/2020 CLINICAL DATA:  History of pulmonary emboli EXAM:  BILATERAL LOWER EXTREMITY VENOUS DOPPLER ULTRASOUND TECHNIQUE: Gray-scale sonography with graded compression, as well as color Doppler and duplex ultrasound were performed to evaluate the lower extremity deep venous systems from the level of the common femoral vein and including the common femoral, femoral, profunda femoral, popliteal and calf veins including the posterior tibial, peroneal and gastrocnemius veins when visible. The superficial great saphenous vein was also interrogated. Spectral Doppler was utilized to evaluate flow at rest and with distal augmentation maneuvers in the common femoral, femoral and popliteal veins. The left lower leg was not evaluated due to the combative nature of the patient. COMPARISON:  None. FINDINGS: RIGHT LOWER EXTREMITY Common Femoral Vein: No evidence of thrombus. Normal compressibility, respiratory phasicity and response to augmentation. Saphenofemoral Junction: No evidence of thrombus. Normal compressibility and flow on color Doppler imaging. Profunda Femoral Vein: No evidence of thrombus. Normal compressibility and flow on color Doppler imaging. Femoral Vein: No evidence of thrombus. Normal compressibility, respiratory phasicity and response to augmentation. Popliteal Vein: No evidence of thrombus. Normal compressibility, respiratory phasicity and response to augmentation. Calf Veins: No evidence of thrombus. Normal compressibility and flow on color Doppler imaging. Superficial Great Saphenous Vein: No evidence of thrombus. Normal compressibility. Venous Reflux:  None. Other Findings:  None. LEFT LOWER EXTREMITY Common Femoral Vein: No evidence of thrombus. Normal compressibility, respiratory phasicity and response to augmentation. Saphenofemoral Junction: No evidence of thrombus. Normal compressibility and flow on color Doppler imaging. Profunda Femoral Vein: No evidence of thrombus. Normal compressibility and flow on color Doppler imaging. Femoral Vein: No evidence of  thrombus. Normal compressibility, respiratory phasicity and response to augmentation. Popliteal Vein: Not evaluated Calf Veins: Not evaluate Superficial Great Saphenous Vein: Not evaluate Venous Reflux:  None. Other Findings:  None. IMPRESSION: No evidence of deep venous thrombosis is noted. The left lower leg was incompletely evaluated due to the patient's combative nature. Electronically Signed   By: Alcide Clever M.D.   On: 02/12/2020 10:08     Labs:   Basic Metabolic Panel: Recent Labs  Lab 02/13/20 1134 02/15/20 0628 02/19/20 0506  NA 133* 132* 134*  K 3.2* 3.4* 3.1*  CL 105 104 103  CO2 21* 19* 22  GLUCOSE 114* 109* 107*  BUN 12 9 11   CREATININE 0.99 0.99 0.96  CALCIUM 7.8* 8.1* 8.2*   GFR Estimated Creatinine Clearance: 59.7 mL/min (by C-G formula based on SCr of 0.96 mg/dL). Liver Function Tests: No results for input(s): AST, ALT, ALKPHOS, BILITOT, PROT, ALBUMIN in the last 168 hours. No results for input(s): LIPASE, AMYLASE in the last 168 hours. No results for input(s): AMMONIA in the last 168 hours. Coagulation profile No results for input(s): INR, PROTIME in the last 168 hours.  CBC: Recent Labs  Lab 02/13/20 0534 02/14/20 0611 02/15/20 0628 02/16/20 0644 02/19/20 0506  WBC 6.0 6.7 5.9 7.8 6.7  HGB 8.2* 8.0* 8.7* 9.7* 9.1*  HCT 26.0* 26.0* 27.5* 30.6* 28.3*  MCV 97.7 98.1 97.2 96.2 97.9  PLT 529* 468* 429* 514* 431*   Cardiac Enzymes: No results for input(s): CKTOTAL, CKMB, CKMBINDEX, TROPONINI in the last 168 hours. BNP: Invalid input(s): POCBNP CBG: Recent Labs  Lab 02/18/20 0753 02/18/20 1115 02/18/20 1614 02/18/20 2022 02/19/20 0808  GLUCAP 120* 106* 136* 125* 107*   D-Dimer No results for input(s): DDIMER in the last 72 hours.  Hgb A1c No results for input(s): HGBA1C in the last 72 hours. Lipid Profile No results for input(s): CHOL, HDL, LDLCALC, TRIG, CHOLHDL, LDLDIRECT in the last 72 hours. Thyroid function studies No results for  input(s): TSH, T4TOTAL, T3FREE, THYROIDAB in the last 72 hours.  Invalid input(s): FREET3 Anemia work up No results for input(s): VITAMINB12, FOLATE, FERRITIN, TIBC, IRON, RETICCTPCT in the last 72 hours. Microbiology Recent Results (from the past 240 hour(s))  Blood culture (routine x 2)     Status: None   Collection Time: 02/11/20  3:42 PM   Specimen: Right Antecubital; Blood  Result Value Ref Range Status   Specimen Description   Final    RIGHT ANTECUBITAL BOTTLES DRAWN AEROBIC AND ANAEROBIC   Special Requests Blood Culture adequate volume  Final   Culture   Final    NO GROWTH 5 DAYS Performed at [facility], [address], [place], [place] [address]    Report Status 02/16/2020 FINAL  Final  Blood culture (routine x 2)     Status: None   Collection Time: 02/11/20  3:46 PM   Specimen: BLOOD RIGHT HAND  Result Value Ref Range Status   Specimen Description BLOOD RIGHT HAND BOTTLES DRAWN AEROBIC ONLY  Final   Special Requests   Final    Blood Culture results may not be optimal due to an inadequate volume of blood received in culture bottles   Culture   Final    NO GROWTH 5 DAYS Performed at [facility], [address] [place], [place] [address]    Report Status 02/16/2020 FINAL  Final  SARS CORONAVIRUS 2 (TAT 6-24 HRS) Nasopharyngeal Nasopharyngeal Swab     Status: None   Collection Time: 02/12/20 11:16 AM   Specimen: Nasopharyngeal Swab  Result Value Ref Range Status   SARS Coronavirus 2 NEGATIVE NEGATIVE Final    Comment: (NOTE) SARS-CoV-2 target nucleic acids are NOT DETECTED.  The SARS-CoV-2 RNA is generally detectable in upper and lower respiratory specimens during the acute phase of infection. Negative results do not preclude SARS-CoV-2 infection, do not rule out co-infections with other pathogens, and should not be used as the sole basis for treatment or other patient management decisions. Negative results must be combined with clinical observations, patient  history, and epidemiological information. The expected result is Negative.  Fact Sheet for Patients: HairSlick.no  Fact Sheet for Healthcare Providers: quierodirigir.com  This test is not yet approved or cleared by the [place] FDA and  has been authorized for detection and/or diagnosis of SARS-CoV-2 by FDA under an Emergency Use Authorization (EUA). This EUA will remain  in effect (meaning this test can be used) for the duration of the COVID-19 declaration under Se ction 564(b)(1) of the Act, 21 U.S.C. section 360bbb-3(b)(1), unless the authorization is terminated or revoked sooner.  Performed at [facility], [address] [place], [place] [address]   MRSA PCR Screening     Status: None   Collection Time: 02/12/20  7:40 PM   Specimen: Nasopharyngeal  Result Value Ref Range Status   MRSA by PCR NEGATIVE NEGATIVE Final    Comment:        The GeneXpert MRSA Assay (FDA approved for NASAL specimens only), is one component of a comprehensive MRSA colonization surveillance program. It is not intended to diagnose MRSA infection nor to guide or monitor treatment for MRSA infections. Performed at [facility], [address] [place], [place] [address]      Discharge Instructions:  Discharge Instructions    Diet - low sodium heart healthy   Complete by: As directed    Increase activity slowly   Complete by: As directed      Allergies as of 02/19/2020      Reactions   Hydrocodone    Other reaction(s): Confusion, Hallucinations   Mobic [meloxicam] Other (See Comments)   Mucus      Medication List    STOP taking these medications   tiZANidine 2 MG tablet Commonly known as: ZANAFLEX   UNABLE TO FIND     TAKE these medications   acetaminophen 325 MG tablet Commonly known as: TYLENOL Take 2 tablets (650 mg total) by mouth every 6 (six) hours as needed for mild pain or headache (or Fever >/= 101).    Apixaban Starter Pack (10mg  and 5mg ) Commonly known as: ELIQUIS STARTER PACK Take as directed on package: start with two-5mg  tablets twice daily for 7 days. On day 8, switch to one-5mg  tablet twice daily.   aspirin EC 81 MG tablet Take 1 tablet (81 mg total) by mouth daily with breakfast.   citalopram 20 MG tablet Commonly known as: CELEXA Take 20 mg by mouth daily.   cyanocobalamin 1000 MCG tablet Take 1 tablet (1,000 mcg total) by mouth daily. Start taking on: February 20, 2020   feeding supplement (PRO-STAT SUGAR FREE 64) Liqd Take 30 mLs by mouth 3 (three) times daily with meals.   ferrous sulfate 325 (65 FE) MG EC tablet Take 1 tablet (325 mg total) by mouth 2 (two) times daily with a meal.   levothyroxine 75 MCG tablet Commonly known as: SYNTHROID Take 1 tablet (75 mcg total) by mouth daily before breakfast.   magnesium hydroxide 400 MG/5ML suspension Commonly known as: MILK OF MAGNESIA Take 30 mLs by mouth once.   metoprolol tartrate 25 MG tablet Commonly known as: LOPRESSOR Take 1 tablet (25 mg total) by mouth 2 (two) times daily.   NovoLOG FlexPen 100 UNIT/ML FlexPen Generic drug: insulin aspart Inject 5 Units into the skin 3 (three) times daily with meals. Special instructions: If accu-check is >200 . Hold for accu-check 200 or less   ondansetron 4 MG tablet Commonly known as: ZOFRAN Take 1 tablet (4 mg total) by mouth every 8 (eight) hours as needed for nausea.   pantoprazole 40 MG tablet Commonly known as: PROTONIX Take 1 tablet (40 mg total) by mouth daily.   polyethylene glycol 17 g packet Commonly known as: MIRALAX / GLYCOLAX Take 17 g by mouth daily.   pravastatin 40 MG tablet Commonly known as: PRAVACHOL Take 1 tablet (40 mg total) by mouth at bedtime.   tamsulosin 0.4 MG Caps capsule Commonly known as: FLOMAX Take 1 capsule (0.4 mg total) by mouth daily after supper.            Durable Medical Equipment  (From admission, onward)          Start     Ordered   02/18/20 1326  For home use only DME Hospital bed  Once       Question Answer Comment  Length of Need Lifetime   The above medical condition requires: Patient requires the ability to reposition frequently   Head must be elevated greater than: 30 degrees   Bed type Semi-electric      02/18/20 1326          Follow-up Information    [name], [name], MD Follow up in 1 week(s).   Specialty: Internal  Medicine Contact information: [address] [place] [address] [phone]        Laqueta LindenKoneswaran, Suresh A, MD .   Specialty: Cardiology Contact information: [address] [place] [address] [phone]        Noel ChristmasKunkle, David, MD Follow up.   Specialty: Urology Why: voiding trial Contact information: [address] [place] [address] [phone]                Time coordinating discharge: 35 min  Signed:  Joseph ArtJessica U Zykia Walla DO  [facility] 02/19/2020, 8:43 AM

## 2020-02-19 NOTE — Care Management Important Message (Signed)
Important Message  Patient Details  Name: Ronnie Gregory MRN: [id number] Date of Birth: [birth year]   Medicare Important Message Given:  Yes     Corey Harold 02/19/2020, 12:46 PM

## 2020-02-19 NOTE — Progress Notes (Signed)
Patient discharged home with oxygen. EMS transporting patient home. Patient daughter Reginold Agent) updated on discharge and discharge paper work placed in packet. eliquis coupon placed in discharge packet. Patients clothes, hearing aid and glasses placed in patient belonging bag given to EMS. Hospital bed and oxygen has been set up at patients home. IV has been removed.

## 2020-02-23 ENCOUNTER — Encounter (HOSPITAL_COMMUNITY): Payer: Self-pay

## 2020-02-23 ENCOUNTER — Emergency Department (HOSPITAL_COMMUNITY)
Admission: EM | Admit: 2020-02-23 | Discharge: 2020-02-23 | Disposition: A | Discharge status: Home / Self Care | Payer: Medicare Other | Attending: Emergency Medicine | Admitting: Emergency Medicine

## 2020-02-23 ENCOUNTER — Emergency Department (HOSPITAL_COMMUNITY): Payer: Medicare Other

## 2020-02-23 ENCOUNTER — Other Ambulatory Visit: Payer: Self-pay

## 2020-02-23 DIAGNOSIS — E039 Hypothyroidism, unspecified: Secondary | ICD-10-CM | POA: Diagnosis not present

## 2020-02-23 DIAGNOSIS — Z955 Presence of coronary angioplasty implant and graft: Secondary | ICD-10-CM | POA: Diagnosis not present

## 2020-02-23 DIAGNOSIS — Z7982 Long term (current) use of aspirin: Secondary | ICD-10-CM | POA: Diagnosis not present

## 2020-02-23 DIAGNOSIS — F0150 Vascular dementia without behavioral disturbance: Secondary | ICD-10-CM | POA: Insufficient documentation

## 2020-02-23 DIAGNOSIS — N183 Chronic kidney disease, stage 3 unspecified: Secondary | ICD-10-CM | POA: Insufficient documentation

## 2020-02-23 DIAGNOSIS — R509 Fever, unspecified: Secondary | ICD-10-CM | POA: Diagnosis present

## 2020-02-23 DIAGNOSIS — U071 COVID-19: Secondary | ICD-10-CM | POA: Insufficient documentation

## 2020-02-23 DIAGNOSIS — Z79899 Other long term (current) drug therapy: Secondary | ICD-10-CM | POA: Insufficient documentation

## 2020-02-23 DIAGNOSIS — I2510 Atherosclerotic heart disease of native coronary artery without angina pectoris: Secondary | ICD-10-CM | POA: Insufficient documentation

## 2020-02-23 DIAGNOSIS — I129 Hypertensive chronic kidney disease with stage 1 through stage 4 chronic kidney disease, or unspecified chronic kidney disease: Secondary | ICD-10-CM | POA: Insufficient documentation

## 2020-02-23 DIAGNOSIS — Z794 Long term (current) use of insulin: Secondary | ICD-10-CM | POA: Diagnosis not present

## 2020-02-23 DIAGNOSIS — E1122 Type 2 diabetes mellitus with diabetic chronic kidney disease: Secondary | ICD-10-CM | POA: Insufficient documentation

## 2020-02-23 DIAGNOSIS — Z87891 Personal history of nicotine dependence: Secondary | ICD-10-CM | POA: Diagnosis not present

## 2020-02-23 LAB — URINALYSIS, ROUTINE W REFLEX MICROSCOPIC
Bilirubin Urine: NEGATIVE
Glucose, UA: >=500 mg/dL — AB
Ketones, ur: 20 mg/dL — AB
Leukocytes,Ua: NEGATIVE
Nitrite: NEGATIVE
Protein, ur: 30 mg/dL — AB
Specific Gravity, Urine: 1.02 (ref 1.005–1.030)
pH: 6 (ref 5.0–8.0)

## 2020-02-23 LAB — COMPREHENSIVE METABOLIC PANEL
ALT: 16 U/L (ref 0–44)
AST: 21 U/L (ref 15–41)
Albumin: 2.7 g/dL — ABNORMAL LOW (ref 3.5–5.0)
Alkaline Phosphatase: 90 U/L (ref 38–126)
Anion gap: 11 (ref 5–15)
BUN: 17 mg/dL (ref 8–23)
CO2: 20 mmol/L — ABNORMAL LOW (ref 22–32)
Calcium: 8.6 mg/dL — ABNORMAL LOW (ref 8.9–10.3)
Chloride: 97 mmol/L — ABNORMAL LOW (ref 98–111)
Creatinine, Ser: 1.07 mg/dL (ref 0.61–1.24)
GFR, Estimated: >60 mL/min (ref >60)
Glucose, Bld: 186 mg/dL — ABNORMAL HIGH (ref 70–99)
Potassium: 3.6 mmol/L (ref 3.5–5.1)
Sodium: 128 mmol/L — ABNORMAL LOW (ref 135–145)
Total Bilirubin: 0.7 mg/dL (ref 0.3–1.2)
Total Protein: 6.6 g/dL (ref 6.5–8.1)

## 2020-02-23 LAB — CBC
HCT: 32.9 % — ABNORMAL LOW (ref 39.0–52.0)
Hemoglobin: 10.6 g/dL — ABNORMAL LOW (ref 13.0–17.0)
MCH: 30.8 pg (ref 26.0–34.0)
MCHC: 32.2 g/dL (ref 30.0–36.0)
MCV: 95.6 fL (ref 80.0–100.0)
Platelets: 506 10*3/uL — ABNORMAL HIGH (ref 150–400)
RBC: 3.44 MIL/uL — ABNORMAL LOW (ref 4.22–5.81)
RDW: 15 % (ref 11.5–15.5)
WBC: 15.6 10*3/uL — ABNORMAL HIGH (ref 4.0–10.5)
nRBC: 0 % (ref 0.0–0.2)

## 2020-02-23 LAB — SARS CORONAVIRUS 2 BY RT PCR (HOSPITAL ORDER, PERFORMED IN ~~LOC~~ HOSPITAL LAB): SARS Coronavirus 2: POSITIVE — AB

## 2020-02-23 LAB — LACTIC ACID, PLASMA
Lactic Acid, Venous: 1.3 mmol/L (ref 0.5–1.9)
Lactic Acid, Venous: 1.2 mmol/L (ref 0.5–1.9)

## 2020-02-23 MED ORDER — ACETAMINOPHEN 160 MG/5ML PO SOLN
650.0000 mg | Freq: Once | ORAL | Status: AC
  Administered 2020-02-23: 650 mg via ORAL
  Filled 2020-02-23: qty 20.3

## 2020-02-23 MED ORDER — ACETAMINOPHEN 325 MG PO TABS: 650.0000 mg | ORAL_TABLET | Freq: Once | ORAL | Status: DC

## 2020-02-23 NOTE — Discharge Instructions (Addendum)
Call your primary care doctor or specialist as discussed in the next 2-3 days.   Return immediately back to the ER if:  Your symptoms worsen within the next 12-24 hours. You develop new symptoms such as new fevers, persistent vomiting, new pain, shortness of breath, or new weakness or numbness, or if you have any other concerns.  

## 2020-02-23 NOTE — ED Notes (Signed)
Date and time results received: 02/23/20 5:29 PM    Test: COVID Critical Value: +  Name of Provider Notified: Hong  Orders Received? Or Actions Taken?: MD notified

## 2020-02-23 NOTE — ED Provider Notes (Signed)
[facility] EMERGENCY DEPARTMENT Provider Note   CSN: [id number] Arrival date & time: 02/23/20  1455     History Chief Complaint  Patient presents with  . Altered Mental Status    Ronnie Gregory is a 87 y.o. male.  Patient presents with fever decreased energy decreased responsiveness.  Symptoms started last night.  Family states that he has a history of dementia but typically is more active than this.  He was diagnosed with Covid in December and since then has been having a very difficult time recovering.  He has been bedbound since December, they had discussed with the recent admission team about palliative care but family no longer wants to pursue that.  They prefer to take care of at home and has been having physical therapy at home.  Patient was less responsive and active today and was brought to the ER for repeat evaluation.  No reports of vomiting or diarrhea or cough.        Past Medical History:  Diagnosis Date  . Arthritis   . Chronic kidney disease   . Constipation    uses stool softener daily  . Foley catheter in place    urinary retention  . GERD (gastroesophageal reflux disease)   . Hypercholesteremia   . Hypertension   . Hypothyroidism   . Myocardial infarction [facility]) 2009   Dr. Cathren Harsh cardiologist 2 stents    Patient Active Problem List   Diagnosis Date Noted  . Hypokalemia 02/16/2020  . Pulmonary embolus ([place]) 02/11/2020  . AKI (acute kidney injury) ([place]) 02/08/2020  . Hypertension associated with diabetes ([place]) 02/07/2020  . Controlled type 2 diabetes mellitus with chronic kidney disease, with long-term current use of insulin ([place]) 02/07/2020  . Stage 3 chronic kidney disease due to diabetes mellitus ([place]) 02/07/2020  . Hyperlipidemia due to type 2 diabetes mellitus ([place]) 02/07/2020  . Vascular dementia without behavioral disturbance ([place]) 02/07/2020  . Depression due to dementia ([place]) 02/07/2020  . Hypothyroidism in adult 02/07/2020  .  GERD without esophagitis 02/07/2020  . Cauda equina syndrome ([place]) 02/07/2020  . Chronic constipation 02/07/2020  . Chronic iron deficiency anemia 02/07/2020  . Chronic retention of urine 02/07/2020  . SIRS (systemic inflammatory response syndrome) ([place]) 01/31/2020  . Sepsis secondary to UTI ([place]) 01/31/2020  . H/o COVID-19 virus infection--+ve 01/09/20 , negative 01/25/20 (vaccinated Fall of 2021) 01/31/2020  . Acute metabolic encephalopathy  83/15/1761  . CRI (chronic renal insufficiency), stage 3 (moderate) ([place]) 05/28/2017  . Carotid artery disease ([place]) 05/28/2017  . Spinal stenosis, lumbar region, with neurogenic claudication 10/16/2014  . CAD (coronary artery disease) 12/02/2012  . S/P coronary artery stent placement 12/02/2012    Past Surgical History:  Procedure Laterality Date  . APPENDECTOMY    . BACK SURGERY     several   . CARDIAC CATHETERIZATION    . CORONARY ANGIOPLASTY     3 years ago  . LUMBAR LAMINECTOMY/ DECOMPRESSION WITH MET-RX N/A 03/05/2015   Procedure: Removal of posterior hardware Lumbar one with Metrex;  Surgeon: Kristeen Miss, MD;  Location: [facility];  Service: Neurosurgery;  Laterality: N/A;  . SKIN SURGERY     skin cancer removed from forehead       Family History  Problem Relation Age of Onset  . Alzheimer's disease Mother     Social History   Tobacco Use  . Smoking status: Former Smoker    Start date: 01/20/1955    Quit date: 01/20/1960  Years since quitting: 60.1  . Smokeless tobacco: Never Used  Vaping Use  . Vaping Use: Never used  Substance Use Topics  . Alcohol use: No    Alcohol/week: 0.0 standard drinks  . Drug use: No    Home Medications Prior to Admission medications   Medication Sig Start Date End Date Taking? Authorizing Provider  acetaminophen (TYLENOL) 325 MG tablet Take 2 tablets (650 mg total) by mouth every 6 (six) hours as needed for mild pain or headache (or Fever >/= 101). 02/05/20  Yes Emokpae, Courage, MD   Amino Acids-Protein Hydrolys (FEEDING SUPPLEMENT, PRO-STAT SUGAR FREE 64,) LIQD Take 30 mLs by mouth 3 (three) times daily with meals.   Yes [provider]  APIXABAN (ELIQUIS) VTE STARTER PACK (10MG AND 5MG) Take as directed on package: start with two-19m tablets twice daily for 7 days. On day 8, switch to one-522mtablet twice daily. 02/19/20  Yes VaGeradine GirtDO  aspirin EC 81 MG tablet Take 1 tablet (81 mg total) by mouth daily with breakfast. 02/05/20 02/04/21 Yes Emokpae, Courage, MD  citalopram (CELEXA) 20 MG tablet Take 20 mg by mouth daily. 11/01/19  Yes [provider]  ferrous sulfate 325 (65 FE) MG EC tablet Take 1 tablet (325 mg total) by mouth 2 (two) times daily with a meal. 02/05/20  Yes Emokpae, Courage, MD  HYDROcodone-acetaminophen (NORCO) 10-325 MG tablet Take 1 tablet by mouth every 4 (four) hours as needed. 02/06/20  Yes [provider]  levothyroxine (SYNTHROID) 75 MCG tablet Take 1 tablet (75 mcg total) by mouth daily before breakfast. 02/05/20  Yes Emokpae, Courage, MD  metoprolol tartrate (LOPRESSOR) 25 MG tablet Take 1 tablet (25 mg total) by mouth 2 (two) times daily. 02/05/20  Yes Emokpae, Courage, MD  pantoprazole (PROTONIX) 40 MG tablet Take 1 tablet (40 mg total) by mouth daily. 02/06/20  Yes Emokpae, Courage, MD  polyethylene glycol (MIRALAX / GLYCOLAX) 17 g packet Take 17 g by mouth daily. 02/05/20  Yes Emokpae, Courage, MD  pravastatin (PRAVACHOL) 40 MG tablet Take 1 tablet (40 mg total) by mouth at bedtime. 02/05/20  Yes Emokpae, Courage, MD  tamsulosin (FLOMAX) 0.4 MG CAPS capsule Take 1 capsule (0.4 mg total) by mouth daily after supper. 02/19/20  Yes VaEulogio Bear, DO  vitamin B-12 1000 MCG tablet Take 1 tablet (1,000 mcg total) by mouth daily. 02/20/20  Yes Vann, Jessica U, DO  insulin aspart (NOVOLOG FLEXPEN) 100 UNIT/ML FlexPen Inject 5 Units into the skin 3 (three) times daily with meals. Special instructions: If accu-check is >200 . Hold  for accu-check 200 or less Patient not taking: No sig reported    [provider]  insulin lispro (HUMALOG) 100 UNIT/ML injection Inject into the skin. Patient not taking: Reported on 02/23/2020 01/23/20   [provider]  magnesium hydroxide (MILK OF MAGNESIA) 400 MG/5ML suspension Take 30 mLs by mouth once. Patient not taking: No sig reported    [provider]  ondansetron (ZOFRAN) 4 MG tablet Take 1 tablet (4 mg total) by mouth every 8 (eight) hours as needed for nausea. Patient not taking: No sig reported 02/05/20   EmRoxan HockeyMD    Allergies    Hydrocodone and Mobic [meloxicam]  Review of Systems   Review of Systems  Unable to perform ROS: Dementia    Physical Exam Updated Vital Signs BP 124/76   Pulse 74   Temp (!) 100.4 F (38 C)   Resp (!) 25  Ht 5' 7"  (1.702 m)   Wt 85 kg   SpO2 97%   BMI 29.35 kg/m   Physical Exam Constitutional:      Appearance: He is well-developed.     Comments: Patient is awake, sleepy but easily arousable.  He was confused, not following commands.  HENT:     Head: Normocephalic.     Nose: Nose normal.  Eyes:     Extraocular Movements: Extraocular movements intact.  Cardiovascular:     Rate and Rhythm: Normal rate.  Pulmonary:     Effort: Pulmonary effort is normal.  Skin:    Coloration: Skin is not jaundiced.  Neurological:     Comments: Patient appears fatigued, confused, not following directions.  Eyes are open, looking around.  Appears to be moving all extremities.     ED Results / Procedures / Treatments   Labs (all labs ordered are listed, but only abnormal results are displayed) Labs Reviewed  SARS CORONAVIRUS 2 BY RT PCR ([place]) - Abnormal; Notable for the following components:      Result Value   SARS Coronavirus 2 POSITIVE (*)    All other components within normal limits  COMPREHENSIVE METABOLIC PANEL - Abnormal; Notable for the following  components:   Sodium 128 (*)    Chloride 97 (*)    CO2 20 (*)    Glucose, Bld 186 (*)    Calcium 8.6 (*)    Albumin 2.7 (*)    All other components within normal limits  CBC - Abnormal; Notable for the following components:   WBC 15.6 (*)    RBC 3.44 (*)    Hemoglobin 10.6 (*)    HCT 32.9 (*)    Platelets 506 (*)    All other components within normal limits  URINALYSIS, ROUTINE W REFLEX MICROSCOPIC - Abnormal; Notable for the following components:   APPearance HAZY (*)    Glucose, UA >=500 (*)    Hgb urine dipstick SMALL (*)    Ketones, ur 20 (*)    Protein, ur 30 (*)    Bacteria, UA RARE (*)    All other components within normal limits  CULTURE, BLOOD (ROUTINE X 2)  CULTURE, BLOOD (ROUTINE X 2)  LACTIC ACID, PLASMA  LACTIC ACID, PLASMA    EKG EKG Interpretation  Date/Time:  Friday February 23 2020 15:40:02 EST Ventricular Rate:  77 PR Interval:    QRS Duration: 103 QT Interval:  378 QTC Calculation: 428 R Axis:   0 Text Interpretation: Sinus rhythm Abnormal R-wave progression, early transition Confirmed by Thamas Jaegers ([id number]) on 02/23/2020 6:22:20 PM   Radiology CT Head Wo Contrast  Result Date: 02/23/2020 CLINICAL DATA:  Altered level of consciousness, dementia, somnolence EXAM: CT HEAD WITHOUT CONTRAST TECHNIQUE: Contiguous axial images were obtained from the base of the skull through the vertex without intravenous contrast. COMPARISON:  02/11/2020 FINDINGS: Brain: Confluent hypodensities throughout the periventricular and subcortical white matter are stable, consistent with chronic small vessel ischemic changes. No signs of acute infarct or hemorrhage. Lateral ventricles and midline structures are stable. No acute extra-axial fluid collections. No mass effect. Stable cerebral cortical atrophy. Vascular: No hyperdense vessel or unexpected calcification. Skull: Normal. Negative for fracture or focal lesion. Sinuses/Orbits: No acute finding. Other: None. IMPRESSION: 1.  Stable chronic small-vessel ischemic changes. No acute intracranial process. Electronically Signed   By: Randa Ngo M.D.   On: 02/23/2020 18:24   DG Chest [facility] 1 View  Result  Date: 02/23/2020 CLINICAL DATA:  Fever EXAM: PORTABLE CHEST 1 VIEW COMPARISON:  02/11/2020 FINDINGS: Low lung volumes. No focal consolidation. No pleural effusion or pneumothorax. Stable cardiomediastinal silhouette. No acute osseous abnormality. IMPRESSION: No active disease. Electronically Signed   By: Kathreen Devoid   On: 02/23/2020 16:22    Procedures Procedures   Medications Ordered in ED Medications  acetaminophen (TYLENOL) 160 MG/5ML solution 650 mg (650 mg Oral Given 02/23/20 1618)    ED Course  I have reviewed the triage vital signs and the nursing notes.  Pertinent labs & imaging results that were available during my care of the patient were reviewed by me and considered in my medical decision making (see chart for details).    MDM Rules/Calculators/A&P                          Patient's Covid test is returning positive again.  He has a white count elevation of 15.  Urinalysis and remainder of exam appears unremarkable.  Patient given Tylenol here.  I spoke with his family members and discussed his case.  Need admission for him.  They prefer to take him home and take care of him at home given his dementia history.  Family states that other family members have come down with fever and congestion today as well.   Final Clinical Impression(s) / ED Diagnoses Final diagnoses:  COVID-19 virus infection    Rx / DC Orders ED Discharge Orders    None       Luna Fuse, MD 02/23/20 [phone]

## 2020-02-23 NOTE — ED Triage Notes (Signed)
Pt here via [facility] from home, was d/c'd from here 3 days ago, pt reports increased sleepiness which is unusual per family. Hx of dementia, non verbal & non ambulatory. Wears 2l N/c @ home. Pt states increased confusion. CBG 226 & RR 36 via EMS. Covid last Nov.

## 2020-02-23 NOTE — ED Notes (Signed)
Patient transported to CT 

## 2020-02-23 NOTE — ED Notes (Signed)
Pt resting in bed, discharged awaiting transport home. Daughter [name] called at CB# [phone], discharge instructions explained, covid status discussed, pt's daughter verbalizes understanding. Daughter reports she will be home awaiting transport arrival to address on chart.

## 2020-02-28 LAB — CULTURE, BLOOD (ROUTINE X 2)
Culture: NO GROWTH
Culture: NO GROWTH
Special Requests: ADEQUATE
Special Requests: ADEQUATE

## 2020-03-01 ENCOUNTER — Ambulatory Visit: Payer: Medicare Other | Admitting: Urology

## 2020-03-20 DIAGNOSIS — N189 Chronic kidney disease, unspecified: Secondary | ICD-10-CM | POA: Diagnosis not present

## 2020-03-20 DIAGNOSIS — M1990 Unspecified osteoarthritis, unspecified site: Secondary | ICD-10-CM | POA: Diagnosis not present

## 2020-03-20 DIAGNOSIS — G311 Senile degeneration of brain, not elsewhere classified: Secondary | ICD-10-CM | POA: Diagnosis not present

## 2020-03-20 DIAGNOSIS — J449 Chronic obstructive pulmonary disease, unspecified: Secondary | ICD-10-CM | POA: Diagnosis not present

## 2021-07-24 IMAGING — CT CT HEAD W/O CM
4 series · 16 of 47 positions shown, 18 images · non-contrast
Comparison: 01/09/2020

CLINICAL DATA: Altered mental status

EXAM:
CT HEAD WITHOUT CONTRAST
TECHNIQUE: Contiguous axial images were obtained from the base of the skull
through the vertex without intravenous contrast.

[Series 2: head w o · axial · 0.42mm/px · z∈[+1474,+1594]mm · 7 of 33 slices shown, 9 images]
[im 5/33  brain]
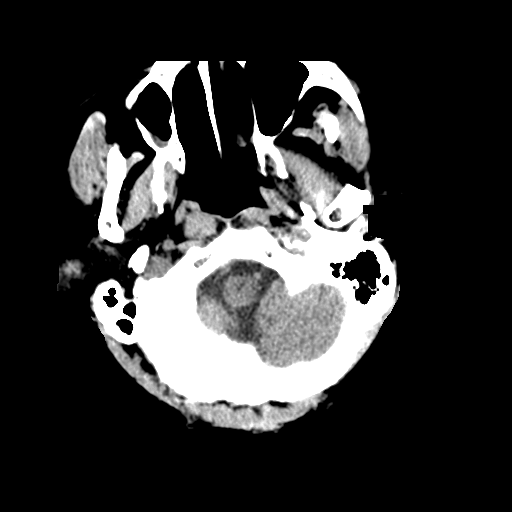
[im 5/33  bone]
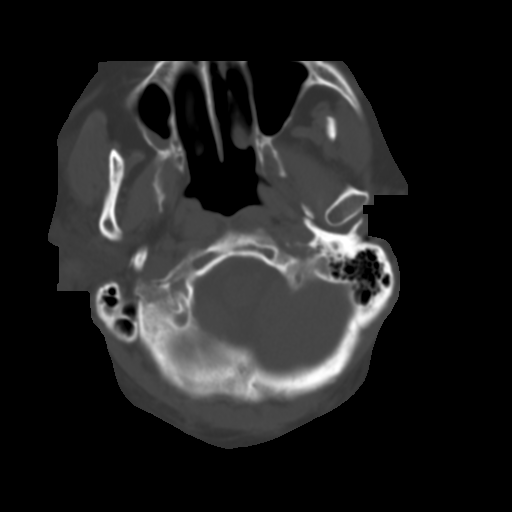
[im 9/33  brain]
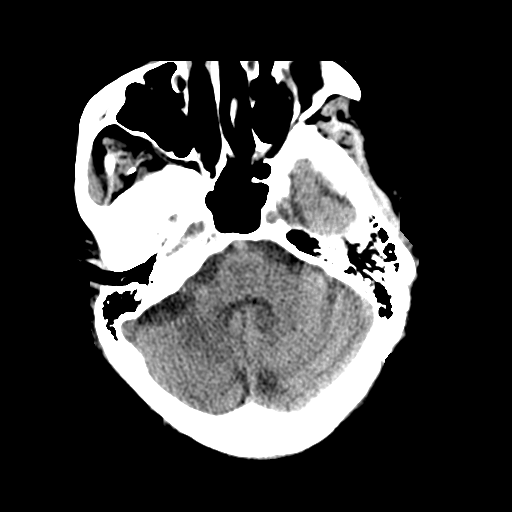
[im 13/33  brain]
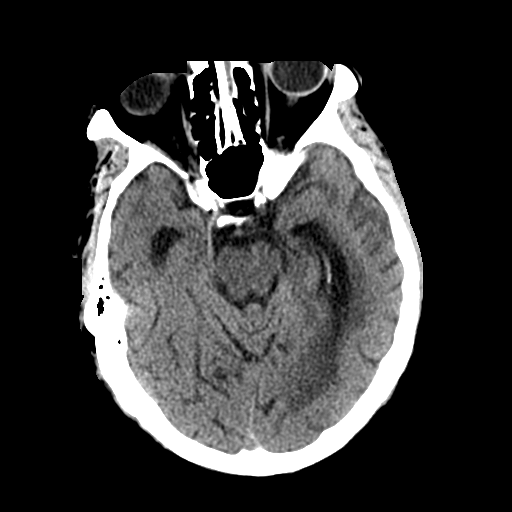
[im 17/33  brain]
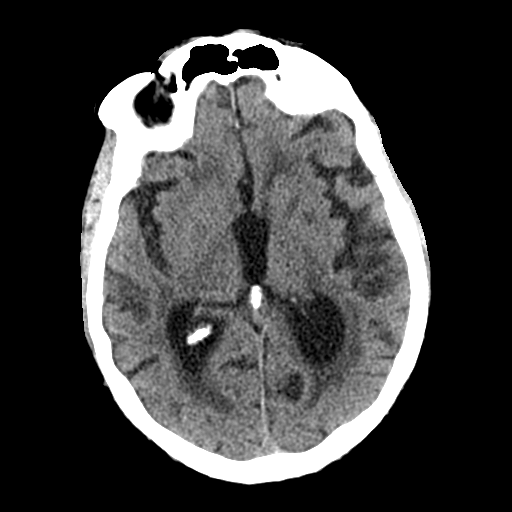
[im 21/33  brain]
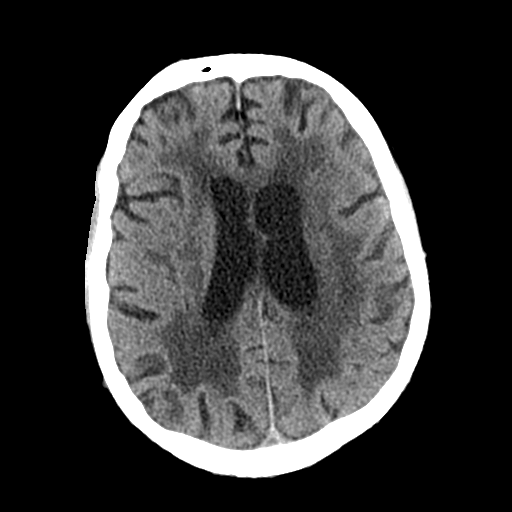
[im 21/33  bone]
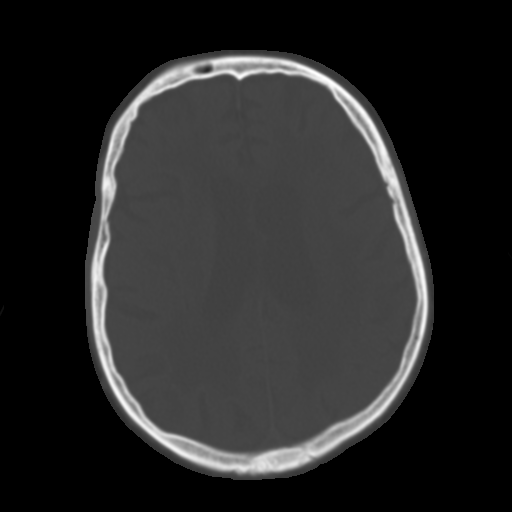
[im 25/33  brain]
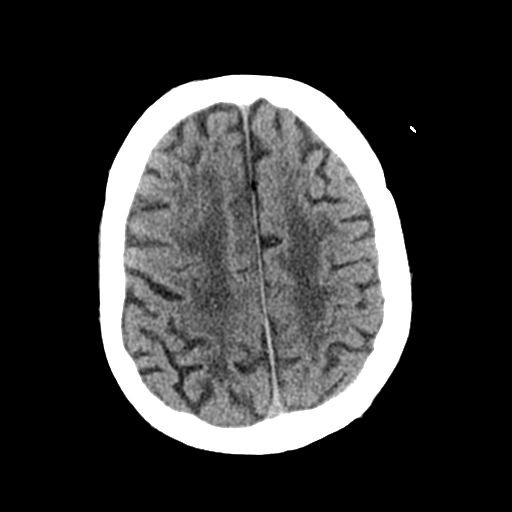
[im 29/33  brain]
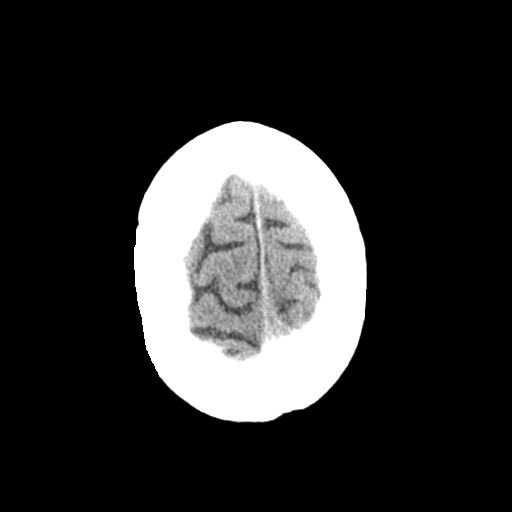

[Series 3: head bone · axial · 0.42mm/px · z∈[+1470,+1502]mm · 3 of 83 slices shown]
[im 9/83  bone]
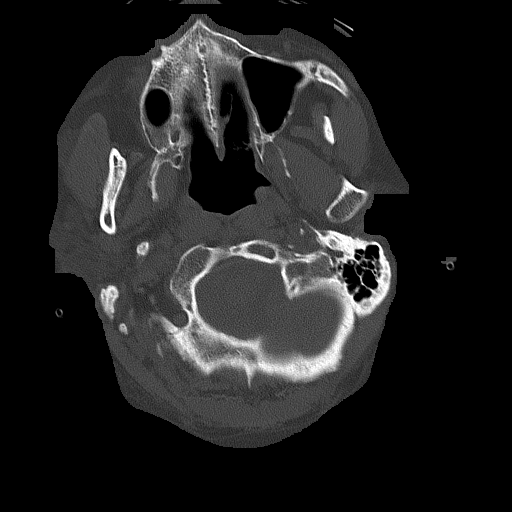
[im 17/83  bone]
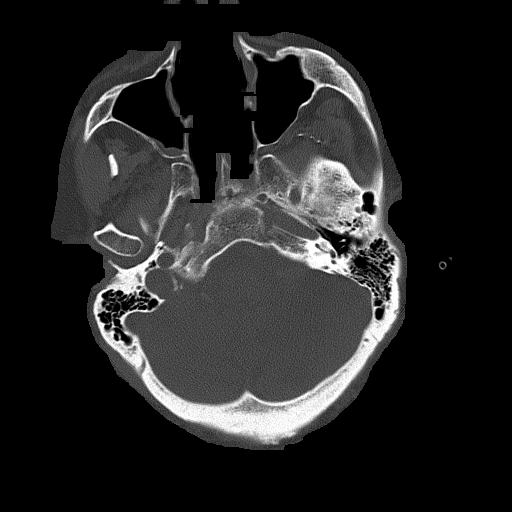
[im 25/83  bone]
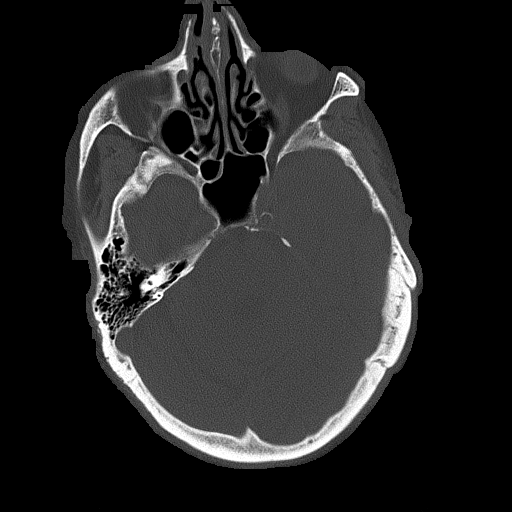

[Series 4: coronal soft · coronal · 0.33mm/px · 3 of 84 slices shown]
[im 28/84  brain]
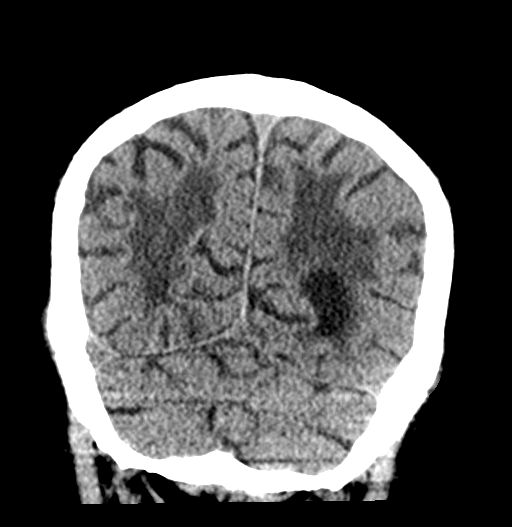
[im 37/84  brain]
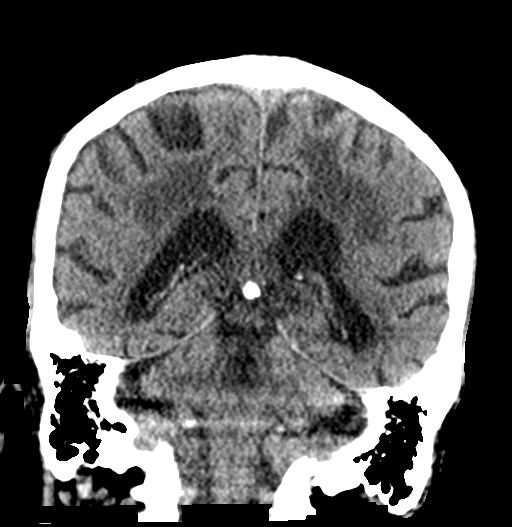
[im 47/84  brain]
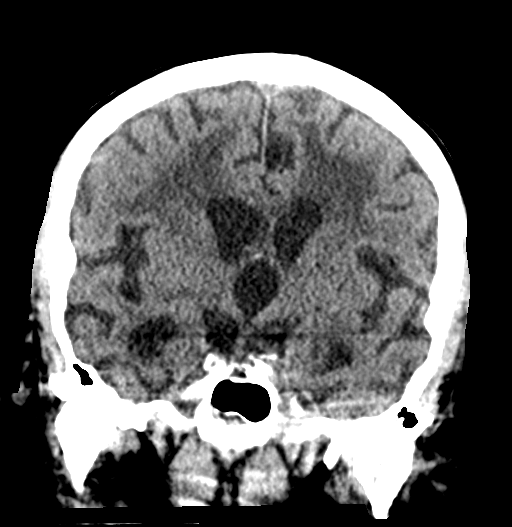

[Series 5: sagittal soft · sagittal · 0.37mm/px · 3 of 61 slices shown]
[im 21/61  brain]
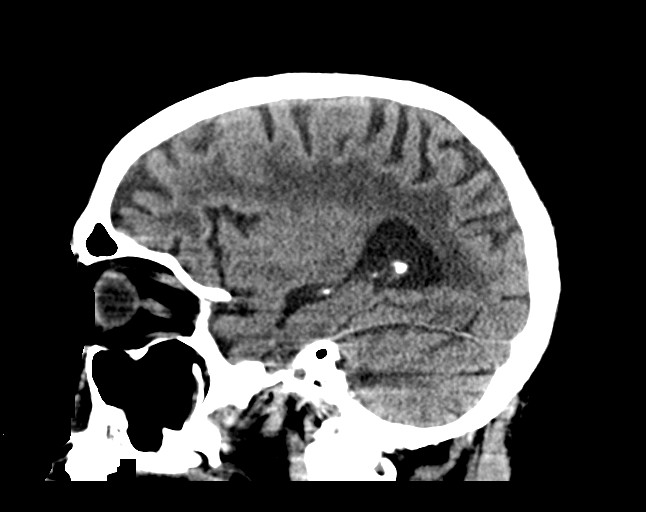
[im 31/61  brain]
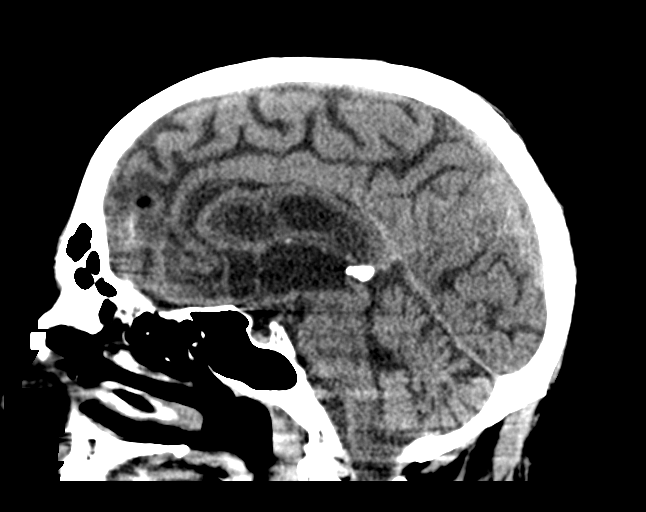
[im 41/61  brain]
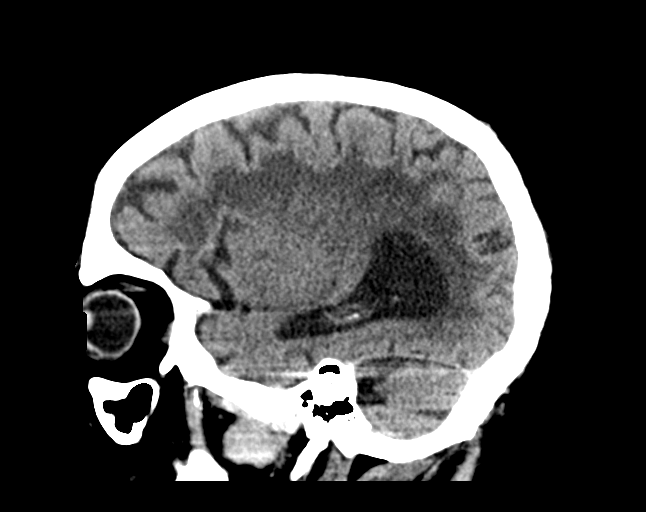

[16 of 47 positions shown; findings below may reference images not displayed]

FINDINGS: Brain: There is no acute intracranial hemorrhage, mass effect, or
edema. Gray-white differentiation is preserved. There is no
extra-axial fluid collection. Prominence of the ventricles and sulci
reflects stable generalized parenchymal volume loss. Patchy and
confluent areas of hypoattenuation in the supratentorial white
matter nonspecific but probably reflects stable advanced chronic
microvascular ischemic changes.

Vascular: There is atherosclerotic calcification at the skull base.

Skull: Calvarium is unremarkable.

Sinuses/Orbits: Mild mucosal thickening.  Orbits are unremarkable.

Other: None.
IMPRESSION: No acute intracranial hemorrhage, mass effect, or evidence of acute
infarction. Advanced chronic microvascular ischemic changes.

## 2021-08-16 IMAGING — DX DG CHEST 1V PORT
1 series · 1 of 1 positions shown · non-contrast
Comparison: 02/11/2020

CLINICAL DATA: Fever

EXAM:
PORTABLE CHEST 1 VIEW

[chest ap]
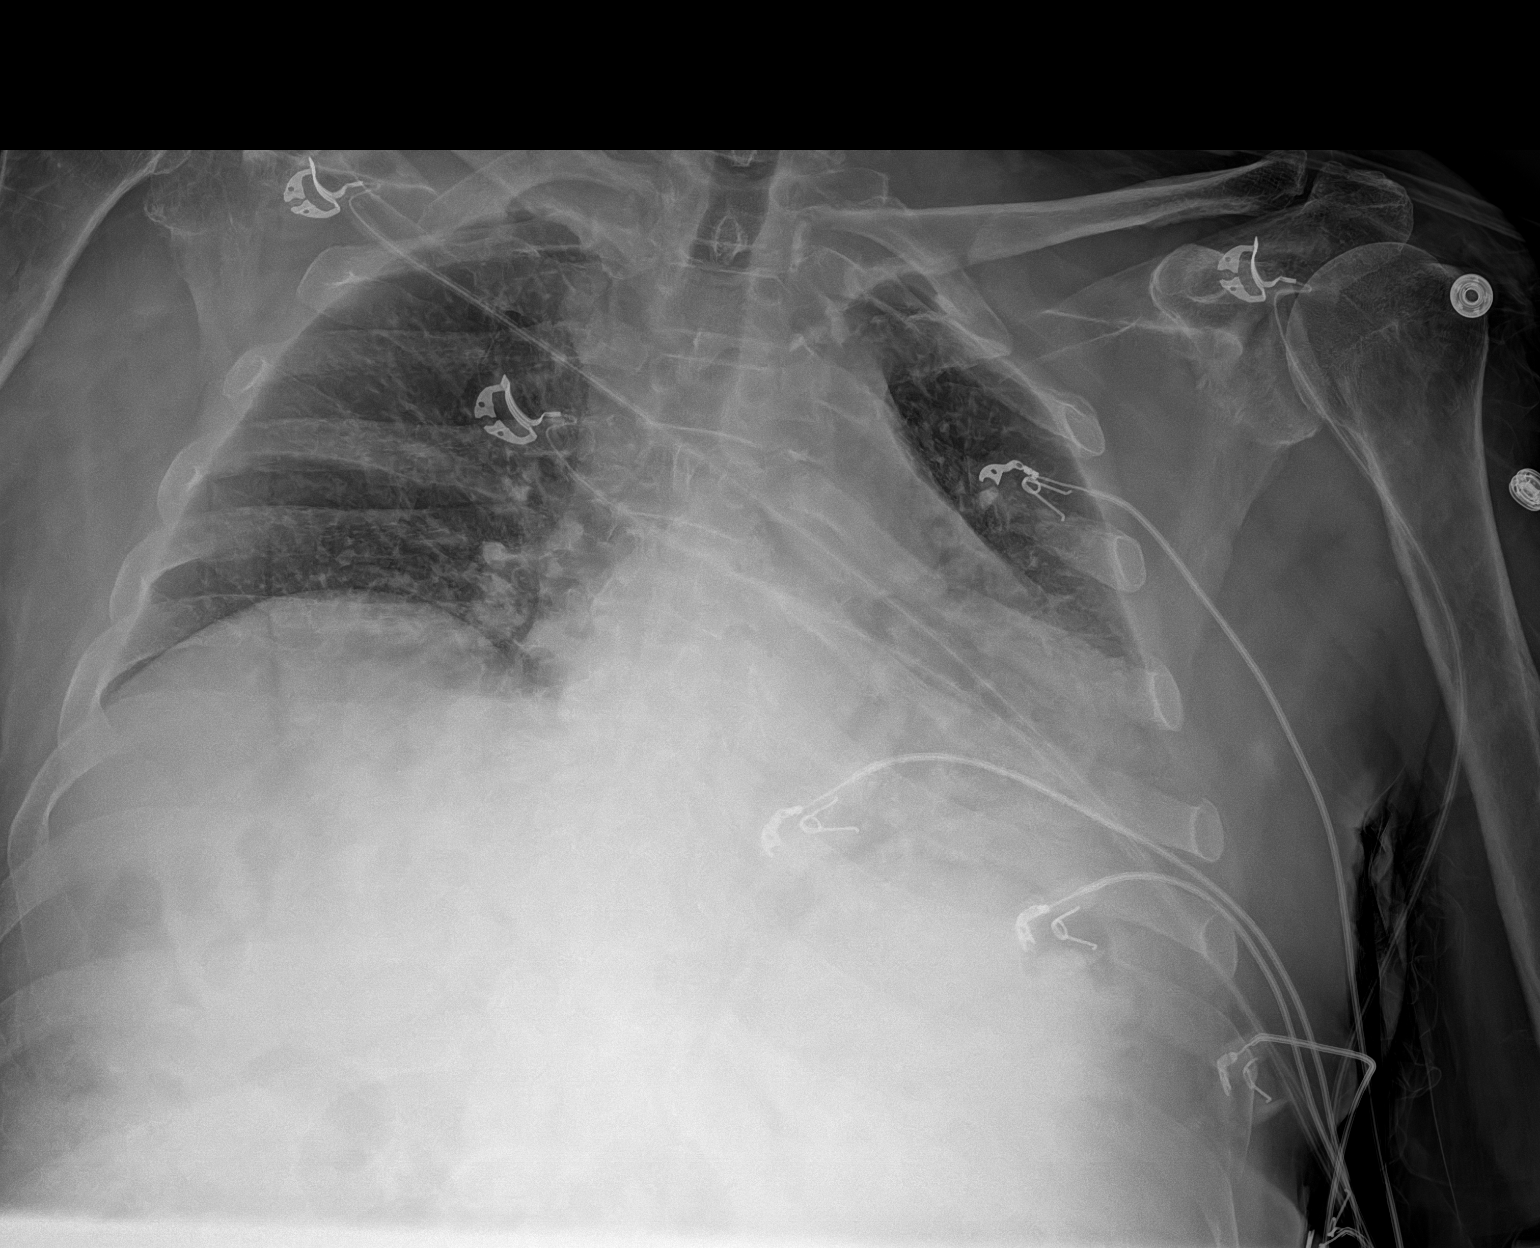

[1 of 1 positions shown; findings below may reference images not displayed]

FINDINGS: Low lung volumes. No focal consolidation. No pleural effusion or
pneumothorax. Stable cardiomediastinal silhouette.

No acute osseous abnormality.
IMPRESSION: No active disease.
